# Patient Record
Sex: Female | Born: 1940 | Race: White | Hispanic: No | Smoking: Former smoker
Health system: Southern US, Community
[De-identification: ages and names within clinical notes are randomized; demographics above are authoritative.]

## PROBLEM LIST (undated history)

## (undated) DIAGNOSIS — M1990 Unspecified osteoarthritis, unspecified site: Secondary | ICD-10-CM

## (undated) DIAGNOSIS — F329 Major depressive disorder, single episode, unspecified: Secondary | ICD-10-CM

## (undated) DIAGNOSIS — C801 Malignant (primary) neoplasm, unspecified: Secondary | ICD-10-CM

## (undated) DIAGNOSIS — Z9289 Personal history of other medical treatment: Secondary | ICD-10-CM

## (undated) DIAGNOSIS — I10 Essential (primary) hypertension: Secondary | ICD-10-CM

## (undated) DIAGNOSIS — Z87442 Personal history of urinary calculi: Secondary | ICD-10-CM

## (undated) DIAGNOSIS — D869 Sarcoidosis, unspecified: Secondary | ICD-10-CM

## (undated) DIAGNOSIS — C4491 Basal cell carcinoma of skin, unspecified: Secondary | ICD-10-CM

## (undated) DIAGNOSIS — E669 Obesity, unspecified: Secondary | ICD-10-CM

## (undated) DIAGNOSIS — J189 Pneumonia, unspecified organism: Secondary | ICD-10-CM

## (undated) DIAGNOSIS — G629 Polyneuropathy, unspecified: Secondary | ICD-10-CM

## (undated) DIAGNOSIS — C20 Malignant neoplasm of rectum: Secondary | ICD-10-CM

## (undated) DIAGNOSIS — J45909 Unspecified asthma, uncomplicated: Secondary | ICD-10-CM

## (undated) DIAGNOSIS — F419 Anxiety disorder, unspecified: Secondary | ICD-10-CM

## (undated) DIAGNOSIS — N289 Disorder of kidney and ureter, unspecified: Secondary | ICD-10-CM

## (undated) DIAGNOSIS — F32A Depression, unspecified: Secondary | ICD-10-CM

## (undated) DIAGNOSIS — I447 Left bundle-branch block, unspecified: Secondary | ICD-10-CM

## (undated) DIAGNOSIS — F341 Dysthymic disorder: Secondary | ICD-10-CM

## (undated) DIAGNOSIS — E785 Hyperlipidemia, unspecified: Secondary | ICD-10-CM

## (undated) DIAGNOSIS — Z8249 Family history of ischemic heart disease and other diseases of the circulatory system: Secondary | ICD-10-CM

## (undated) DIAGNOSIS — H269 Unspecified cataract: Secondary | ICD-10-CM

## (undated) DIAGNOSIS — N189 Chronic kidney disease, unspecified: Secondary | ICD-10-CM

## (undated) HISTORY — DX: Unspecified cataract: H26.9

## (undated) HISTORY — PX: EYE SURGERY: SHX253

## (undated) HISTORY — DX: Disorder of kidney and ureter, unspecified: N28.9

## (undated) HISTORY — PX: COLONOSCOPY: SHX174

## (undated) HISTORY — DX: Essential (primary) hypertension: I10

## (undated) HISTORY — DX: Basal cell carcinoma of skin, unspecified: C44.91

## (undated) HISTORY — DX: Family history of ischemic heart disease and other diseases of the circulatory system: Z82.49

## (undated) HISTORY — DX: Dysthymic disorder: F34.1

## (undated) HISTORY — DX: Sarcoidosis, unspecified: D86.9

## (undated) HISTORY — DX: Chronic kidney disease, unspecified: N18.9

## (undated) HISTORY — DX: Hyperlipidemia, unspecified: E78.5

## (undated) HISTORY — DX: Obesity, unspecified: E66.9

## (undated) HISTORY — PX: SIGMOIDOSCOPY: SUR1295

## (undated) HISTORY — DX: Major depressive disorder, single episode, unspecified: F32.9

## (undated) HISTORY — DX: Left bundle-branch block, unspecified: I44.7

## (undated) HISTORY — DX: Depression, unspecified: F32.A

---
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
## 1977-11-18 HISTORY — PX: TUBAL LIGATION: SHX77

## 1984-11-18 HISTORY — PX: APPENDECTOMY: SHX54

## 1984-11-18 HISTORY — PX: ABDOMINAL HYSTERECTOMY: SHX81

## 1993-11-18 HISTORY — PX: KNEE ARTHROSCOPY: SUR90

## 2007-05-27 ENCOUNTER — Ambulatory Visit: Payer: Self-pay | Admitting: Family Medicine

## 2007-05-29 ENCOUNTER — Encounter: Admission: RE | Admit: 2007-05-29 | Discharge: 2007-05-29 | Payer: Self-pay | Admitting: Family Medicine

## 2007-06-01 ENCOUNTER — Ambulatory Visit: Payer: Self-pay | Admitting: Family Medicine

## 2007-06-02 LAB — HM MAMMOGRAPHY: HM Mammogram: NEGATIVE

## 2007-08-17 ENCOUNTER — Ambulatory Visit: Payer: Self-pay | Admitting: Family Medicine

## 2007-09-21 ENCOUNTER — Ambulatory Visit: Payer: Self-pay | Admitting: Family Medicine

## 2007-10-21 ENCOUNTER — Ambulatory Visit: Payer: Self-pay | Admitting: Family Medicine

## 2007-10-30 ENCOUNTER — Ambulatory Visit: Payer: Self-pay | Admitting: Family Medicine

## 2007-11-20 ENCOUNTER — Ambulatory Visit: Payer: Self-pay | Admitting: Family Medicine

## 2007-11-20 ENCOUNTER — Encounter: Admission: RE | Admit: 2007-11-20 | Discharge: 2007-11-20 | Payer: Self-pay | Admitting: Family Medicine

## 2008-02-03 ENCOUNTER — Ambulatory Visit: Payer: Self-pay | Admitting: Family Medicine

## 2008-08-18 ENCOUNTER — Ambulatory Visit: Payer: Self-pay | Admitting: Family Medicine

## 2008-10-20 ENCOUNTER — Ambulatory Visit: Payer: Self-pay | Admitting: Family Medicine

## 2008-12-21 ENCOUNTER — Ambulatory Visit: Payer: Self-pay | Admitting: Family Medicine

## 2010-05-16 ENCOUNTER — Ambulatory Visit: Payer: Self-pay | Admitting: Family Medicine

## 2010-06-21 ENCOUNTER — Ambulatory Visit: Payer: Self-pay | Admitting: Family Medicine

## 2010-06-22 ENCOUNTER — Encounter: Admission: RE | Admit: 2010-06-22 | Discharge: 2010-06-22 | Payer: Self-pay | Admitting: Family Medicine

## 2010-08-21 ENCOUNTER — Ambulatory Visit: Payer: Self-pay | Admitting: Family Medicine

## 2011-05-13 ENCOUNTER — Telehealth: Payer: Self-pay | Admitting: Family Medicine

## 2011-05-13 DIAGNOSIS — I10 Essential (primary) hypertension: Secondary | ICD-10-CM

## 2011-05-13 MED ORDER — BISOPROLOL-HYDROCHLOROTHIAZIDE 10-6.25 MG PO TABS: 1.0000 | ORAL_TABLET | Freq: Every day | ORAL | Status: DC

## 2011-05-14 NOTE — Telephone Encounter (Signed)
RX CALLED IN BY [facility]

## 2011-05-16 ENCOUNTER — Other Ambulatory Visit: Payer: Self-pay | Admitting: *Deleted

## 2011-05-16 DIAGNOSIS — I10 Essential (primary) hypertension: Secondary | ICD-10-CM

## 2011-05-16 MED ORDER — BISOPROLOL-HYDROCHLOROTHIAZIDE 10-6.25 MG PO TABS: 1.0000 | ORAL_TABLET | Freq: Every day | ORAL | Status: DC

## 2011-06-26 ENCOUNTER — Telehealth: Payer: Self-pay | Admitting: Family Medicine

## 2011-06-26 DIAGNOSIS — I10 Essential (primary) hypertension: Secondary | ICD-10-CM

## 2011-06-26 MED ORDER — BISOPROLOL-HYDROCHLOROTHIAZIDE 10-6.25 MG PO TABS: 1.0000 | ORAL_TABLET | Freq: Every day | ORAL | Status: DC

## 2011-06-26 NOTE — Telephone Encounter (Signed)
The med called in

## 2011-07-01 ENCOUNTER — Encounter: Payer: Self-pay | Admitting: Family Medicine

## 2011-07-02 ENCOUNTER — Ambulatory Visit (INDEPENDENT_AMBULATORY_CARE_PROVIDER_SITE_OTHER): Payer: Medicare Other | Admitting: Family Medicine

## 2011-07-02 ENCOUNTER — Encounter: Payer: Self-pay | Admitting: Family Medicine

## 2011-07-02 ENCOUNTER — Encounter: Payer: Self-pay | Admitting: Internal Medicine

## 2011-07-02 VITALS — BP 126/80 | HR 67 | Wt 219.0 lb

## 2011-07-02 DIAGNOSIS — Z8249 Family history of ischemic heart disease and other diseases of the circulatory system: Secondary | ICD-10-CM | POA: Insufficient documentation

## 2011-07-02 DIAGNOSIS — E785 Hyperlipidemia, unspecified: Secondary | ICD-10-CM | POA: Insufficient documentation

## 2011-07-02 DIAGNOSIS — Z79899 Other long term (current) drug therapy: Secondary | ICD-10-CM

## 2011-07-02 DIAGNOSIS — Z1211 Encounter for screening for malignant neoplasm of colon: Secondary | ICD-10-CM

## 2011-07-02 DIAGNOSIS — I10 Essential (primary) hypertension: Secondary | ICD-10-CM

## 2011-07-02 DIAGNOSIS — E669 Obesity, unspecified: Secondary | ICD-10-CM

## 2011-07-02 DIAGNOSIS — F341 Dysthymic disorder: Secondary | ICD-10-CM

## 2011-07-02 NOTE — Progress Notes (Signed)
  Subjective:    Patient ID: Samantha Jenkins, female    DOB: July 06, 1941, 70 y.o.   MRN: [id number]  HPI She is here for a medication recheck. She has had difficulty in the past with statins causing myalgias. She has not tried Crestor. Continues on medications listed in the chart. She has not had a recent mammogram or colonoscopy in the last 10 years. She continues on Zoloft and is not interested in stopping. She feels very comfortable on this medication. She states it helps keep her stable. She has no particular concerns or complaints.   Review of Systems Negative except as above    Objective:   Physical Exam alert and in no distress. Tympanic membranes and canals are normal. Throat is clear. Tonsils are normal. Neck is supple without adenopathy or thyromegaly. Cardiac exam shows a regular sinus rhythm without murmurs or gallops. Lungs are clear to auscultation.        Assessment & Plan:  Hypertension. Hyperlipidemia. Family history of heart disease. Obesity. Dysthymia. Routine blood screening, mammogram and colonoscopy.

## 2011-07-03 ENCOUNTER — Telehealth: Payer: Self-pay

## 2011-07-03 LAB — COMPREHENSIVE METABOLIC PANEL
ALT: 16 U/L (ref 0–35)
AST: 23 U/L (ref 0–37)
Albumin: 4.6 g/dL (ref 3.5–5.2)
Alkaline Phosphatase: 95 U/L (ref 39–117)
BUN: 19 mg/dL (ref 6–23)
CO2: 26 mEq/L (ref 19–32)
Calcium: 10.4 mg/dL (ref 8.4–10.5)
Chloride: 107 mEq/L (ref 96–112)
Creat: 0.78 mg/dL (ref 0.50–1.10)
Glucose, Bld: 104 mg/dL — ABNORMAL HIGH (ref 70–99)
Potassium: 4.1 mEq/L (ref 3.5–5.3)
Sodium: 144 mEq/L (ref 135–145)
Total Bilirubin: 0.8 mg/dL (ref 0.3–1.2)
Total Protein: 7.1 g/dL (ref 6.0–8.3)

## 2011-07-03 LAB — CBC WITH DIFFERENTIAL/PLATELET
Basophils Absolute: 0 10*3/uL (ref 0.0–0.1)
Basophils Relative: 0 % (ref 0–1)
Eosinophils Absolute: 0.1 10*3/uL (ref 0.0–0.7)
Eosinophils Relative: 1 % (ref 0–5)
HCT: 45 % (ref 36.0–46.0)
Hemoglobin: 15 g/dL (ref 12.0–15.0)
Lymphocytes Relative: 29 % (ref 12–46)
Lymphs Abs: 2.1 10*3/uL (ref 0.7–4.0)
MCH: 31.8 pg (ref 26.0–34.0)
MCHC: 33.3 g/dL (ref 30.0–36.0)
MCV: 95.3 fL (ref 78.0–100.0)
Monocytes Absolute: 0.5 10*3/uL (ref 0.1–1.0)
Monocytes Relative: 7 % (ref 3–12)
Neutro Abs: 4.5 10*3/uL (ref 1.7–7.7)
Neutrophils Relative %: 63 % (ref 43–77)
Platelets: 305 10*3/uL (ref 150–400)
RBC: 4.72 MIL/uL (ref 3.87–5.11)
RDW: 14.1 % (ref 11.5–15.5)
WBC: 7.2 10*3/uL (ref 4.0–10.5)

## 2011-07-03 LAB — LIPID PANEL
Cholesterol: 246 mg/dL — ABNORMAL HIGH (ref 0–200)
HDL: 54 mg/dL (ref >39)
LDL Cholesterol: 162 mg/dL — ABNORMAL HIGH (ref 0–99)
Total CHOL/HDL Ratio: 4.6 Ratio
Triglycerides: 151 mg/dL — ABNORMAL HIGH (ref <150)
VLDL: 30 mg/dL (ref 0–40)

## 2011-07-03 NOTE — Telephone Encounter (Signed)
Called pt and left message labs look good and mailed her a copy

## 2011-07-04 ENCOUNTER — Telehealth: Payer: Self-pay

## 2011-07-04 NOTE — Telephone Encounter (Signed)
Let pt know paper ready to pick up

## 2011-07-18 ENCOUNTER — Encounter: Payer: Self-pay | Admitting: Internal Medicine

## 2011-07-18 ENCOUNTER — Ambulatory Visit (AMBULATORY_SURGERY_CENTER): Payer: Medicare Other | Admitting: *Deleted

## 2011-07-18 VITALS — Ht 65.0 in | Wt 220.6 lb

## 2011-07-18 DIAGNOSIS — Z1211 Encounter for screening for malignant neoplasm of colon: Secondary | ICD-10-CM

## 2011-07-18 MED ORDER — SUPREP BOWEL PREP KIT 17.5-3.13-1.6 GM/177ML PO SOLN: 1.0000 | Freq: Once | ORAL | Status: DC

## 2011-07-27 ENCOUNTER — Other Ambulatory Visit: Payer: Self-pay | Admitting: Family Medicine

## 2011-07-31 ENCOUNTER — Other Ambulatory Visit: Payer: Medicare Other | Admitting: Internal Medicine

## 2011-09-04 ENCOUNTER — Other Ambulatory Visit: Payer: Self-pay | Admitting: Family Medicine

## 2011-10-23 ENCOUNTER — Other Ambulatory Visit: Payer: Self-pay | Admitting: Family Medicine

## 2011-10-23 NOTE — Telephone Encounter (Signed)
Is this ok?

## 2011-11-11 ENCOUNTER — Encounter: Payer: Self-pay | Admitting: Family Medicine

## 2011-11-11 ENCOUNTER — Ambulatory Visit (INDEPENDENT_AMBULATORY_CARE_PROVIDER_SITE_OTHER): Payer: Medicare Other | Admitting: Family Medicine

## 2011-11-11 VITALS — BP 140/80 | HR 85 | Temp 98.3°F | Resp 14 | Wt 216.0 lb

## 2011-11-11 DIAGNOSIS — J45909 Unspecified asthma, uncomplicated: Secondary | ICD-10-CM

## 2011-11-11 MED ORDER — AZITHROMYCIN 250 MG PO TABS: ORAL_TABLET | ORAL | Status: AC

## 2011-11-11 MED ORDER — ALBUTEROL SULFATE HFA 108 (90 BASE) MCG/ACT IN AERS: 2.0000 | INHALATION_SPRAY | Freq: Four times a day (QID) | RESPIRATORY_TRACT | Status: DC | PRN

## 2011-11-11 NOTE — Patient Instructions (Signed)
Drink plenty of fluids.  Continue Mucinex to loosen phlegm and advil for fever control.  You may use a cough suppressant such as Delsym (as long as your Mucinex is the plain kind, and not DM) Use the inhaler if needed for wheezing.  Follow up here if increasing shortness of breath, ongoing fevers, or other concerns

## 2011-11-11 NOTE — Progress Notes (Signed)
Patient complains of cough x 2-3 days.  Cough was dry at first, but now is rattling, and having burning in her upper chest when she coughs.  Unable to expectorate phlegm, but feels like she needs to, sounds wet.  Has been using Mucinex (which makes her thirsty, so having increased urinary frequency due to increased fluid intake).  Last night had coughing spells where she felt like she couldn't catch her breath.  Had fever of 102 yesterday.  Denies runny nose, sinus pressure, ear pain.  Slight sore throat.  Denies sick contacts. She used to get pneumonia frequently as a child, and as an adult gets bronchitis frequently.  Didn't get a flu shot yet this year.  Past Medical History  Diagnosis Date  . Hypertension   . Vitamin D deficiency   . Hyperlipidemia   . Sarcoid   . Dysthymia   . Obesity   . LBBB (left bundle branch block)   . FHx: cardiovascular disease   . BCE (basal cell epithelioma)     Past Surgical History  Procedure Date  . Abdominal hysterectomy 1986  . Knee arthroscopy 1995    left  . Tubal ligation 1979  . Tonsillectomy 1945    History   Social History  . Marital Status: Divorced    Spouse Name: N/A    Number of Children: N/A  . Years of Education: N/A   Occupational History  . Not on file.   Social History Main Topics  . Smoking status: Former Smoker    Quit date: 07/18/1967  . Smokeless tobacco: Never Used  . Alcohol Use: 1.8 oz/week    3 Glasses of wine per week  . Drug Use: No  . Sexually Active: Not on file   Other Topics Concern  . Not on file   Social History Narrative  . No narrative on file    Family History  Problem Relation Age of Onset  . Colon cancer Neg Hx   . Stomach cancer Neg Hx   . Rectal cancer Neg Hx     Current outpatient prescriptions:aspirin 81 MG tablet, Take 81 mg by mouth daily.  , Disp: , Rfl: ;  bisoprolol-hydrochlorothiazide (ZIAC) 5-6.25 MG per tablet, Take 1 tablet by mouth daily.  , Disp: , Rfl: ;  Cholecalciferol  (VITAMIN D) 2000 UNITS CAPS, Take by mouth.  , Disp: , Rfl: ;  ibuprofen (ADVIL,MOTRIN) 200 MG tablet, Take 200 mg by mouth every 6 (six) hours as needed.  , Disp: , Rfl:  Ibuprofen-Diphenhydramine Cit (ADVIL PM PO), Take by mouth. As needed for sleep , Disp: , Rfl: ;  pravastatin (PRAVACHOL) 20 MG tablet, Take 20 mg by mouth daily.  , Disp: , Rfl: ;  sertraline (ZOLOFT) 100 MG tablet, TAKE 1 TABLET BY MOUTH EVERY DAY, Disp: 30 tablet, Rfl: 5;  calcium-vitamin D (OSCAL WITH D) 250-125 MG-UNIT per tablet, Take 1 tablet by mouth daily.  , Disp: , Rfl:   Allergies  Allergen Reactions  . Statins     Muscle aches  . Sulfa Antibiotics Other (See Comments)    Blood in urine   ROS: Denies nausea, vomiting, diarrhea, skin rashes, joint pains or other problems.  Denies myalgias.   BP 140/80  Pulse 85  Temp(Src) 98.3 F (36.8 C) (Oral)  Resp 14  Wt 216 lb (97.977 kg)  SpO2 95%  HEENT:  PERRL, EOMI, conjunctiva clear.  TM's and EAC's normal.  OP clear.  Nasal mucosa mildly edematous with clear mucus.  Sinuses nontender Neck: no lymphadenopathy or mass Heart: regular rate and rhythm without murmur Lungs: coarse breath sounds, more on L than R.  Good air movement.  Some wheezing on L noted Skin: no rash Psych: normal mood, affect, hygiene and grooming  ASSESSMENT/PLAN: 1. Asthmatic bronchitis  azithromycin (ZITHROMAX) 250 MG tablet, albuterol (PROVENTIL HFA;VENTOLIN HFA) 108 (90 BASE) MCG/ACT inhaler   zpak Albuterol MDI prn  F/u prn

## 2012-07-29 ENCOUNTER — Other Ambulatory Visit: Payer: Self-pay | Admitting: Family Medicine

## 2012-07-29 NOTE — Telephone Encounter (Signed)
Patient needs refill sertraline 100mg   [facility]

## 2012-07-30 ENCOUNTER — Other Ambulatory Visit: Payer: Self-pay

## 2012-07-30 MED ORDER — SERTRALINE HCL 100 MG PO TABS: 100.0000 mg | ORAL_TABLET | Freq: Every day | ORAL | Status: DC

## 2012-07-30 NOTE — Telephone Encounter (Signed)
Have her set up a followup appointment 

## 2012-07-30 NOTE — Telephone Encounter (Signed)
Sent med in 

## 2012-09-11 ENCOUNTER — Telehealth: Payer: Self-pay | Admitting: Internal Medicine

## 2012-09-11 ENCOUNTER — Other Ambulatory Visit: Payer: Self-pay | Admitting: Medical

## 2012-09-11 MED ORDER — SERTRALINE HCL 100 MG PO TABS: 100.0000 mg | ORAL_TABLET | Freq: Every day | ORAL | Status: DC

## 2012-09-11 NOTE — Telephone Encounter (Signed)
Pt is coming in for a med check October 29 but has ran out of zoloft 100mg  and needs a refill to get her by til then. [facility] [address]

## 2012-09-14 NOTE — Telephone Encounter (Signed)
done

## 2012-09-15 ENCOUNTER — Ambulatory Visit (INDEPENDENT_AMBULATORY_CARE_PROVIDER_SITE_OTHER): Payer: Medicare Other | Admitting: Family Medicine

## 2012-09-15 ENCOUNTER — Encounter: Payer: Self-pay | Admitting: Family Medicine

## 2012-09-15 VITALS — BP 130/82 | HR 60 | Ht 65.0 in | Wt 216.0 lb

## 2012-09-15 DIAGNOSIS — Z79899 Other long term (current) drug therapy: Secondary | ICD-10-CM

## 2012-09-15 DIAGNOSIS — E785 Hyperlipidemia, unspecified: Secondary | ICD-10-CM

## 2012-09-15 DIAGNOSIS — E559 Vitamin D deficiency, unspecified: Secondary | ICD-10-CM

## 2012-09-15 DIAGNOSIS — F341 Dysthymic disorder: Secondary | ICD-10-CM

## 2012-09-15 DIAGNOSIS — E669 Obesity, unspecified: Secondary | ICD-10-CM

## 2012-09-15 DIAGNOSIS — Z23 Encounter for immunization: Secondary | ICD-10-CM

## 2012-09-15 DIAGNOSIS — I10 Essential (primary) hypertension: Secondary | ICD-10-CM

## 2012-09-15 DIAGNOSIS — Z8249 Family history of ischemic heart disease and other diseases of the circulatory system: Secondary | ICD-10-CM

## 2012-09-15 LAB — COMPREHENSIVE METABOLIC PANEL
ALT: 16 U/L (ref 0–35)
AST: 19 U/L (ref 0–37)
Albumin: 4.3 g/dL (ref 3.5–5.2)
Alkaline Phosphatase: 109 U/L (ref 39–117)
BUN: 15 mg/dL (ref 6–23)
CO2: 26 mEq/L (ref 19–32)
Calcium: 10.2 mg/dL (ref 8.4–10.5)
Chloride: 105 mEq/L (ref 96–112)
Creat: 0.66 mg/dL (ref 0.50–1.10)
Glucose, Bld: 89 mg/dL (ref 70–99)
Potassium: 4.1 mEq/L (ref 3.5–5.3)
Sodium: 140 mEq/L (ref 135–145)
Total Bilirubin: 0.7 mg/dL (ref 0.3–1.2)
Total Protein: 7 g/dL (ref 6.0–8.3)

## 2012-09-15 LAB — LIPID PANEL
Cholesterol: 211 mg/dL — ABNORMAL HIGH (ref 0–200)
HDL: 54 mg/dL (ref >39)
LDL Cholesterol: 121 mg/dL — ABNORMAL HIGH (ref 0–99)
Total CHOL/HDL Ratio: 3.9 Ratio
Triglycerides: 179 mg/dL — ABNORMAL HIGH (ref <150)
VLDL: 36 mg/dL (ref 0–40)

## 2012-09-15 LAB — CBC WITH DIFFERENTIAL/PLATELET
Basophils Absolute: 0 10*3/uL (ref 0.0–0.1)
Basophils Relative: 0 % (ref 0–1)
Eosinophils Absolute: 0.1 10*3/uL (ref 0.0–0.7)
Eosinophils Relative: 2 % (ref 0–5)
HCT: 44 % (ref 36.0–46.0)
Hemoglobin: 14.7 g/dL (ref 12.0–15.0)
Lymphocytes Relative: 28 % (ref 12–46)
Lymphs Abs: 1.8 10*3/uL (ref 0.7–4.0)
MCH: 31.6 pg (ref 26.0–34.0)
MCHC: 33.4 g/dL (ref 30.0–36.0)
MCV: 94.6 fL (ref 78.0–100.0)
Monocytes Absolute: 0.6 10*3/uL (ref 0.1–1.0)
Monocytes Relative: 9 % (ref 3–12)
Neutro Abs: 3.9 10*3/uL (ref 1.7–7.7)
Neutrophils Relative %: 61 % (ref 43–77)
Platelets: 262 10*3/uL (ref 150–400)
RBC: 4.65 MIL/uL (ref 3.87–5.11)
RDW: 13.5 % (ref 11.5–15.5)
WBC: 6.4 10*3/uL (ref 4.0–10.5)

## 2012-09-15 MED ORDER — INFLUENZA VIRUS VACC SPLIT PF IM SUSP: 0.5000 mL | Freq: Once | INTRAMUSCULAR | Status: DC

## 2012-09-15 NOTE — Progress Notes (Signed)
  Subjective:    Patient ID: Samantha Jenkins, female    DOB: 25-Sep-1941, 71 y.o.   MRN: [id number]  HPI She is here for medication check. She continues on medications listed in the chart for her blood pressure as well as lipids and dysthymia. She is having no difficulty with these medications and would like to stay on them. She states that she has lost a slight amount of weight and plans to continue to do this. He also has a history of vitamin D deficiency and is on vitamin D supplementation. She uses the Advil PM very sparingly. She has seen her cardiologist within the last year.   Review of Systems     Objective:   Physical Exam alert and in no distress. Tympanic membranes and canals are normal. Throat is clear. Tonsils are normal. Neck is supple without adenopathy or thyromegaly. Cardiac exam shows a regular sinus rhythm without murmurs or gallops. Lungs are clear to auscultation.        Assessment & Plan:   1. Need for prophylactic vaccination and inoculation against influenza  influenza  inactive virus vaccine (FLUZONE/FLUARIX) injection 0.5 mL  2. Obesity (BMI 30-39.9)  Lipid panel, CBC with Differential, Comprehensive metabolic panel  3. Hypertension  CBC with Differential, Comprehensive metabolic panel  4. Hyperlipidemia LDL goal <100  Lipid panel  5. Dysthymia    6. Family history of heart disease in female family member before age 23  Lipid panel, CBC with Differential, Comprehensive metabolic panel  7. Vitamin D deficiency  Vitamin D 25 hydroxy  8. Encounter for long-term (current) use of other medications  Lipid panel, CBC with Differential, Comprehensive metabolic panel   flu shot given with risks and benefits discussed I encouraged her to continue with her weight loss program. She is not interested in having another colonoscopy.

## 2012-09-16 LAB — VITAMIN D 25 HYDROXY (VIT D DEFICIENCY, FRACTURES): Vit D, 25-Hydroxy: 29 ng/mL — ABNORMAL LOW (ref 30–89)

## 2012-10-09 ENCOUNTER — Telehealth: Payer: Self-pay | Admitting: Internal Medicine

## 2012-10-09 MED ORDER — SERTRALINE HCL 100 MG PO TABS: 100.0000 mg | ORAL_TABLET | Freq: Every day | ORAL | Status: DC

## 2012-10-09 NOTE — Telephone Encounter (Signed)
Pt needed a refill on zoloft sent to [facility]

## 2012-11-18 DIAGNOSIS — I447 Left bundle-branch block, unspecified: Secondary | ICD-10-CM

## 2012-11-18 HISTORY — DX: Left bundle-branch block, unspecified: I44.7

## 2013-03-09 ENCOUNTER — Telehealth: Payer: Self-pay | Admitting: Family Medicine

## 2013-03-09 MED ORDER — SERTRALINE HCL 100 MG PO TABS: 100.0000 mg | ORAL_TABLET | Freq: Every day | ORAL | Status: DC

## 2013-03-09 NOTE — Telephone Encounter (Signed)
Pt called and stated she was completely out of zoloft she would like a 30 day supply called into a local pharmacy [facility] [address], and a 90 day supply sent into her mail order [facility].

## 2013-03-09 NOTE — Telephone Encounter (Signed)
Zoloft renewed.

## 2013-03-09 NOTE — Telephone Encounter (Signed)
Is this ok?

## 2013-03-22 ENCOUNTER — Other Ambulatory Visit: Payer: Self-pay | Admitting: *Deleted

## 2013-03-22 DIAGNOSIS — F32A Depression, unspecified: Secondary | ICD-10-CM

## 2013-03-22 DIAGNOSIS — F329 Major depressive disorder, single episode, unspecified: Secondary | ICD-10-CM

## 2013-03-22 MED ORDER — SERTRALINE HCL 100 MG PO TABS: 100.0000 mg | ORAL_TABLET | Freq: Every day | ORAL | Status: DC

## 2013-04-06 ENCOUNTER — Encounter: Payer: Self-pay | Admitting: Emergency Medicine

## 2013-04-08 ENCOUNTER — Ambulatory Visit (INDEPENDENT_AMBULATORY_CARE_PROVIDER_SITE_OTHER): Payer: Medicare Other | Admitting: Internal Medicine

## 2013-04-08 ENCOUNTER — Other Ambulatory Visit: Payer: Self-pay | Admitting: *Deleted

## 2013-04-08 ENCOUNTER — Encounter: Payer: Self-pay | Admitting: Internal Medicine

## 2013-04-08 VITALS — BP 158/88 | HR 54 | Ht 66.0 in | Wt 220.0 lb

## 2013-04-08 DIAGNOSIS — E8881 Metabolic syndrome: Secondary | ICD-10-CM

## 2013-04-08 DIAGNOSIS — E782 Mixed hyperlipidemia: Secondary | ICD-10-CM

## 2013-04-08 DIAGNOSIS — I447 Left bundle-branch block, unspecified: Secondary | ICD-10-CM | POA: Insufficient documentation

## 2013-04-08 DIAGNOSIS — Z79899 Other long term (current) drug therapy: Secondary | ICD-10-CM

## 2013-04-08 DIAGNOSIS — Z8249 Family history of ischemic heart disease and other diseases of the circulatory system: Secondary | ICD-10-CM

## 2013-04-08 DIAGNOSIS — E785 Hyperlipidemia, unspecified: Secondary | ICD-10-CM

## 2013-04-08 DIAGNOSIS — E669 Obesity, unspecified: Secondary | ICD-10-CM

## 2013-04-08 DIAGNOSIS — I10 Essential (primary) hypertension: Secondary | ICD-10-CM

## 2013-04-08 DIAGNOSIS — F341 Dysthymic disorder: Secondary | ICD-10-CM

## 2013-04-08 MED ORDER — BISOPROLOL-HYDROCHLOROTHIAZIDE 5-6.25 MG PO TABS: 1.0000 | ORAL_TABLET | Freq: Every day | ORAL | Status: DC

## 2013-04-08 MED ORDER — PRAVASTATIN SODIUM 40 MG PO TABS: 40.0000 mg | ORAL_TABLET | Freq: Every day | ORAL | Status: DC

## 2013-04-08 NOTE — Patient Instructions (Addendum)
Your physician recommends that you continue on your current medications as directed. Please refer to the Current Medication list given to you today.   Your physician wants you to follow-up in: 12 months. You will receive a reminder letter in the mail two months in advance. If you don't receive a letter, please call our office to schedule the follow-up appointment.  Your physician recommends that you go for lab work  -CMP,NMR with lipid  Cardiac Diet A cardiac diet can help stop heart disease or a stroke from happening. It involves eating less unhealthy fats and eating more healthy fats.  FOODS TO AVOID OR LIMIT  Limit saturated fats. This type of fat is found in oils and dairy products, such as:  Coconut oil.  Palm oil.  Cocoa butter.  Butter.  Avoid trans-fat or hydrogenated oils. These are found in fried or pre-made baked goods, such as:  Margarine.  Pre-made cookies, cakes, and crackers.  Limit processed meats (hot dogs, deli meats, sausage) to 3 ounces a week.  Limit high-fat meats (marbled meats, fried chicken, or chicken with skin) to 3 ounces a week.  Limit salt (sodium) to 1500 milligrams a day.   Limit sweets and drinks with added sugar to no more than 5 servings a week. One serving is:  1 tablespoon of sugar.  1 tablespoon of jelly or jam.   cup sorbet.  1 cup lemonade.   cup regular soda. EAT MORE OF THE FOLLOWING FOODS Fruit  Eat 4to 5 servings a day. One serving of fruit is:  1 medium whole fruit.   cup dried fruit.   cup of fresh, frozen, or canned fruit.   cup 100% fruit juice. Vegetables  Eat 4 to 5 servings a day. One serving is:  1 cup raw leafy vegetables.   cup raw or cooked, cut-up vegetables.   cup vegetable juice. Whole Grains  Eat 3 servings a day (1 ounce equals 1 serving). Legumes (such as beans, peas, and lentils)   Eat at least 4 servings a week ( cup equals 1 serving). Nuts and Seeds   Eat at least 4  servings a week ( cup equals 1 serving). Dietary Fiber  Eat 20 to 30 grams a day. Some foods high in dietary fiber include:  Dried beans.  Citrus fruits.  Apples, bananas.  Broccoli, Brussels sprouts, and eggplant.  Oats. Omega-3 Fats  Eat food with omega-3 fats. You can also take a dietary pill (supplement) that has 1 gram of DHA and EPA. Have 3.5 ounces of fatty fish a week, such as:  Salmon.  Mackerel.  [place]acore tuna.  Sardines.  Lake trout.  Herring. PREPARING YOUR FOOD  Broil, bake, steam, or roast foods. Do not fry food. Do not cook food in butter (fat).  Use non-stick cooking sprays.  Remove skin from poultry, such as chicken and [place].  Remove fat from meat.  Take the fat off the top of stews, soups, and gravy.  Use lemon or herbs to flavor food instead of using butter or margarine.  Use nonfat yogurt, salsa, or low-fat dressings for salads. Document Released: 05/05/2012 Document Reviewed: 05/05/2012 [facility] Patient Information 2014 [place], [place].

## 2013-04-08 NOTE — Progress Notes (Signed)
OFFICE NOTE  Chief Complaint:  Routine followup visit  Primary Care Physician: Carollee Herter, MD  HPI:  Samantha Jenkins is a 72 year old female with a history of osteoarthritis, hypertension, and mild depression; however, this has been fairly stable. She does have a history of a left bundle-branch block which is chronic and a prior nuclear stress test which was negative for ischemia, and she has no associated symptoms such as chest pain, shortness of breath, presyncope, syncope, palpitations, or dizziness. She also has dyslipidemia and has been intolerant to a number of statin medications before including Mevacor, Zocor, Welchol, Lipitor. In addition, she has hypertension which has been fairly well controlled. At her last office visit, we started her on pravastatin 40 mg daily, and she reports being able to tolerate that fairly well. I noted that you discovered she was severely low in vitamin D, which seems to been repleted. Overall she's been feeling well, except for the other day she tripped and fell at a gas station. She did strike her head and subsequently developed ecchymosis around the left orbit. She additionally complains of left-sided chest wall pain without bruising, and is concerned that she may have fractured a rib.  Nevertheless, she did not present for evaluation by a physician and is certainly outside of the window where serious complications from head injury would have been present.  PMHx:  Past Medical History  Diagnosis Date  . Hypertension   . Vitamin D deficiency   . Hyperlipidemia   . Sarcoid   . Dysthymia   . Obesity   . LBBB (left bundle branch block)   . FHx: cardiovascular disease   . BCE (basal cell epithelioma)   . Depression     Past Surgical History  Procedure Laterality Date  . Abdominal hysterectomy  1986  . Knee arthroscopy  1995    left  . Tubal ligation  1979  . Tonsillectomy  1945    FAMHx:  Family History  Problem Relation Age of Onset    . Colon cancer Neg Hx   . Stomach cancer Neg Hx   . Rectal cancer Neg Hx     SOCHx:   reports that she quit smoking about 45 years ago. She has never used smokeless tobacco. She reports that she drinks about 1.8 ounces of alcohol per week. She reports that she does not use illicit drugs.  ALLERGIES:  Allergies  Allergen Reactions  . Statins     Muscle aches  . Sulfa Antibiotics Other (See Comments)    Blood in urine    ROS: A comprehensive review of systems was negative except for: Constitutional: positive for weight gain Eyes: positive for Left periorbital ecchymosis Cardiovascular: positive for Chest wall pain Hematologic/lymphatic: positive for easy bruising Musculoskeletal: positive for Rib pain  HOME MEDS: Current Outpatient Prescriptions  Medication Sig Dispense Refill  . aspirin 81 MG tablet Take 81 mg by mouth daily.        . bisoprolol-hydrochlorothiazide (ZIAC) 5-6.25 MG per tablet Take 1 tablet by mouth daily.        . calcium-vitamin D (OSCAL WITH D) 250-125 MG-UNIT per tablet Take 1 tablet by mouth daily.        . Cholecalciferol (VITAMIN D) 2000 UNITS CAPS Take by mouth.        Marland Kitchen ibuprofen (ADVIL,MOTRIN) 200 MG tablet Take 200 mg by mouth every 6 (six) hours as needed.        . Ibuprofen-Diphenhydramine Cit (ADVIL PM PO) Take by mouth.  As needed for sleep       . pravastatin (PRAVACHOL) 40 MG tablet Take 40 mg by mouth daily.      . sertraline (ZOLOFT) 100 MG tablet Take 1 tablet (100 mg total) by mouth daily.  30 tablet  0   No current facility-administered medications for this visit.    LABS/IMAGING: No results found for this or any previous visit (from the past 48 hour(s)). No results found.  VITALS: BP 158/88  Pulse 54  Ht 5\' 6"  (1.676 m)  Wt 220 lb (99.791 kg)  BMI 35.53 kg/m2  EXAM: General appearance: alert and no distress Neck: no adenopathy, no carotid bruit, no JVD, supple, symmetrical, trachea midline and thyroid not enlarged, symmetric,  no tenderness/mass/nodules Lungs: clear to auscultation bilaterally Heart: regular rate and rhythm, S1, S2 normal, no murmur, click, rub or gallop Abdomen: soft, non-tender; bowel sounds normal; no masses,  no organomegaly and Obese Extremities: extremities normal, atraumatic, no cyanosis or edema and Left orbital ecchymosis, tenderness to palpation over the left anterior axillary line, no crepitus with inspiration or expiration Pulses: 2+ and symmetric Skin: Skin color, texture, turgor normal. No rashes or lesions Neurologic: Grossly normal, cranial nerves II through XII intact, strength 5/5 bilaterally upper and lower extremity, normal extraocular eye movements  EKG: Sinus bradycardia with left bundle branch block, QRS duration 156 ms, heart rate 54  ASSESSMENT: 1. Chronic left bundle branch block with a wide QRS 2. Left perioral ecchymosis secondary to head injury 3. Left anterior axillary line chest wall tenderness 4. Metabolic syndrome 5. Hyperlipidemia 6. Hypertension  PLAN: 1.   Overall Ms. Carver is doing well except for recent fall with some head trauma. She denies any loss of consciousness and has no focal neurologic deficits. Cranial nerves are intact. She does have some left anterior basilar wall tenderness, which may indicate a deep bruise or perhaps a rib fracture. I do not see any advantage of chest x-ray at this time. If her chest pain does not improve within one month then she should consider chest x-ray to look for incomplete healing. We'll go ahead and obtain laboratory work as well as a recheck of her cholesterol profile today. She's inquired about Mediterranean diet and we have pointed or in the direction of where she can get more information about that diet. Plan to see her back in the office annually.  Chrystie Nose, MD, [facility] Attending Cardiologist [facility]  Ambert Virrueta C 04/08/2013, 11:14 AM

## 2013-04-09 NOTE — Telephone Encounter (Signed)
PATIENT REQUEST REFILL. E-SCRBED MEDICATION

## 2013-08-19 ENCOUNTER — Ambulatory Visit (INDEPENDENT_AMBULATORY_CARE_PROVIDER_SITE_OTHER): Payer: Medicare Other | Admitting: Family Medicine

## 2013-08-19 ENCOUNTER — Encounter: Payer: Self-pay | Admitting: Family Medicine

## 2013-08-19 VITALS — BP 124/80 | HR 60 | Wt 218.0 lb

## 2013-08-19 DIAGNOSIS — I10 Essential (primary) hypertension: Secondary | ICD-10-CM

## 2013-08-19 DIAGNOSIS — E785 Hyperlipidemia, unspecified: Secondary | ICD-10-CM

## 2013-08-19 DIAGNOSIS — F341 Dysthymic disorder: Secondary | ICD-10-CM

## 2013-08-19 DIAGNOSIS — F32A Depression, unspecified: Secondary | ICD-10-CM

## 2013-08-19 DIAGNOSIS — Z8249 Family history of ischemic heart disease and other diseases of the circulatory system: Secondary | ICD-10-CM

## 2013-08-19 DIAGNOSIS — E8881 Metabolic syndrome: Secondary | ICD-10-CM

## 2013-08-19 DIAGNOSIS — F329 Major depressive disorder, single episode, unspecified: Secondary | ICD-10-CM

## 2013-08-19 DIAGNOSIS — Z23 Encounter for immunization: Secondary | ICD-10-CM

## 2013-08-19 DIAGNOSIS — E669 Obesity, unspecified: Secondary | ICD-10-CM

## 2013-08-19 LAB — CBC WITH DIFFERENTIAL/PLATELET
Basophils Absolute: 0 10*3/uL (ref 0.0–0.1)
Basophils Relative: 0 % (ref 0–1)
Eosinophils Absolute: 0.1 10*3/uL (ref 0.0–0.7)
Eosinophils Relative: 2 % (ref 0–5)
HCT: 42 % (ref 36.0–46.0)
Hemoglobin: 14.8 g/dL (ref 12.0–15.0)
Lymphocytes Relative: 26 % (ref 12–46)
Lymphs Abs: 1.5 10*3/uL (ref 0.7–4.0)
MCH: 32.7 pg (ref 26.0–34.0)
MCHC: 35.2 g/dL (ref 30.0–36.0)
MCV: 92.9 fL (ref 78.0–100.0)
Monocytes Absolute: 0.4 10*3/uL (ref 0.1–1.0)
Monocytes Relative: 7 % (ref 3–12)
Neutro Abs: 3.8 10*3/uL (ref 1.7–7.7)
Neutrophils Relative %: 65 % (ref 43–77)
Platelets: 280 10*3/uL (ref 150–400)
RBC: 4.52 MIL/uL (ref 3.87–5.11)
RDW: 14.3 % (ref 11.5–15.5)
WBC: 5.9 10*3/uL (ref 4.0–10.5)

## 2013-08-19 LAB — COMPREHENSIVE METABOLIC PANEL
ALT: 14 U/L (ref 0–35)
AST: 19 U/L (ref 0–37)
Albumin: 3.8 g/dL (ref 3.5–5.2)
Alkaline Phosphatase: 96 U/L (ref 39–117)
BUN: 15 mg/dL (ref 6–23)
CO2: 26 mEq/L (ref 19–32)
Calcium: 9.5 mg/dL (ref 8.4–10.5)
Chloride: 107 mEq/L (ref 96–112)
Creat: 0.64 mg/dL (ref 0.50–1.10)
Glucose, Bld: 101 mg/dL — ABNORMAL HIGH (ref 70–99)
Potassium: 3.8 mEq/L (ref 3.5–5.3)
Sodium: 141 mEq/L (ref 135–145)
Total Bilirubin: 0.8 mg/dL (ref 0.3–1.2)
Total Protein: 6.5 g/dL (ref 6.0–8.3)

## 2013-08-19 LAB — LIPID PANEL
Cholesterol: 174 mg/dL (ref 0–200)
HDL: 50 mg/dL (ref >39)
LDL Cholesterol: 100 mg/dL — ABNORMAL HIGH (ref 0–99)
Total CHOL/HDL Ratio: 3.5 Ratio
Triglycerides: 119 mg/dL (ref <150)
VLDL: 24 mg/dL (ref 0–40)

## 2013-08-19 MED ORDER — SERTRALINE HCL 100 MG PO TABS: 100.0000 mg | ORAL_TABLET | Freq: Every day | ORAL | Status: DC

## 2013-08-19 MED ORDER — PRAVASTATIN SODIUM 40 MG PO TABS: 40.0000 mg | ORAL_TABLET | Freq: Every day | ORAL | Status: DC

## 2013-08-19 MED ORDER — BISOPROLOL-HYDROCHLOROTHIAZIDE 5-6.25 MG PO TABS: 1.0000 | ORAL_TABLET | Freq: Every day | ORAL | Status: DC

## 2013-08-19 NOTE — Progress Notes (Signed)
  Subjective:    Patient ID: Samantha Jenkins, female    DOB: 08-18-1941, 72 y.o.   MRN: [id number]  HPI She is here for a general medication check. She continues on her blood pressure medication and is having no difficulty with this. Review his record indicates elevated blood sugar. She continues on Pravachol and is having no difficulty with this. She also takes Ziac for her blood pressure. She does get followup with cardiology yearly. There is a family history of heart disease. She did smoke over 45 years ago. She continues to do well on her Zoloft for treatment of an underlying dysthymia. She states that it helps keep her calm. She states that she does not handle stress well. She does not exercise regularly. Social and family history were reviewed and are unchanged.  Review of Systems Negative except as above    Objective:   Physical Exam alert and in no distress. Tympanic membranes and canals are normal. Throat is clear. Tonsils are normal. Neck is supple without adenopathy or thyromegaly. Cardiac exam shows a regular sinus rhythm without murmurs or gallops. Lungs are clear to auscultation.        Assessment & Plan:  Immunization due - Plan: Pneumococcal polysaccharide vaccine 23-valent greater than or equal to 2yo subcutaneous/IM  Need for prophylactic vaccination and inoculation against influenza - Plan: Flu Vaccine QUAD 36+ mos IM  Obesity (BMI 30-39.9) - Plan: CBC with Differential, Comprehensive metabolic panel, Lipid panel  Metabolic syndrome - Plan: CBC with Differential, Comprehensive metabolic panel, Lipid panel  Hypertension - Plan: CBC with Differential, Comprehensive metabolic panel, bisoprolol-hydrochlorothiazide (ZIAC) 5-6.25 MG per tablet  Hyperlipidemia LDL goal <100 - Plan: Lipid panel, pravastatin (PRAVACHOL) 40 MG tablet  Family history of heart disease in female family member before age 58  Dysthymia - Plan: sertraline (ZOLOFT) 100 MG tablet  Depression - Plan:  sertraline (ZOLOFT) 100 MG tablet  pneumococcal vaccine as well as flu shot given with risks and benefits discussed. Routine blood screening. Renew Zoloft and Pravachol as well as a Ziac. Discussed colonoscopy with her and at this point she is not interested. She indeed did get a shingles vaccine several years ago. 40 minutes spent discussing all these issues with her

## 2013-09-29 ENCOUNTER — Encounter: Payer: Self-pay | Admitting: *Deleted

## 2013-11-06 ENCOUNTER — Telehealth: Payer: Self-pay | Admitting: Family Medicine

## 2013-11-06 NOTE — Telephone Encounter (Signed)
lm

## 2014-02-02 ENCOUNTER — Ambulatory Visit (INDEPENDENT_AMBULATORY_CARE_PROVIDER_SITE_OTHER): Payer: Commercial Managed Care - HMO | Admitting: Family Medicine

## 2014-02-02 ENCOUNTER — Encounter: Payer: Self-pay | Admitting: Family Medicine

## 2014-02-02 VITALS — BP 130/80 | HR 64 | Wt 217.0 lb

## 2014-02-02 DIAGNOSIS — M79642 Pain in left hand: Secondary | ICD-10-CM

## 2014-02-02 DIAGNOSIS — M79609 Pain in unspecified limb: Secondary | ICD-10-CM

## 2014-02-02 NOTE — Progress Notes (Signed)
   Subjective:    Patient ID: Samantha Jenkins, female    DOB: April 24, 1941, 73 y.o.   MRN: [id number]  HPI She is here for evaluation of a one-month history of bilateral hand numbness. She does state that it occurs more on the left than on the right. She states it is all fingers. She cannot relate this to wrist or elbow position. She's had no neck pain. She does note that when she reads in bed and gets these symptoms and then shakes her hands, symptoms go a fairly quickly.   Review of Systems     Objective:   Physical Exam Exam of both hands shows normal strength and sensation. Tinel and Phalen's test negative.       Assessment & Plan:  Hand pain, left  I explained that most likely her symptoms are related to carpal tunnel but also discussed ulnar nerve issues as well as neck trouble. Recommend she keep better track of these symptoms to see which if anything ears are involved and wrist as well as elbow position. Discussed keeping her wrist in the neutral position.

## 2014-03-04 ENCOUNTER — Encounter: Payer: Self-pay | Admitting: Family Medicine

## 2014-03-04 ENCOUNTER — Ambulatory Visit (INDEPENDENT_AMBULATORY_CARE_PROVIDER_SITE_OTHER): Payer: Commercial Managed Care - HMO | Admitting: Family Medicine

## 2014-03-04 VITALS — BP 138/74 | HR 60 | Temp 98.0°F | Wt 215.0 lb

## 2014-03-04 DIAGNOSIS — J069 Acute upper respiratory infection, unspecified: Secondary | ICD-10-CM

## 2014-03-04 DIAGNOSIS — J309 Allergic rhinitis, unspecified: Secondary | ICD-10-CM

## 2014-03-04 DIAGNOSIS — J302 Other seasonal allergic rhinitis: Secondary | ICD-10-CM

## 2014-03-04 MED ORDER — ALBUTEROL SULFATE HFA 108 (90 BASE) MCG/ACT IN AERS: 2.0000 | INHALATION_SPRAY | Freq: Four times a day (QID) | RESPIRATORY_TRACT | Status: AC | PRN

## 2014-03-04 NOTE — Progress Notes (Deleted)
   Subjective:    Patient ID: Samantha Jenkins, female    DOB: [birth year], 73 y.o.   MRN: [id number]  HPI  The patient was in her usual state of health until "all of a sudden" on Monday the patient began to loose her voice, have a non-productive cough, sinus congestion, wheezing with deep breaths, runny nose, sensation of ear fullness, sore throat and itchy watery eyes. The patient also reports lack of sleep in the last few days due to persistent cough and one instance of night sweats. The patient's nasal and eye discharge are clear and watery. The patient denies any fever, nausea, vomiting, dizziness, sinus pain, headache, toothache and muscles aches. The patient has tried taking advil which did not improve her symptoms. The patient has a history of late onset asthma, but her albuterol inhaler is out of date, and she hasn't used it.   The patient also continues to have residual left thumb numbness. The numbness began two months ago at the tip and progressed downwards starting several months ago. The sensation feels like pins and needles without pain. The sensation in her thumb has also changed a little and now feels strange comparative to the other thumb. Overall the patient has been getting better, but it isn't yet fully resolved.   Review of Systems is negative except per HPI.    Objective:   Physical Exam  Constitutional: Patient is oriented to person, place, and time and well-developed, well-nourished, and in no distress. HENT: Head: Normocephalic and atraumatic. Mouth/Throat: Oropharynx is erythematous. Nasal mucosa is boggy and pink.  Eyes: Conjunctivae non-injected and EOM are normal. Pupils are equal, round, and reactive to light.  Ears: Right TM intact and normal in appearance, Left TM more erythematous, non-bulging. Cardiovascular: Normal rate, regular rhythm.  Pulmonary/Chest: Effort normal and breath sounds normal. No respiratory distress. No wheezes or ronchi.    Assessment & Plan:    Acute URI  Allergic rhinitis, seasonal - Plan: albuterol (PROVENTIL HFA;VENTOLIN HFA) 108 (90 BASE) MCG/ACT inhaler  This most likely represents a URI compounded with allergic rhinitis. Recommend rest, fluids and albuterol inhaler for treatment of cough.

## 2014-03-04 NOTE — Progress Notes (Signed)
   Subjective:    Patient ID: Samantha Jenkins, female    DOB: 30-Sep-1941, 73 y.o.   MRN: [id number]  Cough    The patient was in her usual state of health until "all of a sudden" on Monday the patient began to loose her voice, have a non-productive cough, sinus congestion, wheezing with deep breaths, runny nose, sensation of ear fullness, sore throat and itchy watery eyes. The patient also reports lack of sleep in the last few days due to persistent cough and one instance of night sweats. The patient's nasal and eye discharge are clear and watery. The patient denies any fever, nausea, vomiting, dizziness, sinus pain, headache, toothache and muscles aches. The patient has tried taking advil which did not improve her symptoms. The patient has a history of late onset asthma, but her albuterol inhaler is out of date, and she hasn't used it. She relates that she gets allergy symptoms every spring but rarely uses any medications for it.  The patient also continues to have residual left thumb numbness. The numbness began two months ago at the tip and progressed downwards starting several months ago. The sensation feels like pins and needles without pain. The sensation in her thumb has also changed a little and now feels strange comparative to the other thumb. Overall the patient has been getting better, but it isn't yet fully resolved.   Review of Systems  Respiratory: Positive for cough.    is negative except per HPI.    Objective:   Physical Exam  Constitutional: Patient is oriented to person, place, and time and well-developed, well-nourished, and in no distress. HENT: Head: Normocephalic and atraumatic. Mouth/Throat: Oropharynx is erythematous. Nasal mucosa is boggy and pink.  Eyes: Conjunctivae non-injected and EOM are normal. Pupils are equal, round, and reactive to light.  Ears: Right TM intact and normal in appearance, Left TM more erythematous, non-bulging. Cardiovascular: Normal rate, regular  rhythm.  Pulmonary/Chest: Effort normal and breath sounds normal. No respiratory distress. No wheezes or ronchi.    Assessment & Plan:  Acute URI  Allergic rhinitis, seasonal - Plan: albuterol (PROVENTIL HFA;VENTOLIN HFA) 108 (90 BASE) MCG/ACT inhaler  This most likely represents a URI compounded with allergic rhinitis. Recommend rest, fluids and albuterol inhaler for treatment of cough. Also recommended she try either Allegra or Claritin to help with her allergy symptoms.

## 2014-03-08 ENCOUNTER — Ambulatory Visit (INDEPENDENT_AMBULATORY_CARE_PROVIDER_SITE_OTHER): Payer: Commercial Managed Care - HMO | Admitting: Family Medicine

## 2014-03-08 ENCOUNTER — Encounter: Payer: Self-pay | Admitting: Family Medicine

## 2014-03-08 VITALS — BP 130/90 | HR 80

## 2014-03-08 DIAGNOSIS — H109 Unspecified conjunctivitis: Secondary | ICD-10-CM

## 2014-03-08 DIAGNOSIS — J209 Acute bronchitis, unspecified: Secondary | ICD-10-CM

## 2014-03-08 DIAGNOSIS — H6693 Otitis media, unspecified, bilateral: Secondary | ICD-10-CM

## 2014-03-08 DIAGNOSIS — H6690 Otitis media, unspecified, unspecified ear: Secondary | ICD-10-CM

## 2014-03-08 MED ORDER — AMOXICILLIN 875 MG PO TABS: 875.0000 mg | ORAL_TABLET | Freq: Two times a day (BID) | ORAL | Status: DC

## 2014-03-08 NOTE — Patient Instructions (Signed)
Take all the medicine and if not totally back to normal when you finish give me a call and I will call in more

## 2014-03-08 NOTE — Progress Notes (Signed)
   Subjective:    Patient ID: Samantha Jenkins, female    DOB: [birth year], 73 y.o.   MRN: [id number]  HPI She is here for recheck. Her throat is causing her a lot of discomfort as well as ear pain she is also having some drainage from left eye. She also complains of nasal congestion, PND, chest a dry cough.   Review of Systems     Objective:   Physical Exam alert and in no distress. Drainage noted in the left eye. Tympanic membranes were slightly pink, canals are normal. Throat is clear. Tonsils are normal. Neck is supple without adenopathy or thyromegaly. Cardiac exam shows a regular sinus rhythm without murmurs or gallops. Lungs show coarse breath sounds with coughing        Assessment & Plan:  BOM (bilateral otitis media) - Plan: amoxicillin (AMOXIL) 875 MG tablet  Conjunctivitis - Plan: amoxicillin (AMOXIL) 875 MG tablet  Acute bronchitis - Plan: amoxicillin (AMOXIL) 875 MG tablet  He is to call if not entirely better when she finishes the course of the antibiotic.

## 2014-03-24 ENCOUNTER — Telehealth: Payer: Self-pay | Admitting: Internal Medicine

## 2014-03-24 DIAGNOSIS — E785 Hyperlipidemia, unspecified: Secondary | ICD-10-CM

## 2014-03-24 DIAGNOSIS — F341 Dysthymic disorder: Secondary | ICD-10-CM

## 2014-03-24 DIAGNOSIS — I10 Essential (primary) hypertension: Secondary | ICD-10-CM

## 2014-03-24 DIAGNOSIS — F329 Major depressive disorder, single episode, unspecified: Secondary | ICD-10-CM

## 2014-03-24 DIAGNOSIS — F32A Depression, unspecified: Secondary | ICD-10-CM

## 2014-03-24 MED ORDER — SERTRALINE HCL 100 MG PO TABS: 100.0000 mg | ORAL_TABLET | Freq: Every day | ORAL | Status: DC

## 2014-03-24 MED ORDER — BISOPROLOL-HYDROCHLOROTHIAZIDE 5-6.25 MG PO TABS: 1.0000 | ORAL_TABLET | Freq: Every day | ORAL | Status: DC

## 2014-03-24 MED ORDER — PRAVASTATIN SODIUM 40 MG PO TABS: 40.0000 mg | ORAL_TABLET | Freq: Every day | ORAL | Status: DC

## 2014-03-24 NOTE — Telephone Encounter (Signed)
Pt needs a 90 day rx on bisoprolol-hctz, pravastatin, sertraline to [facility]    PT HAS A NEW PHARMACY--[facility]

## 2014-07-12 ENCOUNTER — Ambulatory Visit (INDEPENDENT_AMBULATORY_CARE_PROVIDER_SITE_OTHER): Payer: Commercial Managed Care - HMO | Admitting: Family Medicine

## 2014-07-12 ENCOUNTER — Encounter: Payer: Self-pay | Admitting: Family Medicine

## 2014-07-12 VITALS — BP 140/90 | HR 60 | Wt 218.0 lb

## 2014-07-12 DIAGNOSIS — H60399 Other infective otitis externa, unspecified ear: Secondary | ICD-10-CM

## 2014-07-12 DIAGNOSIS — H60392 Other infective otitis externa, left ear: Secondary | ICD-10-CM

## 2014-07-12 MED ORDER — NEOMYCIN-POLYMYXIN-HC 3.5-10000-1 OT SUSP: 2.0000 [drp] | Freq: Three times a day (TID) | OTIC | Status: DC

## 2014-07-12 NOTE — Progress Notes (Signed)
   Subjective:    Patient ID: Samantha Jenkins, female    DOB: September 22, 1941, 73 y.o.   MRN: [id number]  HPI She complains of a several day history of left ear pain but no drainage, sore throat, fever or chills. She does have chronic congestion from underlying allergies.   Review of Systems     Objective:   Physical Exam Alert and in no distress. TM and canal are normal. Left canal was swollen and the TM could not be seen. Neck is supple without adenopathy. Throat is clear.      Assessment & Plan:  Otitis, externa, infective, left - Plan: neomycin-polymyxin-hydrocortisone (CORTISPORIN) 3.5-10000-1 otic suspension  Tylenol for pain relief. Call if further trouble.

## 2014-11-15 ENCOUNTER — Telehealth: Payer: Self-pay | Admitting: Family Medicine

## 2014-11-15 DIAGNOSIS — F329 Major depressive disorder, single episode, unspecified: Secondary | ICD-10-CM

## 2014-11-15 DIAGNOSIS — I10 Essential (primary) hypertension: Secondary | ICD-10-CM

## 2014-11-15 DIAGNOSIS — F341 Dysthymic disorder: Secondary | ICD-10-CM

## 2014-11-15 DIAGNOSIS — E785 Hyperlipidemia, unspecified: Secondary | ICD-10-CM

## 2014-11-15 DIAGNOSIS — F32A Depression, unspecified: Secondary | ICD-10-CM

## 2014-11-15 MED ORDER — PRAVASTATIN SODIUM 40 MG PO TABS: 40.0000 mg | ORAL_TABLET | Freq: Every day | ORAL | Status: DC

## 2014-11-15 MED ORDER — SERTRALINE HCL 100 MG PO TABS: 100.0000 mg | ORAL_TABLET | Freq: Every day | ORAL | Status: DC

## 2014-11-15 MED ORDER — BISOPROLOL-HYDROCHLOROTHIAZIDE 5-6.25 MG PO TABS: 1.0000 | ORAL_TABLET | Freq: Every day | ORAL | Status: DC

## 2014-11-15 NOTE — Telephone Encounter (Signed)
Pt's meds were to go to [facility] mail order pharmacy and went to local pharmacy. Please cancel meds at local pharmacy and sent to [facility] mail order pharmacy.

## 2014-11-15 NOTE — Telephone Encounter (Signed)
Pt needs RF on all 3 of her medicines,Sertraline, Ziac, Pravastatin  to mail order 90 days each

## 2014-11-15 NOTE — Telephone Encounter (Signed)
Looks like pt hasnt been since in a while for a med check so i am filling her med only for a 90 day supply

## 2014-11-18 HISTORY — PX: CATARACT EXTRACTION: SUR2

## 2015-03-07 ENCOUNTER — Telehealth: Payer: Self-pay | Admitting: Family Medicine

## 2015-03-07 ENCOUNTER — Encounter: Payer: Self-pay | Admitting: Medical

## 2015-03-07 ENCOUNTER — Ambulatory Visit (INDEPENDENT_AMBULATORY_CARE_PROVIDER_SITE_OTHER): Payer: Commercial Managed Care - HMO | Admitting: Medical

## 2015-03-07 ENCOUNTER — Other Ambulatory Visit: Payer: Self-pay

## 2015-03-07 VITALS — BP 126/82 | HR 58 | Temp 97.5°F | Wt 219.0 lb

## 2015-03-07 DIAGNOSIS — K047 Periapical abscess without sinus: Secondary | ICD-10-CM | POA: Diagnosis not present

## 2015-03-07 DIAGNOSIS — E785 Hyperlipidemia, unspecified: Secondary | ICD-10-CM

## 2015-03-07 DIAGNOSIS — F329 Major depressive disorder, single episode, unspecified: Secondary | ICD-10-CM

## 2015-03-07 DIAGNOSIS — I10 Essential (primary) hypertension: Secondary | ICD-10-CM

## 2015-03-07 DIAGNOSIS — F341 Dysthymic disorder: Secondary | ICD-10-CM

## 2015-03-07 DIAGNOSIS — F32A Depression, unspecified: Secondary | ICD-10-CM

## 2015-03-07 MED ORDER — SERTRALINE HCL 100 MG PO TABS: 100.0000 mg | ORAL_TABLET | Freq: Every day | ORAL | Status: DC

## 2015-03-07 MED ORDER — AMOXICILLIN 500 MG PO CAPS: 500.0000 mg | ORAL_CAPSULE | Freq: Three times a day (TID) | ORAL | Status: DC

## 2015-03-07 MED ORDER — PRAVASTATIN SODIUM 40 MG PO TABS: 40.0000 mg | ORAL_TABLET | Freq: Every day | ORAL | Status: DC

## 2015-03-07 MED ORDER — BISOPROLOL-HYDROCHLOROTHIAZIDE 5-6.25 MG PO TABS: 1.0000 | ORAL_TABLET | Freq: Every day | ORAL | Status: DC

## 2015-03-07 NOTE — Telephone Encounter (Signed)
Done

## 2015-03-07 NOTE — Telephone Encounter (Signed)
Pt came in to see shane for an acute visit. While at check in she made a medcheck appt for may. She needs medications filled. They need to be for 90 days because she gets them for free at [facility]. Please send for 90 days pravastatin 40 mg, sertraline 100mg  and bisoprolol/hctz 5/6.25 to [facility] order pharm.

## 2015-03-07 NOTE — Progress Notes (Signed)
Subjective Here for infected tooth.   Has appt to see dentist within a week but can't get in sooner and she assumed they would want the infection improving by then anyhow.   She notes few days ago starting to have pain in left upper molar, red raised gum.  No drainage, no fever, no NVD, no sinus pain.  No other aggravating or relieving factors. No other complaint.   Objective Gen: wdwd, nad Oral: left upper gum above molar with raised area if tendnerss, erythema and entry hole suggesting abscess.  No current drainage.  Otherwise teeth in good repair HENT otherwise negative/normal   Assessment Encounter Diagnosis  Name Primary?  . Dental abscess Yes   Plan: Begin Amoxicillin, use salt water gargles, Listerine rinses, c/t good hygiene, and f/u with dentist. She is currently doing fine with OTC analgesia

## 2015-03-30 ENCOUNTER — Ambulatory Visit (INDEPENDENT_AMBULATORY_CARE_PROVIDER_SITE_OTHER): Payer: Commercial Managed Care - HMO | Admitting: Family Medicine

## 2015-03-30 ENCOUNTER — Encounter: Payer: Self-pay | Admitting: Family Medicine

## 2015-03-30 VITALS — BP 120/80 | HR 50 | Wt 216.2 lb

## 2015-03-30 DIAGNOSIS — Z1239 Encounter for other screening for malignant neoplasm of breast: Secondary | ICD-10-CM

## 2015-03-30 DIAGNOSIS — E785 Hyperlipidemia, unspecified: Secondary | ICD-10-CM | POA: Diagnosis not present

## 2015-03-30 DIAGNOSIS — E669 Obesity, unspecified: Secondary | ICD-10-CM | POA: Diagnosis not present

## 2015-03-30 DIAGNOSIS — E8881 Metabolic syndrome: Secondary | ICD-10-CM

## 2015-03-30 DIAGNOSIS — F329 Major depressive disorder, single episode, unspecified: Secondary | ICD-10-CM | POA: Diagnosis not present

## 2015-03-30 DIAGNOSIS — I10 Essential (primary) hypertension: Secondary | ICD-10-CM | POA: Diagnosis not present

## 2015-03-30 DIAGNOSIS — I447 Left bundle-branch block, unspecified: Secondary | ICD-10-CM | POA: Diagnosis not present

## 2015-03-30 DIAGNOSIS — F341 Dysthymic disorder: Secondary | ICD-10-CM | POA: Diagnosis not present

## 2015-03-30 DIAGNOSIS — H269 Unspecified cataract: Secondary | ICD-10-CM | POA: Diagnosis not present

## 2015-03-30 DIAGNOSIS — Z8249 Family history of ischemic heart disease and other diseases of the circulatory system: Secondary | ICD-10-CM | POA: Diagnosis not present

## 2015-03-30 DIAGNOSIS — F32A Depression, unspecified: Secondary | ICD-10-CM

## 2015-03-30 LAB — CBC WITH DIFFERENTIAL/PLATELET
Basophils Absolute: 0 10*3/uL (ref 0.0–0.1)
Basophils Relative: 0 % (ref 0–1)
Eosinophils Absolute: 0.2 10*3/uL (ref 0.0–0.7)
Eosinophils Relative: 3 % (ref 0–5)
HCT: 42.5 % (ref 36.0–46.0)
Hemoglobin: 14.5 g/dL (ref 12.0–15.0)
Lymphocytes Relative: 28 % (ref 12–46)
Lymphs Abs: 1.6 10*3/uL (ref 0.7–4.0)
MCH: 31.7 pg (ref 26.0–34.0)
MCHC: 34.1 g/dL (ref 30.0–36.0)
MCV: 93 fL (ref 78.0–100.0)
MPV: 10.6 fL (ref 8.6–12.4)
Monocytes Absolute: 0.5 10*3/uL (ref 0.1–1.0)
Monocytes Relative: 9 % (ref 3–12)
Neutro Abs: 3.4 10*3/uL (ref 1.7–7.7)
Neutrophils Relative %: 60 % (ref 43–77)
Platelets: 273 10*3/uL (ref 150–400)
RBC: 4.57 MIL/uL (ref 3.87–5.11)
RDW: 14.4 % (ref 11.5–15.5)
WBC: 5.6 10*3/uL (ref 4.0–10.5)

## 2015-03-30 LAB — COMPREHENSIVE METABOLIC PANEL
ALT: 18 U/L (ref 0–35)
AST: 21 U/L (ref 0–37)
Albumin: 4 g/dL (ref 3.5–5.2)
Alkaline Phosphatase: 90 U/L (ref 39–117)
BUN: 15 mg/dL (ref 6–23)
CO2: 24 mEq/L (ref 19–32)
Calcium: 9.4 mg/dL (ref 8.4–10.5)
Chloride: 107 mEq/L (ref 96–112)
Creat: 0.71 mg/dL (ref 0.50–1.10)
Glucose, Bld: 106 mg/dL — ABNORMAL HIGH (ref 70–99)
Potassium: 4 mEq/L (ref 3.5–5.3)
Sodium: 143 mEq/L (ref 135–145)
Total Bilirubin: 0.7 mg/dL (ref 0.2–1.2)
Total Protein: 6.7 g/dL (ref 6.0–8.3)

## 2015-03-30 LAB — LIPID PANEL
Cholesterol: 156 mg/dL (ref 0–200)
HDL: 54 mg/dL (ref >=46)
LDL Cholesterol: 84 mg/dL (ref 0–99)
Total CHOL/HDL Ratio: 2.9 Ratio
Triglycerides: 92 mg/dL (ref <150)
VLDL: 18 mg/dL (ref 0–40)

## 2015-03-30 MED ORDER — BISOPROLOL-HYDROCHLOROTHIAZIDE 5-6.25 MG PO TABS: 1.0000 | ORAL_TABLET | Freq: Every day | ORAL | Status: DC

## 2015-03-30 MED ORDER — SERTRALINE HCL 100 MG PO TABS: 100.0000 mg | ORAL_TABLET | Freq: Every day | ORAL | Status: DC

## 2015-03-30 MED ORDER — PRAVASTATIN SODIUM 40 MG PO TABS: 40.0000 mg | ORAL_TABLET | Freq: Every day | ORAL | Status: DC

## 2015-03-30 NOTE — Progress Notes (Signed)
   Subjective:    Patient ID: Samantha Jenkins, female    DOB: 03/23/1941, 74 y.o.   MRN: [id number]  HPI She is here for medication check. Her weight is essentially unchanged. She continues on her blood pressure medication as well as Pravachol and is having no difficulty with them. She has had no chest pain, shortness breath, PND or pulmonary issues. She uses her albuterol inhaler maybe twice per year. She does have cataracts and is planning on having surgery. She continues to do quite nicely on her Zoloft and has no interest in stopping this. There is a family history of heart disease. Her immunizations and health maintenance was reviewed. She is not interested in having a colonoscopy. She will need to be set up for mammogram. She does not have a living will.She does have a previous history of metabolic syndrome.  Review of Systems     Objective:   Physical Exam Alert and in no distress. Tympanic membranes and canals are normal. Pharyngeal area is normal. Neck is supple without adenopathy or thyromegaly. Cardiac exam shows a regular sinus rhythm without murmurs or gallops. Lungs are clear to auscultation. Abdominal exam shows no masses or tenderness with normal bowel sounds.       Assessment & Plan:  Obesity (BMI 30-39.9) - Plan: CBC with Differential/Platelet, Comprehensive metabolic panel, Lipid panel  Essential hypertension - Plan: CBC with Differential/Platelet, Comprehensive metabolic panel, bisoprolol-hydrochlorothiazide (ZIAC) 5-6.25 MG per tablet  Hyperlipidemia LDL goal <100 - Plan: Lipid panel, pravastatin (PRAVACHOL) 40 MG tablet  Family history of heart disease in female family member before age 13 - Plan: CBC with Differential/Platelet, Comprehensive metabolic panel, Lipid panel  Dysthymia - Plan: sertraline (ZOLOFT) 100 MG tablet  Metabolic syndrome  LBBB (left bundle branch block)  Cataract  Depression - Plan: sertraline (ZOLOFT) 100 MG tablet  Screening for breast  cancer - Plan: MM DIGITAL SCREENING BILATERAL Living will was discussed with the patient. Discussed that this is a way to let other people know what her care preferences are. We discussed completing the forms. The form was given. Discussed the fact this will allow her to have short-term as well as long-term options.Also gave her information concerning use of MOST and the fact that she needs to bring this back for signature. Encouraged her to continue on all her present medications. Routine blood screening was ordered.

## 2015-04-19 ENCOUNTER — Ambulatory Visit
Admission: RE | Admit: 2015-04-19 | Discharge: 2015-04-19 | Disposition: A | Discharge status: Home / Self Care | Payer: Commercial Managed Care - HMO | Source: Ambulatory Visit | Attending: Family Medicine | Admitting: Family Medicine

## 2015-05-12 ENCOUNTER — Encounter: Payer: Self-pay | Admitting: Family Medicine

## 2015-06-29 ENCOUNTER — Ambulatory Visit (INDEPENDENT_AMBULATORY_CARE_PROVIDER_SITE_OTHER): Payer: Commercial Managed Care - HMO | Admitting: Family Medicine

## 2015-06-29 ENCOUNTER — Encounter: Payer: Self-pay | Admitting: Family Medicine

## 2015-06-29 VITALS — BP 122/72 | HR 68 | Ht 65.5 in | Wt 221.0 lb

## 2015-06-29 DIAGNOSIS — M25561 Pain in right knee: Secondary | ICD-10-CM

## 2015-06-29 MED ORDER — MELOXICAM 15 MG PO TABS: 7.5000 mg | ORAL_TABLET | Freq: Every day | ORAL | Status: DC

## 2015-06-29 NOTE — Progress Notes (Signed)
Chief Complaint  Patient presents with  . Knee Pain    right knee pain x a week, extreme pain x 4-5 days.    She has had some intermittent twinges of pain in her right knee starting a couple of weeks ago, thought maybe she twisted it a bit.  In the last 4-5 days her pain has been more severe.  She has been "living on Aleve" and using a knee compression sleeve.  Last dose of Aleve was yesterday morning.  Aleve seems to take the edge off, taking 2 pills twice daily.  Denies any GI effects.  She denies any change in activity, squatting, injury, change in shoes or other reason for onset of pain.  She noticed just a little swelling at first, which went away after the low-light therapy.  6 days ago her knee felt like it was "dissolving" like it might give way, but it didn't.  Denies any locking of the knee.  She had 2 sessions of low light therapy for her knee pain, which has helped.  PMH, PSH, SH reviewed  Outpatient Encounter Prescriptions as of 06/29/2015  Medication Sig Note  . albuterol (PROVENTIL HFA;VENTOLIN HFA) 108 (90 BASE) MCG/ACT inhaler Inhale 2 puffs into the lungs every 6 (six) hours as needed for wheezing or shortness of breath.   . bisoprolol-hydrochlorothiazide (ZIAC) 5-6.25 MG per tablet Take 1 tablet by mouth daily.   . Cholecalciferol (VITAMIN D) 2000 UNITS CAPS Take by mouth.     . pravastatin (PRAVACHOL) 40 MG tablet Take 1 tablet (40 mg total) by mouth daily.   . sertraline (ZOLOFT) 100 MG tablet Take 1 tablet (100 mg total) by mouth daily.   Marland Kitchen aspirin 81 MG tablet Take 81 mg by mouth daily.     Marland Kitchen ibuprofen (ADVIL,MOTRIN) 200 MG tablet Take 200 mg by mouth every 6 (six) hours as needed.     . Ibuprofen-Diphenhydramine Cit (ADVIL PM PO) Take by mouth. As needed for sleep    . [DISCONTINUED] DUREZOL 0.05 % EMUL INT 1 GTT IN OS BID. START AFTER SURGERY 06/29/2015: Received from: External Pharmacy  . [DISCONTINUED] PROLENSA 0.07 % SOLN INT 1 GTT IN OS D. START 1 DAY PRIOR TO  SURGERY AND CONTINUE UNTIL GONE 06/29/2015: Received from: External Pharmacy   No facility-administered encounter medications on file as of 06/29/2015.   Hasn't been taking ibuprofen  Allergies  Allergen Reactions  . Statins     Muscle aches  . Sulfa Antibiotics Other (See Comments)    Blood in urine   ROS:  No fever, chills, URI symptoms, chest pain, shortness of breath, nausea, vomiting, diarrhea, bleeding, bruising, rash.  No lower extremity edema (knee swelling resolved). No numbness, tingling, weakness.  See HPI  PHYSICAL EXAM: BP 122/72 mmHg  Pulse 68  Ht 5' 5.5" (1.664 m)  Wt 221 lb (100.245 kg)  BMI 36.20 kg/m2  Well developed, pleasant female in no distress.   Right knee: Mild warmth to palpation over right knee.  Minimal soft tissue swelling noted laterally Slight stiffness with ROM.  No crepitus. Very slight laxity with drawer test. Negative McMurray; no pain with flick test, or valgus, varus stressors. 2+ pulses  ASSESSMENT/PLAN:  Right knee pain - Plan: meloxicam (MOBIC) 15 MG tablet  Right knee strain Rest, ice, NSAID. F/u with Dr. Redmond School next week if not improving.  NSAID precautions were reviewed with the patient.  Patient should take medication with food, discontinue if develops GI side effects, not to take  other OTC NSAIDs at the same time, and not to use longer than recommended.

## 2015-06-29 NOTE — Patient Instructions (Signed)
Take your prescribed anti-inflammatory medication with food; discontinue or cut back the dose if you develop stomach pain/discomfort/side effects.  Do not take other over-the-counter pain medications such as ibuprofen, advil, motrin, aleve, naproxen at the same time.  Do not use longer than recommended.  It is okay to use acetaminophen (tylenol) along with this medication.  Please try and work on losing weight.  Continue knee brace, icing (vs try alternating with heat, continue whichever works better).  Rest, and leg elevation.  Supportive shoes, as discussed.  Return next week to see Dr. Redmond School if not improving. You may need x-rays or a cortisone shot.

## 2015-07-06 ENCOUNTER — Encounter: Payer: Self-pay | Admitting: Family Medicine

## 2015-07-06 ENCOUNTER — Ambulatory Visit (INDEPENDENT_AMBULATORY_CARE_PROVIDER_SITE_OTHER): Payer: Medicare HMO | Admitting: Family Medicine

## 2015-07-06 VITALS — BP 128/72 | HR 55 | Wt 217.0 lb

## 2015-07-06 DIAGNOSIS — M25461 Effusion, right knee: Secondary | ICD-10-CM

## 2015-07-06 DIAGNOSIS — M25561 Pain in right knee: Secondary | ICD-10-CM

## 2015-07-06 NOTE — Progress Notes (Signed)
   Subjective:    Patient ID: Samantha Jenkins, female    DOB: 1940/11/23, 74 y.o.   MRN: [id number]  HPI She is here for recheck. She was seen recently. She has a three-week history of difficulty with her right knee. Since last being seen she now notes increased swelling as well as a clicking sensation in the knee and feeling weakness. No locking popping or grinding.   Review of Systems     Objective:   Physical Exam Right knee exam does show an effusion. No tenderness on palpation of the patella or patellar tendon. No joint line tenderness. McMurray's testing negative. Anterior drawer negative.       Assessment & Plan:  Right knee pain - Plan: DG Knee Complete 4 Views Right  Knee effusion, right - Plan: DG Knee Complete 4 Views Right The x-rays are negative, I will consider giving her a steroid injection to help with the clicking and effusion. This could possibly be a meniscal issue.

## 2015-07-07 ENCOUNTER — Ambulatory Visit
Admission: RE | Admit: 2015-07-07 | Discharge: 2015-07-07 | Disposition: A | Discharge status: Home / Self Care | Payer: Commercial Managed Care - HMO | Source: Ambulatory Visit | Attending: Family Medicine | Admitting: Family Medicine

## 2015-07-07 DIAGNOSIS — M25461 Effusion, right knee: Secondary | ICD-10-CM

## 2015-07-07 DIAGNOSIS — M25561 Pain in right knee: Secondary | ICD-10-CM

## 2015-07-10 NOTE — Progress Notes (Signed)
Pt called, I advised of results.  She will come in Friday for injection.

## 2015-07-11 ENCOUNTER — Ambulatory Visit (INDEPENDENT_AMBULATORY_CARE_PROVIDER_SITE_OTHER): Payer: Medicare HMO | Admitting: Family Medicine

## 2015-07-11 DIAGNOSIS — M25561 Pain in right knee: Secondary | ICD-10-CM | POA: Diagnosis not present

## 2015-07-11 DIAGNOSIS — M1990 Unspecified osteoarthritis, unspecified site: Secondary | ICD-10-CM

## 2015-07-11 MED ORDER — MELOXICAM 15 MG PO TABS: 7.5000 mg | ORAL_TABLET | Freq: Every day | ORAL | Status: DC

## 2015-07-11 NOTE — Progress Notes (Signed)
   Subjective:    Patient ID: Samantha Jenkins, female    DOB: Jan 27, 1941, 74 y.o.   MRN: [id number]  HPI She is here for an injection for her right knee. X-rays did show some degenerative changes. She has not responded well to Mobic  Review of Systems     Objective:   Physical Exam Alert and in no distress. Good motion of the knee with pain. No effusion noted.       Assessment & Plan:  Right knee pain - Plan: meloxicam (MOBIC) 15 MG tablet  Arthritis The right lateral joint line was identified. The area was prepped with Betadine. 40 mg of Kenalog and 3 mL of Xylocaine was injected into the joint with good results. She did have resolution of her pain. Discussed follow-up. Explained that it would depend upon how long  She remains asymptomatic. She voiced understanding.

## 2015-07-14 ENCOUNTER — Ambulatory Visit: Payer: Medicare HMO | Admitting: Family Medicine

## 2015-07-27 ENCOUNTER — Telehealth: Payer: Self-pay | Admitting: Family Medicine

## 2015-07-27 ENCOUNTER — Other Ambulatory Visit: Payer: Self-pay

## 2015-07-27 DIAGNOSIS — M25561 Pain in right knee: Secondary | ICD-10-CM

## 2015-07-27 MED ORDER — MELOXICAM 15 MG PO TABS: 7.5000 mg | ORAL_TABLET | Freq: Every day | ORAL | Status: DC

## 2015-07-27 NOTE — Telephone Encounter (Signed)
Pt called and stated that her knee is stilling bothering her. She states that injection didn't work. She states that she only has one Mobic pill left. She will need refills of that. Pt uses [facility] at [facility] [address] [facility] and can be reached on her CELL Phone today. [phone].

## 2015-07-27 NOTE — Telephone Encounter (Signed)
Call in the Mobic and set her up to see an orthopedic surgeon. Find out who she wants to see

## 2015-07-27 NOTE — Telephone Encounter (Signed)
Patient informed of med sent in and also she prefers Dr.Olin so I will fax all info over to him

## 2015-08-11 ENCOUNTER — Telehealth: Payer: Self-pay | Admitting: Family Medicine

## 2015-08-11 NOTE — Telephone Encounter (Signed)
Melissa spoke to patient about this

## 2015-08-11 NOTE — Telephone Encounter (Signed)
Swelling could be from Mobic/NSAIDs or from being on feet long periods.   If its one sided, if there is significant calve pain or swelling, or shortness of breath, then may need to come in.  If feeling fine other than a little swelling, then I recommend leg elevation and walking for exercise in general.

## 2015-08-11 NOTE — Telephone Encounter (Signed)
Pt says the cortisone shot that Dr Redmond School gave her has started to work now she thinks or the Meloxicam is helping however she says that her feet has started swelling 4 days to a weeks ago and she wants to know if this is normal. Is it a side effect of the shot or Meloxicam? Sometimes the swelling is so bad that it is difficult to get shoes on.

## 2015-08-25 ENCOUNTER — Telehealth: Payer: Self-pay | Admitting: Medical

## 2015-08-25 ENCOUNTER — Other Ambulatory Visit: Payer: Self-pay | Admitting: Medical

## 2015-08-25 DIAGNOSIS — M25561 Pain in right knee: Secondary | ICD-10-CM

## 2015-08-25 MED ORDER — MELOXICAM 15 MG PO TABS: 7.5000 mg | ORAL_TABLET | Freq: Every day | ORAL | Status: DC

## 2015-08-25 NOTE — Telephone Encounter (Signed)
Pt states ran out of Meloxicam this morning and doesn't have appt until 10/19 with Ortho Paralee Cancel And would like a refill but only wants #15 to [facility] incase the Ortho changes her to something else.

## 2015-09-08 ENCOUNTER — Telehealth: Payer: Self-pay | Admitting: Family Medicine

## 2015-09-08 ENCOUNTER — Other Ambulatory Visit: Payer: Self-pay

## 2015-09-08 DIAGNOSIS — M25561 Pain in right knee: Secondary | ICD-10-CM

## 2015-09-08 MED ORDER — MELOXICAM 15 MG PO TABS: 7.5000 mg | ORAL_TABLET | Freq: Every day | ORAL | Status: DC

## 2015-09-08 NOTE — Telephone Encounter (Signed)
Pt informed and verbalized understanding

## 2015-09-08 NOTE — Telephone Encounter (Signed)
Let her know that the meloxicam sometimes can cause some edema. Have her stay as physically active as possible and I will renew the meloxicam

## 2015-09-08 NOTE — Telephone Encounter (Signed)
Pt wants to know if she should continue taking Meloxicam 15mg  and if so she needs refills. She says Dr Alvan Dame told her to check to see if Dr Redmond School wants that continued. Also pt says she noticed that her feet and ankles have been swollen for about 2 weeks. Does she need med for this or what does she need to do?

## 2015-09-13 ENCOUNTER — Other Ambulatory Visit (INDEPENDENT_AMBULATORY_CARE_PROVIDER_SITE_OTHER): Payer: Commercial Managed Care - HMO

## 2015-09-13 DIAGNOSIS — Z23 Encounter for immunization: Secondary | ICD-10-CM | POA: Diagnosis not present

## 2015-09-27 ENCOUNTER — Telehealth: Payer: Self-pay | Admitting: Family Medicine

## 2015-09-27 DIAGNOSIS — M25561 Pain in right knee: Secondary | ICD-10-CM

## 2015-09-27 MED ORDER — MELOXICAM 15 MG PO TABS: 7.5000 mg | ORAL_TABLET | Freq: Every day | ORAL | Status: DC

## 2015-09-27 NOTE — Telephone Encounter (Signed)
Pt called for refills of mobic. She would like 30 not 15. [facility] uses [facility] at [facility] and can be reached at [phone].

## 2015-09-28 ENCOUNTER — Telehealth: Payer: Self-pay | Admitting: Internal Medicine

## 2015-09-28 NOTE — Telephone Encounter (Signed)
Spoke with patient and she states that she does not want a colonoscopy done at all as she has heard bad things about it and will not get one

## 2015-09-28 NOTE — Telephone Encounter (Signed)
Left message for pt to find out if she has had a colonoscopy recently or when the last time she had one done.

## 2015-11-30 ENCOUNTER — Other Ambulatory Visit: Payer: Self-pay | Admitting: Family Medicine

## 2015-11-30 NOTE — Telephone Encounter (Signed)
Is this okay to refill? 

## 2016-02-29 ENCOUNTER — Other Ambulatory Visit: Payer: Self-pay | Admitting: Family Medicine

## 2016-02-29 NOTE — Telephone Encounter (Signed)
Is this okay to refill? 

## 2016-04-04 ENCOUNTER — Other Ambulatory Visit: Payer: Self-pay | Admitting: Family Medicine

## 2016-04-04 NOTE — Telephone Encounter (Signed)
It's time for medication check visit for her. Give her enough to cover until the appointment

## 2016-04-04 NOTE — Telephone Encounter (Signed)
Dr.lalonde are these okay to refill

## 2016-04-19 ENCOUNTER — Telehealth: Payer: Self-pay

## 2016-04-19 NOTE — Telephone Encounter (Signed)
Records faxed to [facility] at [facility] per pt signed request. Samantha Jenkins

## 2016-06-03 ENCOUNTER — Encounter: Payer: Self-pay | Admitting: Podiatry

## 2016-06-03 ENCOUNTER — Ambulatory Visit (INDEPENDENT_AMBULATORY_CARE_PROVIDER_SITE_OTHER): Payer: Commercial Managed Care - HMO | Admitting: Podiatry

## 2016-06-03 VITALS — BP 158/79 | HR 53 | Resp 16 | Ht 65.0 in | Wt 210.0 lb

## 2016-06-03 DIAGNOSIS — M79604 Pain in right leg: Secondary | ICD-10-CM

## 2016-06-03 DIAGNOSIS — M79605 Pain in left leg: Secondary | ICD-10-CM

## 2016-06-03 DIAGNOSIS — L600 Ingrowing nail: Secondary | ICD-10-CM | POA: Diagnosis not present

## 2016-06-03 DIAGNOSIS — M79675 Pain in left toe(s): Secondary | ICD-10-CM | POA: Diagnosis not present

## 2016-06-03 DIAGNOSIS — B351 Tinea unguium: Secondary | ICD-10-CM | POA: Diagnosis not present

## 2016-06-03 DIAGNOSIS — M79674 Pain in right toe(s): Secondary | ICD-10-CM

## 2016-06-03 NOTE — Progress Notes (Signed)
   Subjective:    Patient ID: Samantha Jenkins, female    DOB: 08/12/1941, 75 y.o.   MRN: [id number]  HPI Chief Complaint  Patient presents with  . Debridement    Bilateral nail trim  . Nail Problem    Bilateral; nail discoloration & thickened nails; pt needs nails checked for nail fungus      Review of Systems  Cardiovascular: Positive for leg swelling.  Hematological: Bruises/bleeds easily.  All other systems reviewed and are negative.      Objective:   Physical Exam        Assessment & Plan:

## 2016-06-04 NOTE — Progress Notes (Signed)
Subjective:     Patient ID: Samantha Jenkins, female   DOB: 02-10-1941, 75 y.o.   MRN: [id number]  HPI patient presents with nail disease 1-5 of both feet that get thick and also history of ingrown toenails. Patient states that they can bother her when she tries to be active and she cannot cut them herself   Review of Systems  All other systems reviewed and are negative.      Objective:   Physical Exam  Constitutional: She is oriented to person, place, and time.  Cardiovascular: Intact distal pulses.   Musculoskeletal: Normal range of motion.  Neurological: She is oriented to person, place, and time.  Skin: Skin is warm and dry.  Nursing note and vitals reviewed.  neurovascular status intact muscle strength adequate range of motion was found to be diminished in the subtalar midtarsal joint with no diminishment in the forefoot. Patient's found to have thick yellow brittle nailbeds 1-5 both feet that are painful when pressed and incurvated in the corners and is noted to have moderate irritation of the right hallux nail with patient found to have good digital perfusion and well oriented 3     Assessment:     Mycotic nail infection 1-5 both feet with thickness and moderate ingrown toenail deformity    Plan:     H&P condition reviewed and today I debrided nailbeds 1-5 both feet with no iatrogenic bleeding and reappoint to recheck

## 2016-09-05 ENCOUNTER — Ambulatory Visit (INDEPENDENT_AMBULATORY_CARE_PROVIDER_SITE_OTHER): Payer: Commercial Managed Care - HMO | Admitting: Podiatry

## 2016-09-05 DIAGNOSIS — B351 Tinea unguium: Secondary | ICD-10-CM | POA: Diagnosis not present

## 2016-09-05 DIAGNOSIS — M79674 Pain in right toe(s): Secondary | ICD-10-CM | POA: Diagnosis not present

## 2016-09-05 DIAGNOSIS — M79604 Pain in right leg: Secondary | ICD-10-CM

## 2016-09-05 DIAGNOSIS — M79675 Pain in left toe(s): Secondary | ICD-10-CM

## 2016-09-05 DIAGNOSIS — M79605 Pain in left leg: Secondary | ICD-10-CM

## 2016-09-05 NOTE — Progress Notes (Signed)
Subjective:     Patient ID: Samantha Jenkins, female   DOB: [birth year], 75 y.o.   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

## 2016-11-20 DIAGNOSIS — E7800 Pure hypercholesterolemia, unspecified: Secondary | ICD-10-CM | POA: Diagnosis not present

## 2016-11-20 DIAGNOSIS — M1990 Unspecified osteoarthritis, unspecified site: Secondary | ICD-10-CM | POA: Diagnosis not present

## 2016-11-20 DIAGNOSIS — Z0000 Encounter for general adult medical examination without abnormal findings: Secondary | ICD-10-CM | POA: Diagnosis not present

## 2016-11-20 DIAGNOSIS — F324 Major depressive disorder, single episode, in partial remission: Secondary | ICD-10-CM | POA: Diagnosis not present

## 2016-11-20 DIAGNOSIS — I10 Essential (primary) hypertension: Secondary | ICD-10-CM | POA: Diagnosis not present

## 2016-11-20 DIAGNOSIS — L03019 Cellulitis of unspecified finger: Secondary | ICD-10-CM | POA: Diagnosis not present

## 2016-12-05 ENCOUNTER — Ambulatory Visit: Payer: Commercial Managed Care - HMO

## 2017-02-06 DIAGNOSIS — M8588 Other specified disorders of bone density and structure, other site: Secondary | ICD-10-CM | POA: Diagnosis not present

## 2017-02-06 DIAGNOSIS — Z13820 Encounter for screening for osteoporosis: Secondary | ICD-10-CM | POA: Diagnosis not present

## 2017-04-24 DIAGNOSIS — M25561 Pain in right knee: Secondary | ICD-10-CM | POA: Diagnosis not present

## 2017-04-24 DIAGNOSIS — M1711 Unilateral primary osteoarthritis, right knee: Secondary | ICD-10-CM | POA: Diagnosis not present

## 2017-04-24 DIAGNOSIS — M238X1 Other internal derangements of right knee: Secondary | ICD-10-CM | POA: Diagnosis not present

## 2017-06-05 DIAGNOSIS — E7800 Pure hypercholesterolemia, unspecified: Secondary | ICD-10-CM | POA: Diagnosis not present

## 2017-06-05 DIAGNOSIS — N393 Stress incontinence (female) (male): Secondary | ICD-10-CM | POA: Diagnosis not present

## 2017-06-05 DIAGNOSIS — K120 Recurrent oral aphthae: Secondary | ICD-10-CM | POA: Diagnosis not present

## 2017-06-05 DIAGNOSIS — I10 Essential (primary) hypertension: Secondary | ICD-10-CM | POA: Diagnosis not present

## 2017-06-05 DIAGNOSIS — F324 Major depressive disorder, single episode, in partial remission: Secondary | ICD-10-CM | POA: Diagnosis not present

## 2017-06-11 DIAGNOSIS — H353131 Nonexudative age-related macular degeneration, bilateral, early dry stage: Secondary | ICD-10-CM | POA: Diagnosis not present

## 2017-06-11 DIAGNOSIS — H524 Presbyopia: Secondary | ICD-10-CM | POA: Diagnosis not present

## 2017-06-11 DIAGNOSIS — H43813 Vitreous degeneration, bilateral: Secondary | ICD-10-CM | POA: Diagnosis not present

## 2017-06-11 DIAGNOSIS — H26493 Other secondary cataract, bilateral: Secondary | ICD-10-CM | POA: Diagnosis not present

## 2017-06-11 DIAGNOSIS — Z961 Presence of intraocular lens: Secondary | ICD-10-CM | POA: Diagnosis not present

## 2017-09-25 DIAGNOSIS — Z23 Encounter for immunization: Secondary | ICD-10-CM | POA: Diagnosis not present

## 2017-09-25 DIAGNOSIS — H6090 Unspecified otitis externa, unspecified ear: Secondary | ICD-10-CM | POA: Diagnosis not present

## 2017-11-27 DIAGNOSIS — E7800 Pure hypercholesterolemia, unspecified: Secondary | ICD-10-CM | POA: Diagnosis not present

## 2017-11-27 DIAGNOSIS — M25511 Pain in right shoulder: Secondary | ICD-10-CM | POA: Diagnosis not present

## 2017-11-27 DIAGNOSIS — I10 Essential (primary) hypertension: Secondary | ICD-10-CM | POA: Diagnosis not present

## 2017-11-27 DIAGNOSIS — M1990 Unspecified osteoarthritis, unspecified site: Secondary | ICD-10-CM | POA: Diagnosis not present

## 2017-11-27 DIAGNOSIS — F324 Major depressive disorder, single episode, in partial remission: Secondary | ICD-10-CM | POA: Diagnosis not present

## 2017-11-27 DIAGNOSIS — Z0000 Encounter for general adult medical examination without abnormal findings: Secondary | ICD-10-CM | POA: Diagnosis not present

## 2017-12-03 DIAGNOSIS — M179 Osteoarthritis of knee, unspecified: Secondary | ICD-10-CM | POA: Insufficient documentation

## 2017-12-03 DIAGNOSIS — M25561 Pain in right knee: Secondary | ICD-10-CM | POA: Insufficient documentation

## 2017-12-03 DIAGNOSIS — M1710 Unilateral primary osteoarthritis, unspecified knee: Secondary | ICD-10-CM | POA: Insufficient documentation

## 2017-12-04 DIAGNOSIS — M170 Bilateral primary osteoarthritis of knee: Secondary | ICD-10-CM | POA: Diagnosis not present

## 2017-12-04 DIAGNOSIS — M1712 Unilateral primary osteoarthritis, left knee: Secondary | ICD-10-CM | POA: Insufficient documentation

## 2017-12-11 DIAGNOSIS — Z803 Family history of malignant neoplasm of breast: Secondary | ICD-10-CM | POA: Diagnosis not present

## 2017-12-11 DIAGNOSIS — Z1231 Encounter for screening mammogram for malignant neoplasm of breast: Secondary | ICD-10-CM | POA: Diagnosis not present

## 2017-12-17 DIAGNOSIS — Z803 Family history of malignant neoplasm of breast: Secondary | ICD-10-CM | POA: Diagnosis not present

## 2017-12-17 DIAGNOSIS — N6012 Diffuse cystic mastopathy of left breast: Secondary | ICD-10-CM | POA: Diagnosis not present

## 2017-12-17 DIAGNOSIS — R928 Other abnormal and inconclusive findings on diagnostic imaging of breast: Secondary | ICD-10-CM | POA: Diagnosis not present

## 2017-12-17 DIAGNOSIS — Z1231 Encounter for screening mammogram for malignant neoplasm of breast: Secondary | ICD-10-CM | POA: Diagnosis not present

## 2017-12-24 DIAGNOSIS — M13811 Other specified arthritis, right shoulder: Secondary | ICD-10-CM | POA: Diagnosis not present

## 2017-12-24 DIAGNOSIS — M25511 Pain in right shoulder: Secondary | ICD-10-CM | POA: Diagnosis not present

## 2017-12-31 DIAGNOSIS — L821 Other seborrheic keratosis: Secondary | ICD-10-CM | POA: Diagnosis not present

## 2017-12-31 DIAGNOSIS — L218 Other seborrheic dermatitis: Secondary | ICD-10-CM | POA: Diagnosis not present

## 2017-12-31 DIAGNOSIS — D225 Melanocytic nevi of trunk: Secondary | ICD-10-CM | POA: Diagnosis not present

## 2017-12-31 DIAGNOSIS — D1801 Hemangioma of skin and subcutaneous tissue: Secondary | ICD-10-CM | POA: Diagnosis not present

## 2018-02-26 DIAGNOSIS — L729 Follicular cyst of the skin and subcutaneous tissue, unspecified: Secondary | ICD-10-CM | POA: Diagnosis not present

## 2018-03-18 DIAGNOSIS — Z0000 Encounter for general adult medical examination without abnormal findings: Secondary | ICD-10-CM | POA: Diagnosis not present

## 2018-03-18 DIAGNOSIS — Z1211 Encounter for screening for malignant neoplasm of colon: Secondary | ICD-10-CM | POA: Diagnosis not present

## 2018-05-13 DIAGNOSIS — N3000 Acute cystitis without hematuria: Secondary | ICD-10-CM | POA: Diagnosis not present

## 2018-05-13 DIAGNOSIS — R319 Hematuria, unspecified: Secondary | ICD-10-CM | POA: Diagnosis not present

## 2018-05-27 DIAGNOSIS — I10 Essential (primary) hypertension: Secondary | ICD-10-CM | POA: Diagnosis not present

## 2018-05-27 DIAGNOSIS — E7800 Pure hypercholesterolemia, unspecified: Secondary | ICD-10-CM | POA: Diagnosis not present

## 2018-05-27 DIAGNOSIS — J45909 Unspecified asthma, uncomplicated: Secondary | ICD-10-CM | POA: Diagnosis not present

## 2018-06-08 DIAGNOSIS — K529 Noninfective gastroenteritis and colitis, unspecified: Secondary | ICD-10-CM | POA: Diagnosis not present

## 2018-06-17 DIAGNOSIS — H26493 Other secondary cataract, bilateral: Secondary | ICD-10-CM | POA: Diagnosis not present

## 2018-06-17 DIAGNOSIS — Z961 Presence of intraocular lens: Secondary | ICD-10-CM | POA: Diagnosis not present

## 2018-06-17 DIAGNOSIS — H524 Presbyopia: Secondary | ICD-10-CM | POA: Diagnosis not present

## 2018-06-17 DIAGNOSIS — H353131 Nonexudative age-related macular degeneration, bilateral, early dry stage: Secondary | ICD-10-CM | POA: Diagnosis not present

## 2018-06-17 DIAGNOSIS — H52223 Regular astigmatism, bilateral: Secondary | ICD-10-CM | POA: Diagnosis not present

## 2018-06-17 DIAGNOSIS — H43813 Vitreous degeneration, bilateral: Secondary | ICD-10-CM | POA: Diagnosis not present

## 2018-06-24 DIAGNOSIS — R921 Mammographic calcification found on diagnostic imaging of breast: Secondary | ICD-10-CM | POA: Diagnosis not present

## 2018-07-01 ENCOUNTER — Other Ambulatory Visit: Payer: Self-pay | Admitting: Family Medicine

## 2018-07-01 ENCOUNTER — Ambulatory Visit
Admission: RE | Admit: 2018-07-01 | Discharge: 2018-07-01 | Disposition: A | Discharge status: Home / Self Care | Payer: Commercial Managed Care - HMO | Source: Ambulatory Visit | Attending: Family Medicine | Admitting: Family Medicine

## 2018-07-01 DIAGNOSIS — R509 Fever, unspecified: Principal | ICD-10-CM

## 2018-07-01 DIAGNOSIS — Z87891 Personal history of nicotine dependence: Secondary | ICD-10-CM

## 2018-07-01 DIAGNOSIS — R059 Cough, unspecified: Secondary | ICD-10-CM

## 2018-07-01 DIAGNOSIS — R8290 Unspecified abnormal findings in urine: Secondary | ICD-10-CM | POA: Diagnosis not present

## 2018-07-01 DIAGNOSIS — R05 Cough: Secondary | ICD-10-CM

## 2018-07-01 DIAGNOSIS — K529 Noninfective gastroenteritis and colitis, unspecified: Secondary | ICD-10-CM | POA: Diagnosis not present

## 2018-07-09 DIAGNOSIS — N12 Tubulo-interstitial nephritis, not specified as acute or chronic: Secondary | ICD-10-CM | POA: Diagnosis not present

## 2018-07-15 ENCOUNTER — Other Ambulatory Visit: Payer: Self-pay | Admitting: Radiology

## 2018-07-15 DIAGNOSIS — D242 Benign neoplasm of left breast: Secondary | ICD-10-CM | POA: Diagnosis not present

## 2018-07-15 DIAGNOSIS — Z87440 Personal history of urinary (tract) infections: Secondary | ICD-10-CM | POA: Diagnosis not present

## 2018-07-15 DIAGNOSIS — N6012 Diffuse cystic mastopathy of left breast: Secondary | ICD-10-CM | POA: Diagnosis not present

## 2018-07-15 DIAGNOSIS — Z01818 Encounter for other preprocedural examination: Secondary | ICD-10-CM | POA: Diagnosis not present

## 2018-08-06 DIAGNOSIS — M25561 Pain in right knee: Secondary | ICD-10-CM | POA: Diagnosis not present

## 2018-08-06 DIAGNOSIS — M1711 Unilateral primary osteoarthritis, right knee: Secondary | ICD-10-CM | POA: Diagnosis not present

## 2018-08-21 DIAGNOSIS — R229 Localized swelling, mass and lump, unspecified: Secondary | ICD-10-CM | POA: Diagnosis not present

## 2018-08-26 DIAGNOSIS — L0293 Carbuncle, unspecified: Secondary | ICD-10-CM | POA: Diagnosis not present

## 2018-09-09 ENCOUNTER — Encounter: Payer: Self-pay | Admitting: Podiatry

## 2018-09-09 ENCOUNTER — Ambulatory Visit: Payer: PPO | Admitting: Podiatry

## 2018-09-09 DIAGNOSIS — L600 Ingrowing nail: Secondary | ICD-10-CM | POA: Diagnosis not present

## 2018-09-09 MED ORDER — NEOMYCIN-POLYMYXIN-HC 3.5-10000-1 OT SOLN: OTIC | 0 refills | Status: DC

## 2018-09-09 NOTE — Patient Instructions (Signed)

## 2018-09-09 NOTE — Progress Notes (Signed)
Subjective:   Patient ID: Samantha Jenkins, female   DOB: 77 y.o.   MRN: [id number]   HPI Patient presents stating she is had trouble with her left big toenail for a number of years and is gradually become more painful with palpation she had a removed once but it grew back very thickened and painful.  Patient does not smoke likes to be active   Review of Systems  All other systems reviewed and are negative.       Objective:  Physical Exam  Constitutional: She appears well-developed and well-nourished.  Cardiovascular: Intact distal pulses.  Pulmonary/Chest: Effort normal.  Musculoskeletal: Normal range of motion.  Neurological: She is alert.  Skin: Skin is warm.  Nursing note and vitals reviewed.   Neurovascular status intact muscle strength is adequate range of motion within normal limits with patient found to have a severely thickened dystrophic big nail left that is painful when pressed and make shoe gear difficult.  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  I reviewed the pros and cons and patient has decided on permanent removal and was given consent form which she read and signed understanding risk.  Today I infiltrated the left hallux 60 mg like Marcaine mixture with sterile instrumentation remove the hallux nail exposed matrix and applied phenol 5 applications 30 seconds followed by alcohol lavage and sterile dressing.  Instructed on leaving dressing on 24 hours but if she should start to develop discomfort I want her to take it off earlier and I encouraged her to call with any questions concerns she may have.

## 2018-09-23 ENCOUNTER — Ambulatory Visit (INDEPENDENT_AMBULATORY_CARE_PROVIDER_SITE_OTHER): Payer: Self-pay

## 2018-09-23 DIAGNOSIS — L600 Ingrowing nail: Secondary | ICD-10-CM

## 2018-09-23 NOTE — Patient Instructions (Signed)

## 2018-09-29 NOTE — Progress Notes (Signed)
Patient is here today for follow-up appointment, recent procedure performed 09/09/2018, removal of left hallux nail.  She states she is not having any pain at this time, and continues to soak.  No redness, no erythema, no swelling, no drainage, no other signs and symptoms of infection.  The area is scabbing over and appears to be healing well.  Discussed signs and symptoms of infection.  Verbal and written instructions were given to the patient.  She is to follow-up with any acute symptom changes.

## 2018-09-30 DIAGNOSIS — L720 Epidermal cyst: Secondary | ICD-10-CM | POA: Diagnosis not present

## 2018-12-09 DIAGNOSIS — M8580 Other specified disorders of bone density and structure, unspecified site: Secondary | ICD-10-CM | POA: Diagnosis not present

## 2018-12-09 DIAGNOSIS — F324 Major depressive disorder, single episode, in partial remission: Secondary | ICD-10-CM | POA: Diagnosis not present

## 2018-12-09 DIAGNOSIS — E7800 Pure hypercholesterolemia, unspecified: Secondary | ICD-10-CM | POA: Diagnosis not present

## 2018-12-09 DIAGNOSIS — Z0000 Encounter for general adult medical examination without abnormal findings: Secondary | ICD-10-CM | POA: Diagnosis not present

## 2018-12-09 DIAGNOSIS — Z23 Encounter for immunization: Secondary | ICD-10-CM | POA: Diagnosis not present

## 2018-12-09 DIAGNOSIS — I10 Essential (primary) hypertension: Secondary | ICD-10-CM | POA: Diagnosis not present

## 2018-12-09 DIAGNOSIS — M1990 Unspecified osteoarthritis, unspecified site: Secondary | ICD-10-CM | POA: Diagnosis not present

## 2018-12-09 DIAGNOSIS — Z1389 Encounter for screening for other disorder: Secondary | ICD-10-CM | POA: Diagnosis not present

## 2018-12-10 DIAGNOSIS — M1711 Unilateral primary osteoarthritis, right knee: Secondary | ICD-10-CM | POA: Diagnosis not present

## 2018-12-10 DIAGNOSIS — M25561 Pain in right knee: Secondary | ICD-10-CM | POA: Diagnosis not present

## 2019-01-14 DIAGNOSIS — R921 Mammographic calcification found on diagnostic imaging of breast: Secondary | ICD-10-CM | POA: Diagnosis not present

## 2019-01-14 DIAGNOSIS — Z803 Family history of malignant neoplasm of breast: Secondary | ICD-10-CM | POA: Diagnosis not present

## 2019-06-21 DIAGNOSIS — G4700 Insomnia, unspecified: Secondary | ICD-10-CM | POA: Diagnosis not present

## 2019-06-21 DIAGNOSIS — E7800 Pure hypercholesterolemia, unspecified: Secondary | ICD-10-CM | POA: Diagnosis not present

## 2019-06-21 DIAGNOSIS — F324 Major depressive disorder, single episode, in partial remission: Secondary | ICD-10-CM | POA: Diagnosis not present

## 2019-06-21 DIAGNOSIS — I10 Essential (primary) hypertension: Secondary | ICD-10-CM | POA: Diagnosis not present

## 2019-07-31 DIAGNOSIS — Z23 Encounter for immunization: Secondary | ICD-10-CM | POA: Diagnosis not present

## 2019-08-10 DIAGNOSIS — M25562 Pain in left knee: Secondary | ICD-10-CM | POA: Diagnosis not present

## 2019-08-10 DIAGNOSIS — M1711 Unilateral primary osteoarthritis, right knee: Secondary | ICD-10-CM | POA: Diagnosis not present

## 2019-08-10 DIAGNOSIS — M25561 Pain in right knee: Secondary | ICD-10-CM | POA: Diagnosis not present

## 2019-08-10 DIAGNOSIS — M1712 Unilateral primary osteoarthritis, left knee: Secondary | ICD-10-CM | POA: Diagnosis not present

## 2019-09-01 DIAGNOSIS — D1801 Hemangioma of skin and subcutaneous tissue: Secondary | ICD-10-CM | POA: Diagnosis not present

## 2019-09-01 DIAGNOSIS — D692 Other nonthrombocytopenic purpura: Secondary | ICD-10-CM | POA: Diagnosis not present

## 2019-09-01 DIAGNOSIS — D225 Melanocytic nevi of trunk: Secondary | ICD-10-CM | POA: Diagnosis not present

## 2019-09-01 DIAGNOSIS — D1723 Benign lipomatous neoplasm of skin and subcutaneous tissue of right leg: Secondary | ICD-10-CM | POA: Diagnosis not present

## 2019-09-01 DIAGNOSIS — L821 Other seborrheic keratosis: Secondary | ICD-10-CM | POA: Diagnosis not present

## 2019-09-24 DIAGNOSIS — R319 Hematuria, unspecified: Secondary | ICD-10-CM | POA: Diagnosis not present

## 2019-09-24 DIAGNOSIS — N3090 Cystitis, unspecified without hematuria: Secondary | ICD-10-CM | POA: Diagnosis not present

## 2019-09-24 DIAGNOSIS — F324 Major depressive disorder, single episode, in partial remission: Secondary | ICD-10-CM | POA: Diagnosis not present

## 2019-10-26 DIAGNOSIS — R3121 Asymptomatic microscopic hematuria: Secondary | ICD-10-CM | POA: Diagnosis not present

## 2019-11-03 DIAGNOSIS — N200 Calculus of kidney: Secondary | ICD-10-CM | POA: Diagnosis not present

## 2019-11-03 DIAGNOSIS — R3121 Asymptomatic microscopic hematuria: Secondary | ICD-10-CM | POA: Diagnosis not present

## 2019-11-16 DIAGNOSIS — N200 Calculus of kidney: Secondary | ICD-10-CM | POA: Diagnosis not present

## 2019-11-22 DIAGNOSIS — Z20828 Contact with and (suspected) exposure to other viral communicable diseases: Secondary | ICD-10-CM | POA: Diagnosis not present

## 2019-11-22 DIAGNOSIS — Z20822 Contact with and (suspected) exposure to covid-19: Secondary | ICD-10-CM | POA: Diagnosis not present

## 2019-12-11 ENCOUNTER — Ambulatory Visit: Payer: PPO | Attending: Internal Medicine

## 2019-12-11 DIAGNOSIS — Z23 Encounter for immunization: Secondary | ICD-10-CM

## 2019-12-11 NOTE — Progress Notes (Signed)
   Covid-19 Vaccination Clinic  Name:  Samantha Jenkins    MRN: [id number] DOB: 02/22/1941  12/11/2019  Samantha Jenkins was observed post Covid-19 immunization for 15 minutes without incidence. She was provided with Vaccine Information Sheet and instruction to access the V-Safe system.   Samantha Jenkins was instructed to call 911 with any severe reactions post vaccine: Marland Kitchen Difficulty breathing  . Swelling of your face and throat  . A fast heartbeat  . A bad rash all over your body  . Dizziness and weakness    Immunizations Administered    Name Date Dose VIS Date Route   Pfizer COVID-19 Vaccine 12/11/2019 11:11 AM 0.3 mL 10/29/2019 Intramuscular   Manufacturer: [employer]   Lot: [id number]   [place]: [id number]

## 2019-12-24 DIAGNOSIS — M25561 Pain in right knee: Secondary | ICD-10-CM | POA: Diagnosis not present

## 2019-12-24 DIAGNOSIS — M1711 Unilateral primary osteoarthritis, right knee: Secondary | ICD-10-CM | POA: Diagnosis not present

## 2020-01-01 ENCOUNTER — Ambulatory Visit: Payer: PPO | Attending: Internal Medicine

## 2020-01-01 DIAGNOSIS — Z23 Encounter for immunization: Secondary | ICD-10-CM

## 2020-01-01 NOTE — Progress Notes (Signed)
   Covid-19 Vaccination Clinic  Name:  Samantha Jenkins    MRN: [id number] DOB: 1941/06/28  01/01/2020  Samantha Jenkins was observed post Covid-19 immunization for 15 minutes without incidence. She was provided with Vaccine Information Sheet and instruction to access the V-Safe system.   Samantha Jenkins was instructed to call 911 with any severe reactions post vaccine: Marland Kitchen Difficulty breathing  . Swelling of your face and throat  . A fast heartbeat  . A bad rash all over your body  . Dizziness and weakness    Immunizations Administered    Name Date Dose VIS Date Route   Pfizer COVID-19 Vaccine 01/01/2020 10:04 AM 0.3 mL 10/29/2019 Intramuscular   Manufacturer: [place]   Lot: [id number]   [place]: [id number]

## 2020-01-03 DIAGNOSIS — Z1211 Encounter for screening for malignant neoplasm of colon: Secondary | ICD-10-CM | POA: Diagnosis not present

## 2020-01-03 DIAGNOSIS — I10 Essential (primary) hypertension: Secondary | ICD-10-CM | POA: Diagnosis not present

## 2020-01-03 DIAGNOSIS — M8580 Other specified disorders of bone density and structure, unspecified site: Secondary | ICD-10-CM | POA: Diagnosis not present

## 2020-01-03 DIAGNOSIS — E7800 Pure hypercholesterolemia, unspecified: Secondary | ICD-10-CM | POA: Diagnosis not present

## 2020-01-03 DIAGNOSIS — Z0000 Encounter for general adult medical examination without abnormal findings: Secondary | ICD-10-CM | POA: Diagnosis not present

## 2020-01-03 DIAGNOSIS — J45909 Unspecified asthma, uncomplicated: Secondary | ICD-10-CM | POA: Diagnosis not present

## 2020-01-03 DIAGNOSIS — M1990 Unspecified osteoarthritis, unspecified site: Secondary | ICD-10-CM | POA: Diagnosis not present

## 2020-01-03 DIAGNOSIS — Z1389 Encounter for screening for other disorder: Secondary | ICD-10-CM | POA: Diagnosis not present

## 2020-01-03 DIAGNOSIS — G4700 Insomnia, unspecified: Secondary | ICD-10-CM | POA: Diagnosis not present

## 2020-01-03 DIAGNOSIS — F324 Major depressive disorder, single episode, in partial remission: Secondary | ICD-10-CM | POA: Diagnosis not present

## 2020-01-03 DIAGNOSIS — N200 Calculus of kidney: Secondary | ICD-10-CM | POA: Diagnosis not present

## 2020-01-19 DIAGNOSIS — Z1231 Encounter for screening mammogram for malignant neoplasm of breast: Secondary | ICD-10-CM | POA: Diagnosis not present

## 2020-01-19 DIAGNOSIS — M85852 Other specified disorders of bone density and structure, left thigh: Secondary | ICD-10-CM | POA: Diagnosis not present

## 2020-01-19 DIAGNOSIS — M85851 Other specified disorders of bone density and structure, right thigh: Secondary | ICD-10-CM | POA: Diagnosis not present

## 2020-01-19 DIAGNOSIS — Z780 Asymptomatic menopausal state: Secondary | ICD-10-CM | POA: Diagnosis not present

## 2020-01-25 DIAGNOSIS — Z20822 Contact with and (suspected) exposure to covid-19: Secondary | ICD-10-CM | POA: Diagnosis not present

## 2020-01-26 IMAGING — CR DG CHEST 2V
2 series · 2 of 2 positions shown · non-contrast
Comparison: 06/22/2010

CLINICAL DATA: Productive cough, fever

EXAM:
CHEST - 2 VIEW

[w chest pa]
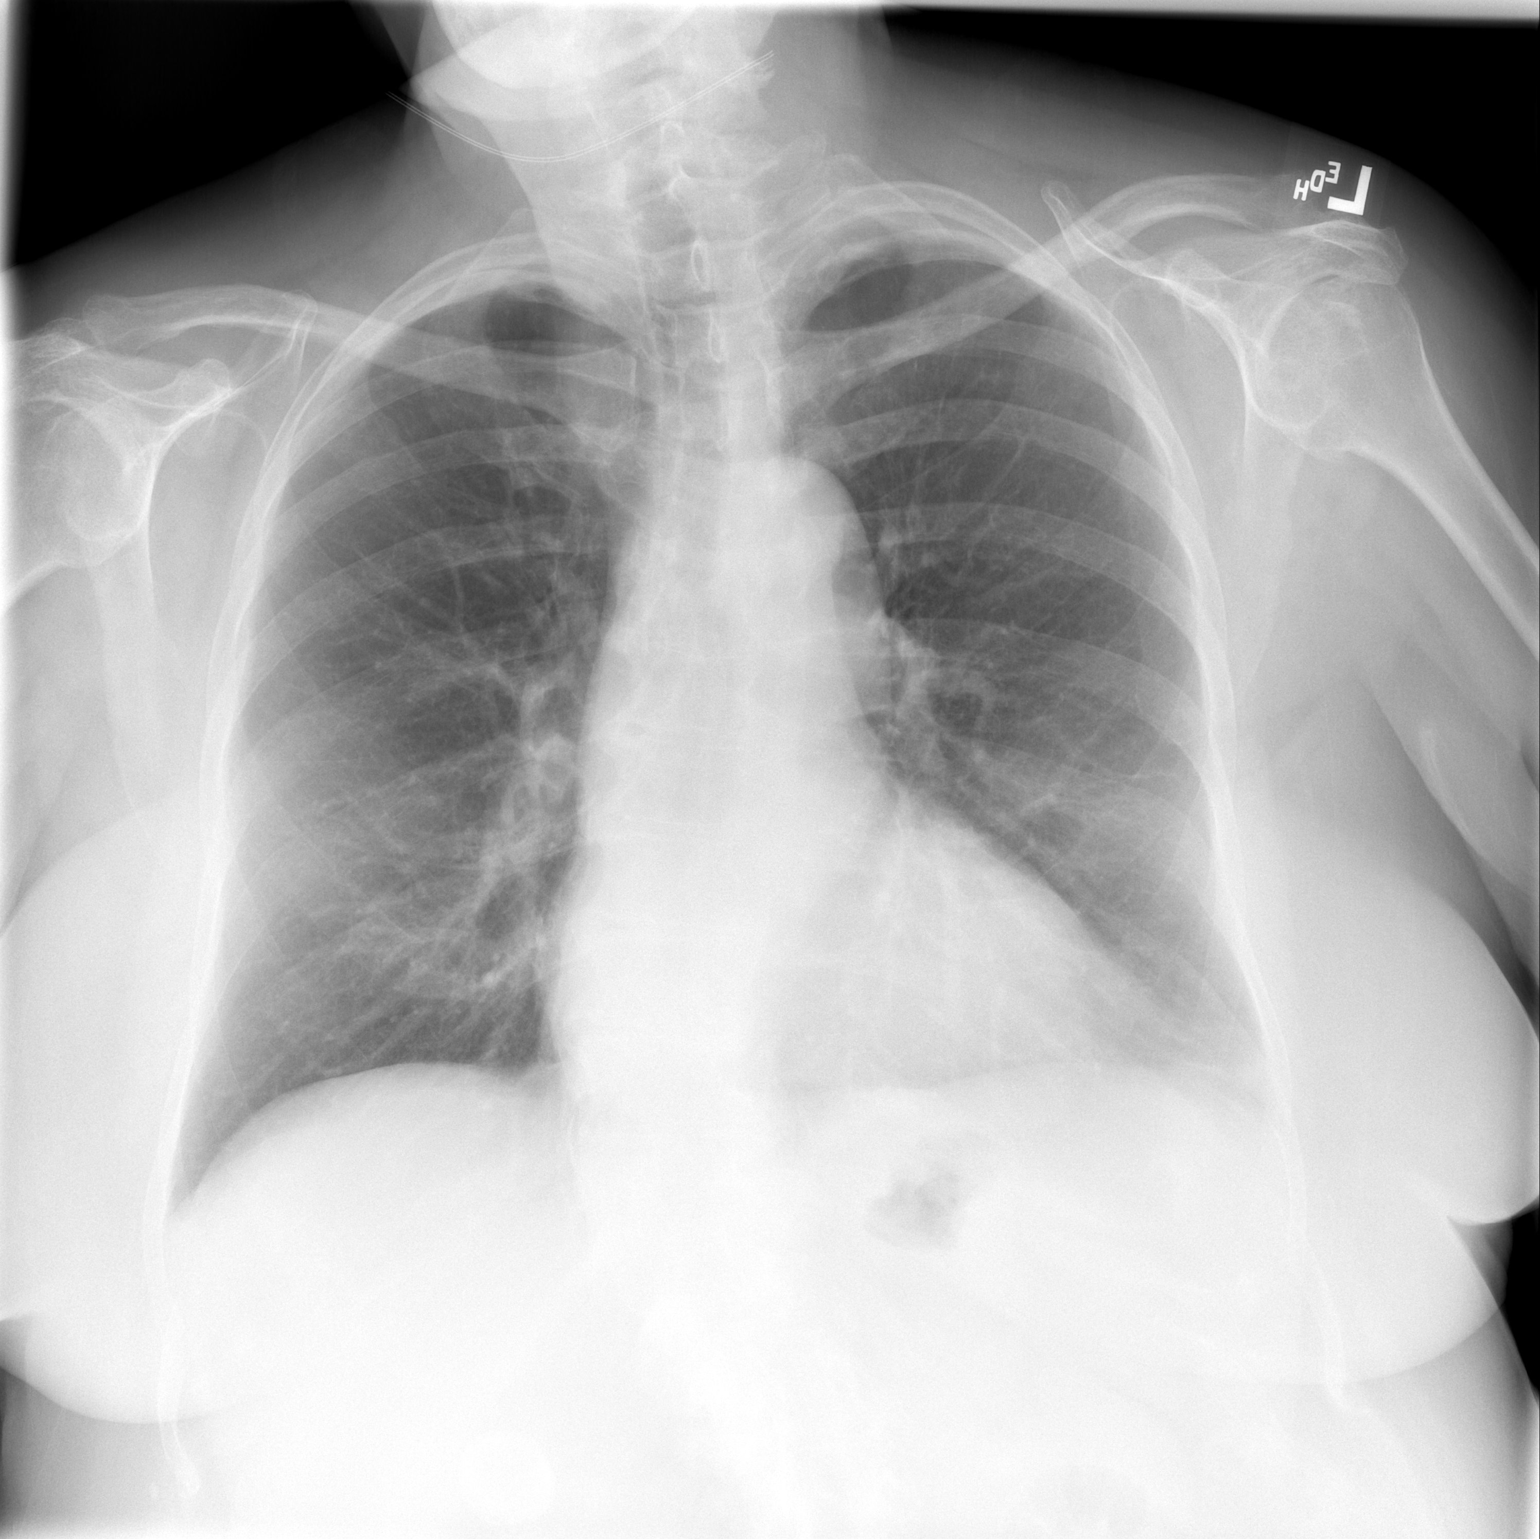

[w chest lat]
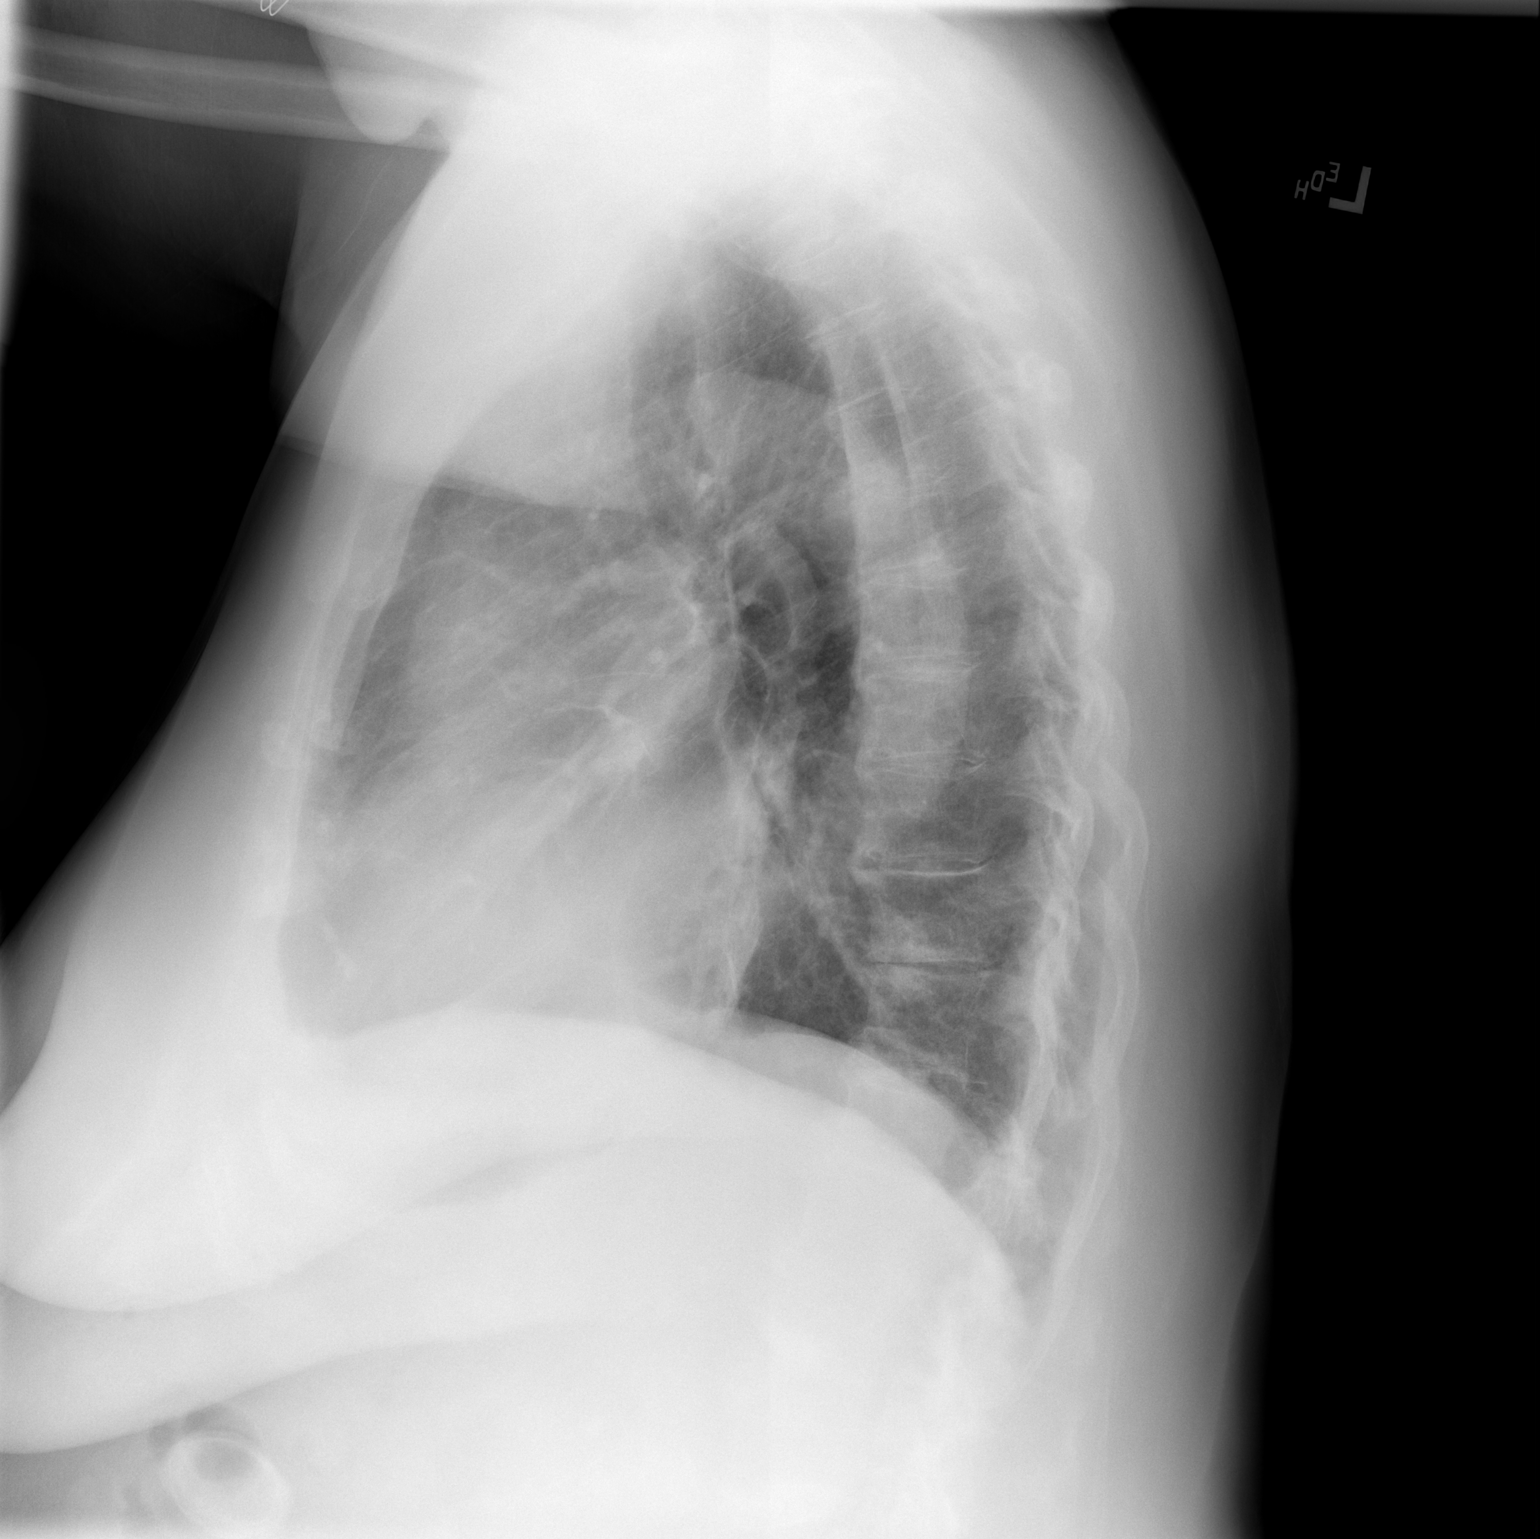

[2 of 2 positions shown; findings below may reference images not displayed]

FINDINGS: The heart size and mediastinal contours are within normal limits.
Both lungs are clear. The visualized skeletal structures are
unremarkable.
IMPRESSION: No active cardiopulmonary disease.

## 2020-01-27 DIAGNOSIS — N6489 Other specified disorders of breast: Secondary | ICD-10-CM | POA: Diagnosis not present

## 2020-01-27 DIAGNOSIS — R928 Other abnormal and inconclusive findings on diagnostic imaging of breast: Secondary | ICD-10-CM | POA: Diagnosis not present

## 2020-02-14 DIAGNOSIS — R3121 Asymptomatic microscopic hematuria: Secondary | ICD-10-CM | POA: Diagnosis not present

## 2020-02-14 DIAGNOSIS — N200 Calculus of kidney: Secondary | ICD-10-CM | POA: Diagnosis not present

## 2020-02-15 ENCOUNTER — Other Ambulatory Visit: Payer: Self-pay | Admitting: Urology

## 2020-03-09 NOTE — Patient Instructions (Addendum)
DUE TO COVID-19 ONLY ONE VISITOR IS ALLOWED TO COME WITH YOU AND STAY IN THE WAITING ROOM ONLY DURING PRE OP AND PROCEDURE DAY OF SURGERY. TWO VISITOR MAY VISIT WITH YOU AFTER SURGERY IN YOUR PRIVATE ROOM DURING VISITING HOURS ONLY!  10 am -8pm  YOU NEED TO HAVE A COVID 19 TEST ON____5-3-21___ @__1100  am_____, THIS TEST MUST BE DONE BEFORE SURGERY, COME  [address], [place] , [address].  ([place]) ONCE YOUR COVID TEST IS COMPLETED, PLEASE BEGIN THE QUARANTINE INSTRUCTIONS AS OUTLINED IN YOUR HANDOUT.                Samantha Jenkins  03/09/2020   Your procedure is scheduled on: 03-23-20   Report to [facility] Main  Entrance   Report to admitting at       [place]  AM     Call this number if you have problems the morning of surgery [phone]    Remember: Do not eat food or drink liquids :After Midnight.   BRUSH YOUR TEETH MORNING OF SURGERY AND RINSE YOUR MOUTH OUT, NO CHEWING GUM CANDY OR MINTS.     Take these medicines the morning of surgery with A SIP OF WATER: inhaler if needed and bring with you                                 You may not have any metal on your body including hair pins and              piercings  Do not wear jewelry, make-up, lotions, powders or perfumes, deodorant             Do not wear nail polish on your fingernails.  Do not shave  48 hours prior to surgery.                Do not bring valuables to the hospital. [place].  Contacts, dentures or bridgework may not be worn into surgery.      Patients discharged the day of surgery will not be allowed to drive home. IF YOU ARE HAVING SURGERY AND GOING HOME THE SAME DAY, YOU MUST HAVE AN ADULT TO DRIVE YOU HOME AND BE WITH YOU FOR 24 HOURS. YOU MAY GO HOME BY TAXI OR UBER OR ORTHERWISE, BUT AN ADULT MUST ACCOMPANY YOU HOME AND STAY WITH YOU FOR 24 HOURS.  Name and phone number of your driver:  Special Instructions: N/A       Please read over the following fact sheets you were given: _____________________________________________________________________             [facility] - Preparing for Surgery Before surgery, you can play an important role.  Because skin is not sterile, your skin needs to be as free of germs as possible.  You can reduce the number of germs on your skin by washing with CHG (chlorahexidine gluconate) soap before surgery.  CHG is an antiseptic cleaner which kills germs and bonds with the skin to continue killing germs even after washing. Please DO NOT use if you have an allergy to CHG or antibacterial soaps.  If your skin becomes reddened/irritated stop using the CHG and inform your nurse when you arrive at Short Stay. Do not shave (including legs and underarms) for at least  48 hours prior to the first CHG shower.  You may shave your face/neck. Please follow these instructions carefully:  1.  Shower with CHG Soap the night before surgery and the  morning of Surgery.  2.  If you choose to wash your hair, wash your hair first as usual with your  normal  shampoo.  3.  After you shampoo, rinse your hair and body thoroughly to remove the  shampoo.                           4.  Use CHG as you would any other liquid soap.  You can apply chg directly  to the skin and wash                       Gently with a scrungie or clean washcloth.  5.  Apply the CHG Soap to your body ONLY FROM THE NECK DOWN.   Do not use on face/ open                           Wound or open sores. Avoid contact with eyes, ears mouth and genitals (private parts).                       Wash face,  Genitals (private parts) with your normal soap.             6.  Wash thoroughly, paying special attention to the area where your surgery  will be performed.  7.  Thoroughly rinse your body with warm water from the neck down.  8.  DO NOT shower/wash with your normal soap after using and rinsing off  the CHG Soap.                9.  Pat  yourself dry with a clean towel.            10.  Wear clean pajamas.            11.  Place clean sheets on your bed the night of your first shower and do not  sleep with pets. Day of Surgery : Do not apply any lotions/deodorants the morning of surgery.  Please wear clean clothes to the hospital/surgery center.  FAILURE TO FOLLOW THESE INSTRUCTIONS MAY RESULT IN THE CANCELLATION OF YOUR SURGERY PATIENT SIGNATURE_________________________________  NURSE SIGNATURE__________________________________  ________________________________________________________________________

## 2020-03-09 NOTE — Progress Notes (Signed)
PCP - Kelton Pillar Cardiologist - saw Dr. Lyman Bishop  lov 04-08-13 epic  Chest x-ray -  EKG - to be done 03-13-20 Stress Test -  ECHO -  Cardiac Cath -   Sleep Study -  CPAP -   Fasting Blood Sugar -  Checks Blood Sugar _____ times a day  Blood Thinner Instructions: Aspirin Instructions: Last Dose:  Anesthesia review: HX of   Chronic LBBB  Patient denies shortness of breath, fever, cough and chest pain at PAT appointment   NONE   Patient verbalized understanding of instructions that were given to them at the PAT appointment. Patient was also instructed that they will need to review over the PAT instructions again at home before surgery.

## 2020-03-10 ENCOUNTER — Encounter (HOSPITAL_COMMUNITY): Payer: Self-pay

## 2020-03-10 ENCOUNTER — Other Ambulatory Visit: Payer: Self-pay

## 2020-03-10 ENCOUNTER — Encounter (HOSPITAL_COMMUNITY)
Admission: RE | Admit: 2020-03-10 | Discharge: 2020-03-10 | Disposition: A | Discharge status: Home / Self Care | Payer: PPO | Source: Ambulatory Visit | Attending: Urology | Admitting: Urology

## 2020-03-10 DIAGNOSIS — R001 Bradycardia, unspecified: Secondary | ICD-10-CM | POA: Diagnosis not present

## 2020-03-10 DIAGNOSIS — I447 Left bundle-branch block, unspecified: Secondary | ICD-10-CM | POA: Insufficient documentation

## 2020-03-10 DIAGNOSIS — Z01818 Encounter for other preprocedural examination: Secondary | ICD-10-CM | POA: Insufficient documentation

## 2020-03-10 HISTORY — DX: Pneumonia, unspecified organism: J18.9

## 2020-03-10 HISTORY — DX: Unspecified osteoarthritis, unspecified site: M19.90

## 2020-03-10 HISTORY — DX: Personal history of urinary calculi: Z87.442

## 2020-03-10 HISTORY — DX: Unspecified asthma, uncomplicated: J45.909

## 2020-03-13 ENCOUNTER — Other Ambulatory Visit: Payer: Self-pay

## 2020-03-13 ENCOUNTER — Encounter (HOSPITAL_COMMUNITY)
Admission: RE | Admit: 2020-03-13 | Discharge: 2020-03-13 | Disposition: A | Discharge status: Home / Self Care | Payer: PPO | Source: Ambulatory Visit | Attending: Urology | Admitting: Urology

## 2020-03-13 DIAGNOSIS — I447 Left bundle-branch block, unspecified: Secondary | ICD-10-CM | POA: Diagnosis not present

## 2020-03-13 DIAGNOSIS — Z01818 Encounter for other preprocedural examination: Secondary | ICD-10-CM | POA: Diagnosis not present

## 2020-03-13 DIAGNOSIS — R001 Bradycardia, unspecified: Secondary | ICD-10-CM | POA: Diagnosis not present

## 2020-03-13 LAB — COMPREHENSIVE METABOLIC PANEL
ALT: 16 U/L (ref 0–44)
AST: 23 U/L (ref 15–41)
Albumin: 4.2 g/dL (ref 3.5–5.0)
Alkaline Phosphatase: 102 U/L (ref 38–126)
Anion gap: 9 (ref 5–15)
BUN: 17 mg/dL (ref 8–23)
CO2: 25 mmol/L (ref 22–32)
Calcium: 9.5 mg/dL (ref 8.9–10.3)
Chloride: 107 mmol/L (ref 98–111)
Creatinine, Ser: 0.72 mg/dL (ref 0.44–1.00)
GFR calc Af Amer: >60 mL/min (ref >60)
GFR calc non Af Amer: >60 mL/min (ref >60)
Glucose, Bld: 103 mg/dL — ABNORMAL HIGH (ref 70–99)
Potassium: 3.8 mmol/L (ref 3.5–5.1)
Sodium: 141 mmol/L (ref 135–145)
Total Bilirubin: 0.8 mg/dL (ref 0.3–1.2)
Total Protein: 7 g/dL (ref 6.5–8.1)

## 2020-03-13 LAB — CBC
HCT: 43 % (ref 36.0–46.0)
Hemoglobin: 14.4 g/dL (ref 12.0–15.0)
MCH: 32.9 pg (ref 26.0–34.0)
MCHC: 33.5 g/dL (ref 30.0–36.0)
MCV: 98.2 fL (ref 80.0–100.0)
Platelets: 220 10*3/uL (ref 150–400)
RBC: 4.38 MIL/uL (ref 3.87–5.11)
RDW: 13.7 % (ref 11.5–15.5)
WBC: 7.5 10*3/uL (ref 4.0–10.5)
nRBC: 0 % (ref 0.0–0.2)

## 2020-03-13 LAB — ABO/RH: ABO/RH(D): O POS

## 2020-03-20 ENCOUNTER — Other Ambulatory Visit (HOSPITAL_COMMUNITY)
Admission: RE | Admit: 2020-03-20 | Discharge: 2020-03-20 | Disposition: A | Discharge status: Home / Self Care | Payer: PPO | Source: Ambulatory Visit | Attending: Urology | Admitting: Urology

## 2020-03-20 DIAGNOSIS — Z20822 Contact with and (suspected) exposure to covid-19: Secondary | ICD-10-CM | POA: Diagnosis not present

## 2020-03-20 DIAGNOSIS — Z01812 Encounter for preprocedural laboratory examination: Secondary | ICD-10-CM | POA: Insufficient documentation

## 2020-03-20 LAB — SARS CORONAVIRUS 2 (TAT 6-24 HRS): SARS Coronavirus 2: NEGATIVE

## 2020-03-23 ENCOUNTER — Encounter (HOSPITAL_COMMUNITY)
Admission: RE | Disposition: A | Discharge status: Home / Self Care | Payer: Self-pay | Source: Home / Self Care | Attending: Urology

## 2020-03-23 ENCOUNTER — Ambulatory Visit (HOSPITAL_COMMUNITY): Payer: PPO | Admitting: Physician Assistant

## 2020-03-23 ENCOUNTER — Observation Stay (HOSPITAL_COMMUNITY)
Admission: RE | Admit: 2020-03-23 | Discharge: 2020-03-24 | Disposition: A | Discharge status: Home / Self Care | Payer: PPO | Attending: Urology | Admitting: Urology

## 2020-03-23 ENCOUNTER — Encounter (HOSPITAL_COMMUNITY): Payer: Self-pay | Admitting: Urology

## 2020-03-23 ENCOUNTER — Ambulatory Visit (HOSPITAL_COMMUNITY): Payer: PPO

## 2020-03-23 ENCOUNTER — Other Ambulatory Visit: Payer: Self-pay

## 2020-03-23 ENCOUNTER — Ambulatory Visit (HOSPITAL_COMMUNITY): Payer: PPO | Admitting: Certified Registered Nurse Anesthetist

## 2020-03-23 DIAGNOSIS — I10 Essential (primary) hypertension: Secondary | ICD-10-CM | POA: Insufficient documentation

## 2020-03-23 DIAGNOSIS — E785 Hyperlipidemia, unspecified: Secondary | ICD-10-CM | POA: Diagnosis not present

## 2020-03-23 DIAGNOSIS — E7800 Pure hypercholesterolemia, unspecified: Secondary | ICD-10-CM | POA: Insufficient documentation

## 2020-03-23 DIAGNOSIS — Z87891 Personal history of nicotine dependence: Secondary | ICD-10-CM | POA: Diagnosis not present

## 2020-03-23 DIAGNOSIS — Z6834 Body mass index (BMI) 34.0-34.9, adult: Secondary | ICD-10-CM | POA: Insufficient documentation

## 2020-03-23 DIAGNOSIS — Z85828 Personal history of other malignant neoplasm of skin: Secondary | ICD-10-CM | POA: Insufficient documentation

## 2020-03-23 DIAGNOSIS — Z79899 Other long term (current) drug therapy: Secondary | ICD-10-CM | POA: Insufficient documentation

## 2020-03-23 DIAGNOSIS — Z882 Allergy status to sulfonamides status: Secondary | ICD-10-CM | POA: Insufficient documentation

## 2020-03-23 DIAGNOSIS — E669 Obesity, unspecified: Secondary | ICD-10-CM | POA: Diagnosis not present

## 2020-03-23 DIAGNOSIS — N200 Calculus of kidney: Secondary | ICD-10-CM | POA: Diagnosis not present

## 2020-03-23 DIAGNOSIS — I447 Left bundle-branch block, unspecified: Secondary | ICD-10-CM | POA: Diagnosis not present

## 2020-03-23 DIAGNOSIS — F329 Major depressive disorder, single episode, unspecified: Secondary | ICD-10-CM | POA: Diagnosis not present

## 2020-03-23 DIAGNOSIS — Z888 Allergy status to other drugs, medicaments and biological substances status: Secondary | ICD-10-CM | POA: Diagnosis not present

## 2020-03-23 HISTORY — PX: NEPHROLITHOTOMY: SHX5134

## 2020-03-23 LAB — CBC
HCT: 37.5 % (ref 36.0–46.0)
Hemoglobin: 12.7 g/dL (ref 12.0–15.0)
MCH: 33.8 pg (ref 26.0–34.0)
MCHC: 33.9 g/dL (ref 30.0–36.0)
MCV: 99.7 fL (ref 80.0–100.0)
Platelets: 202 10*3/uL (ref 150–400)
RBC: 3.76 MIL/uL — ABNORMAL LOW (ref 3.87–5.11)
RDW: 13.7 % (ref 11.5–15.5)
WBC: 9.8 10*3/uL (ref 4.0–10.5)
nRBC: 0 % (ref 0.0–0.2)

## 2020-03-23 LAB — TYPE AND SCREEN
ABO/RH(D): O POS
Antibody Screen: NEGATIVE

## 2020-03-23 SURGERY — NEPHROLITHOTOMY PERCUTANEOUS
Anesthesia: General | Laterality: Left

## 2020-03-23 MED ORDER — ROCURONIUM BROMIDE 10 MG/ML (PF) SYRINGE
PREFILLED_SYRINGE | INTRAVENOUS | Status: AC
  Filled 2020-03-23: qty 10

## 2020-03-23 MED ORDER — BUPIVACAINE-EPINEPHRINE 0.5% -1:200000 IJ SOLN
INTRAMUSCULAR | Status: AC
  Filled 2020-03-23: qty 1

## 2020-03-23 MED ORDER — ONDANSETRON HCL 4 MG PO TABS: 4.0000 mg | ORAL_TABLET | Freq: Three times a day (TID) | ORAL | Status: DC | PRN

## 2020-03-23 MED ORDER — CHLORHEXIDINE GLUCONATE CLOTH 2 % EX PADS
6.0000 | MEDICATED_PAD | Freq: Every day | CUTANEOUS | Status: DC
  Administered 2020-03-23: 6 via TOPICAL

## 2020-03-23 MED ORDER — SODIUM CHLORIDE 0.45 % IV SOLN: INTRAVENOUS | Status: DC

## 2020-03-23 MED ORDER — DEXAMETHASONE SODIUM PHOSPHATE 10 MG/ML IJ SOLN
INTRAMUSCULAR | Status: AC
  Filled 2020-03-23: qty 1

## 2020-03-23 MED ORDER — ONDANSETRON HCL 4 MG/2ML IJ SOLN: 4.0000 mg | Freq: Four times a day (QID) | INTRAMUSCULAR | Status: DC | PRN

## 2020-03-23 MED ORDER — LIDOCAINE 2% (20 MG/ML) 5 ML SYRINGE
INTRAMUSCULAR | Status: AC
  Filled 2020-03-23: qty 5

## 2020-03-23 MED ORDER — ONDANSETRON HCL 4 MG/2ML IJ SOLN
INTRAMUSCULAR | Status: AC
  Filled 2020-03-23: qty 2

## 2020-03-23 MED ORDER — FENTANYL CITRATE (PF) 250 MCG/5ML IJ SOLN
INTRAMUSCULAR | Status: AC
  Filled 2020-03-23: qty 5

## 2020-03-23 MED ORDER — PHENYLEPHRINE 40 MCG/ML (10ML) SYRINGE FOR IV PUSH (FOR BLOOD PRESSURE SUPPORT)
PREFILLED_SYRINGE | INTRAVENOUS | Status: DC | PRN
  Administered 2020-03-23 (×2): 40 ug via INTRAVENOUS
  Administered 2020-03-23: 120 ug via INTRAVENOUS
  Administered 2020-03-23 (×2): 80 ug via INTRAVENOUS
  Administered 2020-03-23: 40 ug via INTRAVENOUS
  Administered 2020-03-23: 80 ug via INTRAVENOUS
  Administered 2020-03-23 (×2): 40 ug via INTRAVENOUS
  Administered 2020-03-23: 80 ug via INTRAVENOUS
  Administered 2020-03-23: 40 ug via INTRAVENOUS
  Administered 2020-03-23 (×2): 80 ug via INTRAVENOUS

## 2020-03-23 MED ORDER — BUPIVACAINE-EPINEPHRINE 0.5% -1:200000 IJ SOLN
INTRAMUSCULAR | Status: DC | PRN
  Administered 2020-03-23: 10 mL

## 2020-03-23 MED ORDER — LACTATED RINGERS IV SOLN: INTRAVENOUS | Status: DC

## 2020-03-23 MED ORDER — BISOPROLOL-HYDROCHLOROTHIAZIDE 5-6.25 MG PO TABS
1.0000 | ORAL_TABLET | Freq: Every day | ORAL | Status: DC
  Administered 2020-03-23: 1 via ORAL
  Filled 2020-03-23: qty 1

## 2020-03-23 MED ORDER — SERTRALINE HCL 100 MG PO TABS
100.0000 mg | ORAL_TABLET | Freq: Every day | ORAL | Status: DC
  Administered 2020-03-23: 100 mg via ORAL
  Filled 2020-03-23: qty 1

## 2020-03-23 MED ORDER — PROPOFOL 10 MG/ML IV BOLUS
INTRAVENOUS | Status: DC | PRN
  Administered 2020-03-23: 120 mg via INTRAVENOUS

## 2020-03-23 MED ORDER — ALBUTEROL SULFATE HFA 108 (90 BASE) MCG/ACT IN AERS: 2.0000 | INHALATION_SPRAY | Freq: Four times a day (QID) | RESPIRATORY_TRACT | Status: DC | PRN

## 2020-03-23 MED ORDER — TRAMADOL HCL 50 MG PO TABS: 50.0000 mg | ORAL_TABLET | Freq: Four times a day (QID) | ORAL | Status: DC | PRN

## 2020-03-23 MED ORDER — PROPOFOL 10 MG/ML IV BOLUS
INTRAVENOUS | Status: AC
  Filled 2020-03-23: qty 20

## 2020-03-23 MED ORDER — IOHEXOL 300 MG/ML  SOLN
INTRAMUSCULAR | Status: DC | PRN
  Administered 2020-03-23: 50 mL

## 2020-03-23 MED ORDER — 0.9 % SODIUM CHLORIDE (POUR BTL) OPTIME
TOPICAL | Status: DC | PRN
  Administered 2020-03-23: 1000 mL

## 2020-03-23 MED ORDER — ACETAMINOPHEN 500 MG PO TABS
ORAL_TABLET | ORAL | Status: AC
  Administered 2020-03-23: 1000 mg via ORAL
  Filled 2020-03-23: qty 2

## 2020-03-23 MED ORDER — ZOLPIDEM TARTRATE 5 MG PO TABS: 5.0000 mg | ORAL_TABLET | Freq: Every evening | ORAL | Status: DC | PRN

## 2020-03-23 MED ORDER — LABETALOL HCL 5 MG/ML IV SOLN
INTRAVENOUS | Status: AC
  Filled 2020-03-23: qty 4

## 2020-03-23 MED ORDER — PHENYLEPHRINE 40 MCG/ML (10ML) SYRINGE FOR IV PUSH (FOR BLOOD PRESSURE SUPPORT)
PREFILLED_SYRINGE | INTRAVENOUS | Status: AC
  Filled 2020-03-23: qty 20

## 2020-03-23 MED ORDER — HYDROMORPHONE HCL 1 MG/ML IJ SOLN
0.5000 mg | INTRAMUSCULAR | Status: DC | PRN
  Administered 2020-03-23: 0.5 mg via INTRAVENOUS

## 2020-03-23 MED ORDER — SODIUM CHLORIDE 0.9 % IR SOLN
Status: DC | PRN
  Administered 2020-03-23: 24000 mL
  Administered 2020-03-23: 12000 mL

## 2020-03-23 MED ORDER — ACETAMINOPHEN 500 MG PO TABS: 1000.0000 mg | ORAL_TABLET | Freq: Once | ORAL | Status: AC

## 2020-03-23 MED ORDER — HYDROMORPHONE HCL 1 MG/ML IJ SOLN
INTRAMUSCULAR | Status: AC
  Filled 2020-03-23: qty 1

## 2020-03-23 MED ORDER — FENTANYL CITRATE (PF) 100 MCG/2ML IJ SOLN
INTRAMUSCULAR | Status: DC | PRN
  Administered 2020-03-23 (×3): 50 ug via INTRAVENOUS
  Administered 2020-03-23: 100 ug via INTRAVENOUS
  Administered 2020-03-23 (×2): 50 ug via INTRAVENOUS

## 2020-03-23 MED ORDER — PRAVASTATIN SODIUM 40 MG PO TABS
40.0000 mg | ORAL_TABLET | Freq: Every evening | ORAL | Status: DC
  Administered 2020-03-23: 40 mg via ORAL
  Filled 2020-03-23: qty 1

## 2020-03-23 MED ORDER — FENTANYL CITRATE (PF) 100 MCG/2ML IJ SOLN: 25.0000 ug | INTRAMUSCULAR | Status: DC | PRN

## 2020-03-23 MED ORDER — CIPROFLOXACIN IN D5W 400 MG/200ML IV SOLN
400.0000 mg | INTRAVENOUS | Status: AC
  Administered 2020-03-23: 400 mg via INTRAVENOUS
  Filled 2020-03-23: qty 200

## 2020-03-23 MED ORDER — ACETAMINOPHEN 10 MG/ML IV SOLN
1000.0000 mg | Freq: Four times a day (QID) | INTRAVENOUS | Status: DC
  Administered 2020-03-23 – 2020-03-24 (×3): 1000 mg via INTRAVENOUS
  Filled 2020-03-23 (×4): qty 100

## 2020-03-23 MED ORDER — ALBUTEROL SULFATE (2.5 MG/3ML) 0.083% IN NEBU: 2.5000 mg | INHALATION_SOLUTION | Freq: Four times a day (QID) | RESPIRATORY_TRACT | Status: DC | PRN

## 2020-03-23 MED ORDER — LIDOCAINE 2% (20 MG/ML) 5 ML SYRINGE
INTRAMUSCULAR | Status: DC | PRN
  Administered 2020-03-23: 80 mg via INTRAVENOUS

## 2020-03-23 MED ORDER — CEFAZOLIN SODIUM-DEXTROSE 2-4 GM/100ML-% IV SOLN
2.0000 g | INTRAVENOUS | Status: AC
  Administered 2020-03-23: 2 g via INTRAVENOUS
  Filled 2020-03-23: qty 100

## 2020-03-23 MED ORDER — LABETALOL HCL 5 MG/ML IV SOLN
5.0000 mg | Freq: Once | INTRAVENOUS | Status: AC
  Administered 2020-03-23: 5 mg via INTRAVENOUS

## 2020-03-23 MED ORDER — ONDANSETRON HCL 4 MG/2ML IJ SOLN
INTRAMUSCULAR | Status: DC | PRN
  Administered 2020-03-23: 4 mg via INTRAVENOUS

## 2020-03-23 MED ORDER — FENTANYL CITRATE (PF) 100 MCG/2ML IJ SOLN
INTRAMUSCULAR | Status: AC
  Filled 2020-03-23: qty 2

## 2020-03-23 MED ORDER — ROCURONIUM BROMIDE 10 MG/ML (PF) SYRINGE
PREFILLED_SYRINGE | INTRAVENOUS | Status: DC | PRN
  Administered 2020-03-23: 100 mg via INTRAVENOUS
  Administered 2020-03-23: 10 mg via INTRAVENOUS

## 2020-03-23 MED ORDER — DEXAMETHASONE SODIUM PHOSPHATE 10 MG/ML IJ SOLN
INTRAMUSCULAR | Status: DC | PRN
  Administered 2020-03-23: 8 mg via INTRAVENOUS

## 2020-03-23 SURGICAL SUPPLY — 55 items
BAG URINE DRAIN 2000ML AR STRL (UROLOGICAL SUPPLIES) ×2 IMPLANT
BASKET STONE NCOMPASS (UROLOGICAL SUPPLIES) IMPLANT
BASKET ZERO TIP NITINOL 2.4FR (BASKET) ×2 IMPLANT
BENZOIN TINCTURE PRP APPL 2/3 (GAUZE/BANDAGES/DRESSINGS) ×2 IMPLANT
BLADE SURG 15 STRL LF DISP TIS (BLADE) ×1 IMPLANT
BLADE SURG 15 STRL SS (BLADE) ×1
CATH AINSWORTH 30CC 24FR (CATHETERS) IMPLANT
CATH IMAGER II 65CM (CATHETERS) ×2 IMPLANT
CATH URET 5FR 28IN OPEN ENDED (CATHETERS) ×2 IMPLANT
CATH URET DUAL LUMEN 6-10FR 50 (CATHETERS) ×2 IMPLANT
CATH X-FORCE N30 NEPHROSTOMY (TUBING) ×2 IMPLANT
CHLORAPREP W/TINT 26 (MISCELLANEOUS) ×2 IMPLANT
COVER WAND RF STERILE (DRAPES) IMPLANT
DRAPE C-ARM 42X120 X-RAY (DRAPES) ×2 IMPLANT
DRAPE LINGEMAN PERC (DRAPES) ×2 IMPLANT
DRSG PAD ABDOMINAL 8X10 ST (GAUZE/BANDAGES/DRESSINGS) ×4 IMPLANT
DRSG TEGADERM 4X4.75 (GAUZE/BANDAGES/DRESSINGS) ×2 IMPLANT
DRSG TEGADERM 8X12 (GAUZE/BANDAGES/DRESSINGS) ×4 IMPLANT
EXTRACTOR STONE 1.7FRX115CM (UROLOGICAL SUPPLIES) IMPLANT
FIBER LASER FLEXIVA 365 (UROLOGICAL SUPPLIES) IMPLANT
FIBER LASER TRAC TIP (UROLOGICAL SUPPLIES) IMPLANT
GAUZE SPONGE 4X4 12PLY STRL (GAUZE/BANDAGES/DRESSINGS) ×2 IMPLANT
GLOVE BIOGEL M STRL SZ7.5 (GLOVE) ×2 IMPLANT
GOWN STRL REUS W/TWL XL LVL3 (GOWN DISPOSABLE) ×2 IMPLANT
GUIDEWIRE AMPLAZ .035X145 (WIRE) ×4 IMPLANT
GUIDEWIRE ANG ZIPWIRE 038X150 (WIRE) IMPLANT
GUIDEWIRE STR DUAL SENSOR (WIRE) ×4 IMPLANT
HOLDER NEEDLE AMPLATZ W/INSERT (MISCELLANEOUS) IMPLANT
IV SET EXTENSION CATH 6 NF (IV SETS) ×2 IMPLANT
KIT BALLIN UROMAX 15FX10 (LABEL) ×1 IMPLANT
KIT BASIN (CUSTOM PROCEDURE TRAY) ×2 IMPLANT
KIT PROBE 340X3.4XDISP GRN (MISCELLANEOUS) IMPLANT
KIT PROBE TRILOGY 3.4X340 (MISCELLANEOUS)
KIT PROBE TRILOGY 3.9X350 (MISCELLANEOUS) ×2 IMPLANT
KIT TURNOVER KIT A (KITS) IMPLANT
MANIFOLD NEPTUNE II (INSTRUMENTS) ×2 IMPLANT
NEEDLE SPNL 20GX3.5 QUINCKE YW (NEEDLE) IMPLANT
NEEDLE TROCAR 18X15 ECHO (NEEDLE) ×2 IMPLANT
NEEDLE TROCAR 18X20 (NEEDLE) IMPLANT
NS IRRIG 1000ML POUR BTL (IV SOLUTION) ×2 IMPLANT
SET HIGH PRES BAL DIL (LABEL) ×1
SHEATH PEELAWAY SET 9 (SHEATH) IMPLANT
SHEATH URETERAL 12FRX35CM (MISCELLANEOUS) ×2 IMPLANT
SPONGE LAP 4X18 RFD (DISPOSABLE) ×2 IMPLANT
STENT URET 6FRX24 CONTOUR (STENTS) ×2 IMPLANT
SUT ETHILON 3 0 PS 1 (SUTURE) ×2 IMPLANT
SYR 10ML LL (SYRINGE) ×2 IMPLANT
SYR 20ML LL LF (SYRINGE) ×4 IMPLANT
SYR 50ML LL SCALE MARK (SYRINGE) ×2 IMPLANT
TOWEL OR 17X26 10 PK STRL BLUE (TOWEL DISPOSABLE) ×2 IMPLANT
TRAY CYSTO PACK (CUSTOM PROCEDURE TRAY) ×2 IMPLANT
TRAY FOLEY MTR SLVR 16FR STAT (SET/KITS/TRAYS/PACK) ×2 IMPLANT
TUBING CONNECTING 10 (TUBING) ×4 IMPLANT
TUBING STONE CATCHER TRILOGY (MISCELLANEOUS) ×2 IMPLANT
TUBING UROLOGY SET (TUBING) ×2 IMPLANT

## 2020-03-23 NOTE — Anesthesia Postprocedure Evaluation (Signed)
Anesthesia Post Note  Patient: Samantha Jenkins  Procedure(s) Performed: LEFT NEPHROLITHOTOMY PERCUTANEOUS WITH SURGEON ACCESS (Left )     Patient location during evaluation: PACU Anesthesia Type: General Level of consciousness: awake Pain management: pain level controlled Vital Signs Assessment: post-procedure vital signs reviewed and stable Respiratory status: spontaneous breathing Cardiovascular status: stable Postop Assessment: no apparent nausea or vomiting Anesthetic complications: no    Last Vitals:  Vitals:   03/23/20 1500 03/23/20 1520  BP:    Pulse: 74 (!) 36  Resp: 14 14  Temp: 37 C   SpO2: 95% 97%    Last Pain:  Vitals:   03/23/20 1500  TempSrc:   PainSc: 0-No pain   Pain Goal: Patients Stated Pain Goal: 4 (03/23/20 0647)                 [name]

## 2020-03-23 NOTE — Op Note (Signed)
Pre-operative diagnosis: left renal pelvis stones, 2.50cm Post-operative diagnosis: as above   Procedure performed: cystoscopy, left retrograde pyelogram with interpretation, left ureteral dilation, left ureteroscopy, left percutaneous renal access, left nephrolithotomy, left nephrostogram,  Left ureteral stent placememt   Surgeon: Dr. [name]  Assistant: Sharlot Gowda, MD [facility] Resident)  Anesthesia: General  Complications: None  Specimens: The majority of the stones were removed and will be sent to the [facility] for further analysis.  Findings: 1. Stones encountered in the lower pole predominately, there were stones in all calyces 2. Percutaneous access was performed with ureteroscopic guidance. 3. Minimal bleeding at the end, no nephrostomy tube was left.   EBL: Approximately 250 cc  Specimens: stone from collecting system - taken to [facility] lab  Indication: Samantha Jenkins is a 79 y.o. patient with a history of nephrolithiasis with a large stone burden in the left kidney. After reviewing the management options for treatment, he elected to proceed with the above surgical procedure(s). We have discussed the potential benefits and risks of the procedure, side effects of the proposed treatment, the likelihood of the patient achieving the goals of the procedure, and any potential problems that might occur during the procedure or recuperation. Informed consent has been obtained.   Description:  Consent was obtained in the preoperative holding area. The patient was marked appropriately and then taken back to the operating room where he was intubated on the gurney. The patient was flipped prone onto the split leg OR table. Large jelly rolls were placed in the anterior axillary line on both sides allowing the patient's chest and abdomen to fall inbetween. The patient was then prepped and draped in the routine sterile fashion in the left flank and genital  area. A timeout was then held confirming the proper side and procedure as well as antibiotics were administered.  I then used the flexible cystoscope and passed gently into the patient's urethra under visual guidance. Once into the bladder I was able to cannulate the patient's left ureter with the sensor wire and advanced it up into the left renal pelvis.  I then used a dual-lumen catheter advanced it over the wire and into the distal ureter.  I was unable to advance it beyond this area, but did perform a retrograde pyelogram.  This demonstrated normal caliber ureter with no significant abnormalities of the ureter.  There was a filling defect in the lower pole and renal pelvis consistent with the patient's known stones.  At this point I then attempted to use the inner portion of the 12/14 [place] ureteral access sheath and dilate the ureter over the wire, unfortunately I was unable to get the dilator beyond the UVJ.  I then used a 10 cm x 15 French ureteral balloon dilator and dilated the ureter in 2 separate locations in the distal and proximal ureters.  Once the balloon dilator successfully dilated the ureter I was able to advance the ureteral access sheath up into the proximal renal pelvis.  I then advanced the flexible ureteroscope through the access sheath and into the renal pelvis.  We then identified a posterior lower pole calyx as the appropriate access point.  Using the C-arm rotated at 27 and the bulls-eye technique with an 18-gauge coaxial needle the lower posterior lateral calyx was targeted. Then rotating the C-arm AP depth of our needle was noted to be within the calyx and the inner part of the coaxial needle was removed. Urine was noted to  return.  Ultimately, I was able to visualize the needle into the calyx ensuring its appropriate in proper location.  I then pulled the ureteroscope back to the UPJ and switched to the nephroscope.  A 0.038 sensor wire was then passed through the sheath of the  coaxial needle and into the left renal collecting system. The wire was then passed down the ureter and into the ureteral access sheath and was able to get this all the way through the access sheath and through the urethra.  A dual-lumen catheter was then advanced over the wire and a second Super Stiff wire placed into the patient's bladder under fluoroscopic guidance, establishing 2 superstiff wires through the targeted calyx and into the bladder.   The 58 French NephroMax balloon was then passed over one of the Super Stiff wires and the tip guided down into the targeted calyx. The balloon was then inflated to approximately 12 atm, and once there was no waist noted under fluoroscopy the access sheath was advanced over the balloon. The balloon was then removed. The wires were then placed back into the sheaths and snapped to the drape.   Using the rigid nephroscope to explore the targeted calyx and kidney.  The stones were quickly encountered in the renal pelvis.  Then using a flexible cystoscope to navigate the remaining calyces of the kidney multiple smaller stone fragments encountered.  I then used the ureteroscope in a retrograde fashion and identified additional small stones which then were removed with the basket.  Using the 0 tip basket these fragments were grabbed and removed. Contrast was injected through the cystoscope and the calyces systematically inspected under fluoroscopic guidance to ensure that all stone fragments had been removed.   Then using the flexible ureteroscope the ureter was navigated in antegrade fashion. All stone fragments were pushed from the ureter into the bladder. Once the ureter was clear the scope was advanced into the bladder and a 0.038 sensor wire was left in the bladder and the scope backed out over wire. The sensor wire was then backloaded over the rigid nephroscope using the stent pusher and a 24 cm x 6 French double-J ureteral stent was passed antegrade over the sensor  wire down into the bladder under fluoroscopic guidance. Once the stent was in the bladder the wire was gently pulled back and a nice curl noted in the bladder. The wire completely removed from the stent, and nice curl on the proximal end of the stent was noted in the renal pelvis. The sheath was then backed out slowly to ensure that all calyces had been inspected and there was nothing behind the sheath.   A 79F ainsworth tip catheter was then passed over one of the Super Stiff wires through the sheath and into the renal pelvis. The sheath was then backed out of the kidney and cut over the red rubber catheter. A nephrostogram was then performed confirming the position of our nephrostomy tube and reassuring that there were no longer any filling defects from the patient's symptoms. After several minutes of direct pressure and observation was noted that there was no significant bleeding from the nephrostomy tube or around the nephrostomy tube tract. As such, I remove the nephrostomy tube as well as the safety wire. 25 cc of local anesthesia was then injected into the patient's wound, and the wound was closed with 3-0 nylon in 2 vertical mattress sutures. The incision was then padded using a bundle of 4 x 4's and Hypafix tape. Patient  was subsequently rolled over to the supine position and extubated. The patient was returned to the PACU in excellent condition. At the end of the case all lap and needle and sponges were accounted for. There are no perioperative complications.

## 2020-03-23 NOTE — Anesthesia Preprocedure Evaluation (Addendum)
Anesthesia Evaluation  Patient identified by MRN, date of birth, ID band Patient awake    Reviewed: Allergy & Precautions, NPO status , Patient's Chart, lab work & pertinent test results, reviewed documented beta blocker date and time   Airway Mallampati: II  TM Distance: >3 FB Neck ROM: Full    Dental  (+) Chipped, Dental Advisory Given,    Pulmonary asthma , former smoker,    Pulmonary exam normal breath sounds clear to auscultation       Cardiovascular hypertension, Pt. on medications and Pt. on home beta blockers Normal cardiovascular exam Rhythm:Regular Rate:Normal  HLD  EKG: LBBB   Neuro/Psych PSYCHIATRIC DISORDERS Depression negative neurological ROS     GI/Hepatic negative GI ROS, Neg liver ROS,   Endo/Other  negative endocrine ROS  Renal/GU negative Renal ROS  negative genitourinary   Musculoskeletal  (+) Arthritis ,   Abdominal   Peds  Hematology negative hematology ROS (+)   Anesthesia Other Findings   Reproductive/Obstetrics                            Anesthesia Physical Anesthesia Plan  ASA: III  Anesthesia Plan: General   Post-op Pain Management:    Induction: Intravenous  PONV Risk Score and Plan: 3 and Dexamethasone, Ondansetron and Treatment may vary due to age or medical condition  Airway Management Planned: Oral ETT  Additional Equipment:   Intra-op Plan:   Post-operative Plan: Extubation in OR  Informed Consent: I have reviewed the patients History and Physical, chart, labs and discussed the procedure including the risks, benefits and alternatives for the proposed anesthesia with the patient or authorized representative who has indicated his/her understanding and acceptance.     Dental advisory given  Plan Discussed with: CRNA  Anesthesia Plan Comments:         Anesthesia Quick Evaluation

## 2020-03-23 NOTE — Anesthesia Procedure Notes (Signed)
Procedure Name: Intubation Date/Time: 03/23/2020 8:05 AM Performed by: Montel Clock, CRNA Pre-anesthesia Checklist: Patient identified, Emergency Drugs available, Suction available, Patient being monitored and Timeout performed Patient Re-evaluated:Patient Re-evaluated prior to induction Oxygen Delivery Method: Circle system utilized Preoxygenation: Pre-oxygenation with 100% oxygen Induction Type: IV induction Ventilation: Mask ventilation without difficulty Laryngoscope Size: Mac and 3 Grade View: Grade II Tube type: Oral Tube size: 7.0 mm Number of attempts: 1 Airway Equipment and Method: Stylet Placement Confirmation: ETT inserted through vocal cords under direct vision,  positive ETCO2 and breath sounds checked- equal and bilateral Secured at: 21 cm Tube secured with: Tape Dental Injury: Teeth and Oropharynx as per pre-operative assessment

## 2020-03-23 NOTE — H&P (Signed)
The patient presents today for follow-up with microscopic hematuria. At our last office visit we obtain a CT scan. 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 There was no hydronephrosis or additional right-sided obstructing stones. She did have a large gallstone.   Interval: The patient denies any pain. She denies any gross hematuria. She denies any dysuria. She is otherwise doing quite well.   The patient is reasonably healthy with no significant comorbidities beyond obesity and mild hypertension.     ALLERGIES: SULFA - "blood in the urine"    MEDICATIONS: Acetaminophen  Acetaminophen Pm  Bisoprolol-Hydrochlorothiazide 5 mg-6.25 mg tablet  Pravastatin Sodium 40 mg tablet  Sertraline Hcl 100 mg tablet  Tylenol Pm Extra Strength  Vitamin D3     GU PSH: Hysterectomy, 1981 Locm 300-399Mg /Ml Iodine,1Ml - 11/03/2019       PSH Notes: Mohs surgery (back)   NON-GU PSH: Bilateral Tubal Ligation, 1976 Tonsillectomy.., 1945     GU PMH: Microscopic hematuria - 10/26/2019    NON-GU PMH: Asthma Depression Hypercholesterolemia Hypertension Skin Cancer, History    FAMILY HISTORY: 2 sons - Son Heart Attack - Father liver cancer - Mother stroke - Father    Notes: Mother deceased at age 97; liver cancer  Father deceased at age 71; heart attack   SOCIAL HISTORY: Marital Status: Divorced Preferred Language: English; Ethnicity: Not Hispanic Or Latino; Race: White Current Smoking Status: Patient does not smoke anymore. Has not smoked since 10/18/1969. Smoked for 4 years. Smoked 1/2 pack per day.   Tobacco Use Assessment Completed: Used Tobacco in last 30 days? Does drink.  Drinks 1 caffeinated drink per day. Patient's occupation Brewing technologist.    REVIEW OF SYSTEMS:    GU Review Female:   Patient denies frequent urination, hard to postpone urination, burning /pain with urination, get up at night to urinate, leakage of urine, stream starts and stops, trouble  starting your stream, have to strain to urinate, and being pregnant.  Gastrointestinal (Upper):   Patient denies nausea, vomiting, and indigestion/ heartburn.  Gastrointestinal (Lower):   Patient denies diarrhea and constipation.  Constitutional:   Patient denies fever, night sweats, weight loss, and fatigue.  Skin:   Patient denies skin rash/ lesion and itching.  Eyes:   Patient denies blurred vision and double vision.  Ears/ Nose/ Throat:   Patient denies sore throat and sinus problems.  Hematologic/Lymphatic:   Patient denies swollen glands and easy bruising.  Cardiovascular:   Patient denies chest pains and leg swelling.  Respiratory:   Patient denies cough and shortness of breath.  Endocrine:   Patient denies excessive thirst.  Musculoskeletal:   Patient denies back pain and joint pain.  Neurological:   Patient denies headaches and dizziness.  Psychologic:   Patient denies depression and anxiety.   VITAL SIGNS:      11/16/2019 01:56 PM  BP 149/81 mmHg  Pulse 56 /min  Temperature 97.5 F / 36.3 C   PAST DATA REVIEWED:  Source Of History:  Patient  Records Review:   Previous Doctor Records, Previous Patient Records, POC Tool  X-Ray Review: C.T. Abdomen/Pelvis: Reviewed Films. Discussed With Patient.     PROCEDURES: None   ASSESSMENT:      ICD-10 Details  1 GU:   Microscopic hematuria - R31.21   2   Renal calculus - N20.0      PLAN:           Schedule Return Visit/Planned Activity: 3 Months - Office Visit  Document Letter(s):  Created for Patient: Clinical Summary         Notes:   The patient's microscopic hematuria is almost certainly from the stone in her left kidney. The stone encompass is almost the entire lower pole calyx extending out into the renal pelvic region. I recommended treatment for this as this is something that will likely continue to grow and become problematic in various ways moving forward. I recommended PCNL given the stone size. I did  however recommend that we hold off on any treatment at this time given the current Covid environment. Fortunately, the patient is relatively asymptomatic with no pain or serious symptoms related to her stones. Our plan is to have the patient return to see me in approximately 3 months. At that time we can is plan her surgery assuming she has been vaccine aided and the hospitals have more capacity. I did discussed return precautions with the patient.

## 2020-03-23 NOTE — Transfer of Care (Signed)
Immediate Anesthesia Transfer of Care Note  Patient: Samantha Jenkins  Procedure(s) Performed: LEFT NEPHROLITHOTOMY PERCUTANEOUS WITH SURGEON ACCESS (Left )  Patient Location: PACU  Anesthesia Type:General  Level of Consciousness: awake, alert  and oriented  Airway & Oxygen Therapy: Patient Spontanous Breathing and Patient connected to face mask oxygen  Post-op Assessment: Report given to RN and Post -op Vital signs reviewed and stable  Post vital signs: Reviewed and stable  Last Vitals:  Vitals Value Taken Time  BP    Temp    Pulse 78 03/23/20 1157  Resp 9 03/23/20 1157  SpO2 100 % 03/23/20 1157  Vitals shown include unvalidated device data.  Last Pain:  Vitals:   03/23/20 0647  TempSrc:   PainSc: 3       Patients Stated Pain Goal: 4 ([id number])  Complications: No apparent anesthesia complications

## 2020-03-23 NOTE — Interval H&P Note (Signed)
History and Physical Interval Note:  03/23/2020 7:20 AM  Samantha Jenkins  has presented today for surgery, with the diagnosis of LEFT PARTIAL STAGHORN STONE.  The various methods of treatment have been discussed with the patient and family. After consideration of risks, benefits and other options for treatment, the patient has consented to  Procedure(s): LEFT NEPHROLITHOTOMY PERCUTANEOUS WITH SURGEON ACCESS (Left) as a surgical intervention.  The patient's history has been reviewed, patient examined, no change in status, stable for surgery.  I have reviewed the patient's chart and labs.  Questions were answered to the patient's satisfaction.     [name]

## 2020-03-23 NOTE — Discharge Instructions (Signed)
Discharge instructions following PCNL  Call your doctor for: Fevers greater than 100.5 Severe nausea or vomiting Increasing pain not controlled by pain medication Increasing redness or drainage from incisions Decreased urine output or a catheter is no longer draining  The number for questions is [phone].  Activity: Gradually increase activity with short frequent walks, 3-4 times a day.  Avoid strenuous activities, like sports, lawn-mowing, or heavy lifting (more than 10-15 pounds).  Wear loose, comfortable clothing that pull or kink the tube or tubes.  Do not drive while taking pain medication, or until your doctor permitts it.  Bathing and dressing changes: You should not shower for 48 hours after surgery.  Do not soak your back in a bathtub.  Diet: It is extremely important to drink plenty of fluids after surgery, especially water.  You may resume your regular diet, unless otherwise instructed.  Medications: May take Tylenol (acetaminophen) or ibuprofen (Advil, Motrin) as directed over-the-counter. Take any prescriptions as directed.  Follow-up appointments: Follow-up appointment will be scheduled with Dr. Yolande Skoda in 10-14 days for hospital check and stent removal.  

## 2020-03-24 DIAGNOSIS — N200 Calculus of kidney: Secondary | ICD-10-CM | POA: Diagnosis not present

## 2020-03-24 LAB — BASIC METABOLIC PANEL
Anion gap: 7 (ref 5–15)
BUN: 12 mg/dL (ref 8–23)
CO2: 23 mmol/L (ref 22–32)
Calcium: 8.1 mg/dL — ABNORMAL LOW (ref 8.9–10.3)
Chloride: 109 mmol/L (ref 98–111)
Creatinine, Ser: 0.92 mg/dL (ref 0.44–1.00)
GFR calc Af Amer: >60 mL/min (ref >60)
GFR calc non Af Amer: 59 mL/min — ABNORMAL LOW (ref >60)
Glucose, Bld: 128 mg/dL — ABNORMAL HIGH (ref 70–99)
Potassium: 3.9 mmol/L (ref 3.5–5.1)
Sodium: 139 mmol/L (ref 135–145)

## 2020-03-24 LAB — CBC
HCT: 33.8 % — ABNORMAL LOW (ref 36.0–46.0)
Hemoglobin: 11 g/dL — ABNORMAL LOW (ref 12.0–15.0)
MCH: 33.3 pg (ref 26.0–34.0)
MCHC: 32.5 g/dL (ref 30.0–36.0)
MCV: 102.4 fL — ABNORMAL HIGH (ref 80.0–100.0)
Platelets: 191 10*3/uL (ref 150–400)
RBC: 3.3 MIL/uL — ABNORMAL LOW (ref 3.87–5.11)
RDW: 13.8 % (ref 11.5–15.5)
WBC: 11.1 10*3/uL — ABNORMAL HIGH (ref 4.0–10.5)
nRBC: 0 % (ref 0.0–0.2)

## 2020-03-24 MED ORDER — TRAMADOL HCL 50 MG PO TABS: 50.0000 mg | ORAL_TABLET | Freq: Four times a day (QID) | ORAL | 0 refills | Status: DC | PRN

## 2020-03-24 MED ORDER — KETOROLAC TROMETHAMINE 15 MG/ML IJ SOLN
15.0000 mg | Freq: Once | INTRAMUSCULAR | Status: AC
  Administered 2020-03-24: 15 mg via INTRAVENOUS
  Filled 2020-03-24: qty 1

## 2020-03-24 NOTE — Progress Notes (Signed)
Urology Progress Note   1 Day Post-Op from left percutaneous and concurrent retrograde nephrostolithotomy, with placement of ureteral stent  Subjective: PACU hemoglobin 12.7  NAEON.  Does have some mild paresthesias to the left thumb and index finger, these have improved overnight.  Likely related to positioning Pain is well controlled Afebrile and hemodynamically stable No nausea or emesis Excellent urine output since the operating room Hemoglobin 11.0 this morning Creatinine 0.9  She feels great and would like to go home.  Objective: Vital signs in last 24 hours: Temp:  [97.4 F (36.3 C)-99.1 F (37.3 C)] 98.5 F (36.9 C) (05/07 0609) Pulse Rate:  [36-78] 68 (05/07 0609) Resp:  [9-18] 18 (05/06 2209) BP: (109-189)/(47-97) 109/49 (05/07 0609) SpO2:  [90 %-100 %] 92 % (05/07 0609) Weight:  [92.2 kg] 92.2 kg (05/06 0647)  Intake/Output from previous day: 05/06 0701 - 05/07 0700 In: 2400 [I.V.:2100; IV Piggyback:300] Out: 2330 [Urine:2080; Blood:250] Intake/Output this shift: Total I/O In: -  Out: 1655 [Urine:1655]  Physical Exam:  General: Alert and oriented CV: Regular rate Lungs: No increased work of breathing Abdomen:  Soft, appropriately tender.  Left flank incision well dressed with dry bandage and Tegaderm GU: Foley in place draining clear pink urine  Ext: NT, No erythema  Lab Results: Recent Labs    03/23/20 1824 03/24/20 0439  HGB 12.7 11.0*  HCT 37.5 33.8*   Recent Labs    03/24/20 0439  NA 139  K 3.9  CL 109  CO2 23  GLUCOSE 128*  BUN 12  CREATININE 0.92  CALCIUM 8.1*    Studies/Results: DG C-Arm 1-60 Min-No Report  Result Date: 03/23/2020 Fluoroscopy was utilized by the requesting physician.  No radiographic interpretation.    Assessment/Plan:  79 y.o. female s/p left percutaneous nephrostolithotomy, concurrent retrograde access given degree of stone burden and anatomy, left ureteral stent left in place without string.  Overall  doing well post-op.   Continue multimodal analgesia Regular diet Medlock Trial of void today Continue JJ ureteral stent Bowel regimen As needed antiemetic Out of bed, incentive spirometry  Anticipate discharge later today pending clinical progression   LOS: 0 days

## 2020-03-24 NOTE — Discharge Summary (Signed)
Date of admission: 03/23/2020  Date of discharge: 03/24/2020  Admission diagnosis: left nephrolithiasis  Discharge diagnosis: same  Secondary diagnoses:  Patient Active Problem List   Diagnosis Date Noted  . Nephrolithiasis 03/23/2020  . Pain in joint of right shoulder 12/24/2017  . Arthritis of left knee 12/04/2017  . Osteoarthritis of knee 12/03/2017  . Pain in right knee 12/03/2017  . Arthritis 07/11/2015  . LBBB (left bundle branch block) 04/08/2013  . Metabolic syndrome 04/08/2013  . Hypertension 07/02/2011  . Hyperlipidemia LDL goal <100 07/02/2011  . Obesity (BMI 30-39.9) 07/02/2011  . Family history of heart disease in female family member before age 55 07/02/2011  . Dysthymia 07/02/2011    Procedures performed: Procedure(s): LEFT NEPHROLITHOTOMY PERCUTANEOUS WITH SURGEON ACCESS  History and Physical: For full details, please see admission history and physical. Briefly, Samantha Jenkins is a 79 y.o. year old patient with a large left kidney stone.   Hospital Course: Patient tolerated the procedure well.  She was then transferred to the floor after an uneventful PACU stay.  Her hospital course was uncomplicated.  On POD#1 she had met discharge criteria: was eating a regular diet, was up and ambulating independently,  pain was well controlled, was voiding without a catheter, and was ready to for discharge.   Laboratory values:  Recent Labs    03/23/20 1824 03/24/20 0439  WBC 9.8 11.1*  HGB 12.7 11.0*  HCT 37.5 33.8*   Recent Labs    03/24/20 0439  NA 139  K 3.9  CL 109  CO2 23  GLUCOSE 128*  BUN 12  CREATININE 0.92  CALCIUM 8.1*   No results for input(s): LABPT, INR in the last 72 hours. No results for input(s): LABURIN in the last 72 hours. Results for orders placed or performed during the hospital encounter of 03/20/20  SARS CORONAVIRUS 2 (TAT 6-24 HRS) Nasopharyngeal Nasopharyngeal Swab     Status: None   Collection Time: 03/20/20 11:07 AM   Specimen:  Nasopharyngeal Swab  Result Value Ref Range Status   SARS Coronavirus 2 NEGATIVE NEGATIVE Final    Comment: (NOTE) SARS-CoV-2 target nucleic acids are NOT DETECTED. The SARS-CoV-2 RNA is generally detectable in upper and lower respiratory specimens during the acute phase of infection. Negative results do not preclude SARS-CoV-2 infection, do not rule out co-infections with other pathogens, and should not be used as the sole basis for treatment or other patient management decisions. Negative results must be combined with clinical observations, patient history, and epidemiological information. The expected result is Negative. Fact Sheet for Patients: https://www.fda.gov/media/138098/download Fact Sheet for Healthcare Providers: https://www.fda.gov/media/138095/download This test is not yet approved or cleared by the [place] FDA and  has been authorized for detection and/or diagnosis of SARS-CoV-2 by FDA under an Emergency Use Authorization (EUA). This EUA will remain  in effect (meaning this test can be used) for the duration of the COVID-19 declaration under Section 56 4(b)(1) of the Act, 21 U.S.C. section 360bbb-3(b)(1), unless the authorization is terminated or revoked sooner. Performed at [facility], [address] [place], [place] [address]     Disposition: Home  Discharge instruction: The patient was instructed to be ambulatory but told to refrain from heavy lifting, strenuous activity, or driving.   Discharge medications:  Allergies as of 03/24/2020      Reactions   Statins    Muscle aches   Sulfa Antibiotics Other (See Comments)   Blood in urine      Medication   List    TAKE these medications   acetaminophen 650 MG CR tablet Commonly known as: TYLENOL Take 650 mg by mouth every 8 (eight) hours as needed for pain.   albuterol 108 (90 Base) MCG/ACT inhaler Commonly known as: VENTOLIN HFA Inhale 2 puffs into the lungs every 6 (six) hours as needed for  wheezing or shortness of breath.   bisoprolol-hydrochlorothiazide 5-6.25 MG tablet Commonly known as: ZIAC TAKE 1 TABLET BY MOUTH DAILY. What changed: when to take this   diphenhydramine-acetaminophen 25-500 MG Tabs tablet Commonly known as: TYLENOL PM Take 1-2 tablets by mouth at bedtime as needed.   meloxicam 15 MG tablet Commonly known as: MOBIC TAKE 1/2 TO 1 TABLET(7.5 TO 15 MG) BY MOUTH DAILY What changed: See the new instructions.   neomycin-polymyxin-hydrocortisone OTIC solution Commonly known as: CORTISPORIN Apply 1-2 drops to toe after soaking twice a day   pravastatin 40 MG tablet Commonly known as: PRAVACHOL TAKE 1 TABLET EVERY DAY What changed: when to take this   sertraline 100 MG tablet Commonly known as: ZOLOFT TAKE 1 TABLET EVERY DAY What changed: when to take this   traMADol 50 MG tablet Commonly known as: ULTRAM Take 1-2 tablets (50-100 mg total) by mouth every 6 (six) hours as needed for moderate pain.   Vitamin D 50 MCG (2000 UT) Caps Take 2,000 Units by mouth daily.       Followup:  Follow-up Information    Kamorie Aldous W, MD On 04/06/2020.   Specialty: Urology Why: 2pm Contact information: [address] [place] [address] [phone]           

## 2020-03-24 NOTE — Plan of Care (Signed)
Pt did not require any PRN pain meds throughout the night. Her Q 6 1000 mg IV Tylenol was sufficient.

## 2020-03-24 NOTE — Op Note (Deleted)
Date of admission: 03/23/2020  Date of discharge: 03/24/2020  Admission diagnosis: left nephrolithiasis  Discharge diagnosis: same  Secondary diagnoses:  Patient Active Problem List   Diagnosis Date Noted  . Nephrolithiasis 03/23/2020  . Pain in joint of right shoulder 12/24/2017  . Arthritis of left knee 12/04/2017  . Osteoarthritis of knee 12/03/2017  . Pain in right knee 12/03/2017  . Arthritis 07/11/2015  . LBBB (left bundle branch block) 04/08/2013  . Metabolic syndrome 75/64/3329  . Hypertension 07/02/2011  . Hyperlipidemia LDL goal <100 07/02/2011  . Obesity (BMI 30-39.9) 07/02/2011  . Family history of heart disease in female family member before age 52 07/02/2011  . Dysthymia 07/02/2011    Procedures performed: Procedure(s): LEFT NEPHROLITHOTOMY PERCUTANEOUS WITH SURGEON ACCESS  History and Physical: For full details, please see admission history and physical. Briefly, Samantha Jenkins is a 79 y.o. year old patient with a large left kidney stone.   Hospital Course: Patient tolerated the procedure well.  She was then transferred to the floor after an uneventful PACU stay.  Her hospital course was uncomplicated.  On POD#1 she had met discharge criteria: was eating a regular diet, was up and ambulating independently,  pain was well controlled, was voiding without a catheter, and was ready to for discharge.   Laboratory values:  Recent Labs    03/23/20 1824 03/24/20 0439  WBC 9.8 11.1*  HGB 12.7 11.0*  HCT 37.5 33.8*   Recent Labs    03/24/20 0439  NA 139  K 3.9  CL 109  CO2 23  GLUCOSE 128*  BUN 12  CREATININE 0.92  CALCIUM 8.1*   No results for input(s): LABPT, INR in the last 72 hours. No results for input(s): LABURIN in the last 72 hours. Results for orders placed or performed during the hospital encounter of 03/20/20  SARS CORONAVIRUS 2 (TAT 6-24 HRS) Nasopharyngeal Nasopharyngeal Swab     Status: None   Collection Time: 03/20/20 11:07 AM   Specimen:  Nasopharyngeal Swab  Result Value Ref Range Status   SARS Coronavirus 2 NEGATIVE NEGATIVE Final    Comment: (NOTE) SARS-CoV-2 target nucleic acids are NOT DETECTED. The SARS-CoV-2 RNA is generally detectable in upper and lower respiratory specimens during the acute phase of infection. Negative results do not preclude SARS-CoV-2 infection, do not rule out co-infections with other pathogens, and should not be used as the sole basis for treatment or other patient management decisions. Negative results must be combined with clinical observations, patient history, and epidemiological information. The expected result is Negative. Fact Sheet for Patients: SugarRoll.be Fact Sheet for Healthcare Providers: https://www.woods-mathews.com/ This test is not yet approved or cleared by the [place] FDA and  has been authorized for detection and/or diagnosis of SARS-CoV-2 by FDA under an Emergency Use Authorization (EUA). This EUA will remain  in effect (meaning this test can be used) for the duration of the COVID-19 declaration under Section 56 4(b)(1) of the Act, 21 U.S.C. section 360bbb-3(b)(1), unless the authorization is terminated or revoked sooner. Performed at [facility], [place] [address] [place], [place] [address]     Disposition: Home  Discharge instruction: The patient was instructed to be ambulatory but told to refrain from heavy lifting, strenuous activity, or driving.   Discharge medications:  Allergies as of 03/24/2020      Reactions   Statins    Muscle aches   Sulfa Antibiotics Other (See Comments)   Blood in urine      Medication  List    TAKE these medications   acetaminophen 650 MG CR tablet Commonly known as: TYLENOL Take 650 mg by mouth every 8 (eight) hours as needed for pain.   albuterol 108 (90 Base) MCG/ACT inhaler Commonly known as: VENTOLIN HFA Inhale 2 puffs into the lungs every 6 (six) hours as needed for  wheezing or shortness of breath.   bisoprolol-hydrochlorothiazide 5-6.25 MG tablet Commonly known as: ZIAC TAKE 1 TABLET BY MOUTH DAILY. What changed: when to take this   diphenhydramine-acetaminophen 25-500 MG Tabs tablet Commonly known as: TYLENOL PM Take 1-2 tablets by mouth at bedtime as needed.   meloxicam 15 MG tablet Commonly known as: MOBIC TAKE 1/2 TO 1 TABLET(7.5 TO 15 MG) BY MOUTH DAILY What changed: See the new instructions.   neomycin-polymyxin-hydrocortisone OTIC solution Commonly known as: CORTISPORIN Apply 1-2 drops to toe after soaking twice a day   pravastatin 40 MG tablet Commonly known as: PRAVACHOL TAKE 1 TABLET EVERY DAY What changed: when to take this   sertraline 100 MG tablet Commonly known as: ZOLOFT TAKE 1 TABLET EVERY DAY What changed: when to take this   traMADol 50 MG tablet Commonly known as: ULTRAM Take 1-2 tablets (50-100 mg total) by mouth every 6 (six) hours as needed for moderate pain.   Vitamin D 50 MCG (2000 UT) Caps Take 2,000 Units by mouth daily.       Followup:  Follow-up Information    [name], MD On 04/06/2020.   Specialty: Urology Why: [place] Ion information: [place] [address] [phone]

## 2020-03-24 NOTE — Progress Notes (Signed)
Patient discharged home.  IV removed - WNL.  Reviewed AVS and medications, when to call MD.  Encouraged to drink plenty of fluids and walk several times through out the day to prevent dehydration and blood clots.  Patient verbalizes understanding, aware of follow up appointment.  No questions at this time, assisted off unit in NAD via WC with NT

## 2020-03-27 DIAGNOSIS — N200 Calculus of kidney: Secondary | ICD-10-CM | POA: Diagnosis not present

## 2020-04-06 DIAGNOSIS — N200 Calculus of kidney: Secondary | ICD-10-CM | POA: Diagnosis not present

## 2020-05-23 DIAGNOSIS — N200 Calculus of kidney: Secondary | ICD-10-CM | POA: Diagnosis not present

## 2020-05-26 NOTE — H&P (Signed)
TOTAL KNEE ADMISSION H&P  Patient is being admitted for right total knee arthroplasty.  Subjective:  Chief Complaint:   Right knee primary OA / pain  HPI: Samantha Jenkins, 79 y.o. female, has a history of pain and functional disability in the right knee due to arthritis and has failed non-surgical conservative treatments for greater than 12 weeks to includeNSAID's and/or analgesics, corticosteriod injections and activity modification.  Onset of symptoms was gradual, starting 3 years ago with gradually worsening course since that time. The patient noted no past surgery on the right knee(s).  Patient currently rates pain in the right knee(s) at 8 out of 10 with activity. Patient has worsening of pain with activity and weight bearing, pain that interferes with activities of daily living, pain with passive range of motion, crepitus and joint swelling.  Patient has evidence of periarticular osteophytes and joint space narrowing by imaging studies. There is no active infection.  Risks, benefits and expectations were discussed with the patient.  Risks including but not limited to the risk of anesthesia, blood clots, nerve damage, blood vessel damage, failure of the prosthesis, infection and up to and including death.  Patient understand the risks, benefits and expectations and wishes to proceed with surgery.   PCP: Kelton Pillar, MD  D/C Plans:       Home   Post-op Meds:       No Rx given  Tranexamic Acid:      To be given - IV   Decadron:      Is to be given  FYI:      ASA  Norco  DME:    Rx sent for - RW & 3-n-1  PT:   OPPT    [facility]    Patient Active Problem List   Diagnosis Date Noted  . Nephrolithiasis 03/23/2020  . Pain in joint of right shoulder 12/24/2017  . Arthritis of left knee 12/04/2017  . Osteoarthritis of knee 12/03/2017  . Pain in right knee 12/03/2017  . Arthritis 07/11/2015  . LBBB (left bundle branch block) 04/08/2013  . Metabolic syndrome  10/62/6948  . Hypertension 07/02/2011  . Hyperlipidemia LDL goal <100 07/02/2011  . Obesity (BMI 30-39.9) 07/02/2011  . Family history of heart disease in female family member before age 78 07/02/2011  . Dysthymia 07/02/2011   Past Medical History:  Diagnosis Date  . Arthritis    knee  . Asthma   . BCE (basal cell epithelioma)   . Cataract   . Depression   . Dysthymia   . FHx: cardiovascular disease   . History of kidney stones   . Hyperlipidemia   . Hypertension   . LBBB (left bundle branch block)   . Obesity   . Pneumonia   . Sarcoid    pt. denies   . Vitamin D deficiency     Past Surgical History:  Procedure Laterality Date  . ABDOMINAL HYSTERECTOMY  1986  . CATARACT EXTRACTION Bilateral 2016  . EYE SURGERY     bil cataracts  . KNEE ARTHROSCOPY  1995   left  . NEPHROLITHOTOMY Left 03/23/2020   Procedure: LEFT NEPHROLITHOTOMY PERCUTANEOUS WITH SURGEON ACCESS;  Surgeon: [name], MD;  Location: [facility];  Service: Urology;  Laterality: Left;  . TONSILLECTOMY  1945  . TUBAL LIGATION  1979    No current facility-administered medications for this encounter.   Current Outpatient Medications  Medication Sig Dispense Refill Last Dose  . acetaminophen (TYLENOL) 650  MG CR tablet Take 650 mg by mouth every 8 (eight) hours as needed for pain.     Marland Kitchen albuterol (PROVENTIL HFA;VENTOLIN HFA) 108 (90 BASE) MCG/ACT inhaler Inhale 2 puffs into the lungs every 6 (six) hours as needed for wheezing or shortness of breath. 1 Inhaler 0   . bismuth subsalicylate (PEPTO BISMOL) 262 MG chewable tablet Chew 262 mg by mouth daily as needed for indigestion.     . bisoprolol-hydrochlorothiazide (ZIAC) 5-6.25 MG tablet TAKE 1 TABLET BY MOUTH DAILY. (Patient taking differently: Take 1 tablet by mouth at bedtime. ) 90 tablet 0   . Cholecalciferol (VITAMIN D) 2000 UNITS CAPS Take 2,000 Units by mouth daily.      . diphenhydramine-acetaminophen (TYLENOL PM) 25-500 MG TABS tablet Take 1 tablet by  mouth at bedtime as needed (sleep).      . meloxicam (MOBIC) 15 MG tablet TAKE 1/2 TO 1 TABLET(7.5 TO 15 MG) BY MOUTH DAILY (Patient taking differently: Take 15 mg by mouth daily. ) 90 tablet 1   . neomycin-bacitracin-polymyxin (NEOSPORIN) ointment Apply 1 application topically as needed for wound care.     Vladimir Faster Glycol-Propyl Glycol (SYSTANE OP) Place 1 drop into both eyes daily as needed (irritation).     . pravastatin (PRAVACHOL) 40 MG tablet TAKE 1 TABLET EVERY DAY (Patient taking differently: Take 40 mg by mouth every evening. ) 90 tablet 0   . sertraline (ZOLOFT) 100 MG tablet TAKE 1 TABLET EVERY DAY (Patient taking differently: Take 150 mg by mouth at bedtime. ) 90 tablet 0   . traMADol (ULTRAM) 50 MG tablet Take 1-2 tablets (50-100 mg total) by mouth every 6 (six) hours as needed for moderate pain. (Patient not taking: Reported on 05/17/2020) 15 tablet 0 Completed Course at Unknown time   Allergies  Allergen Reactions  . Statins     Muscle aches  . Sulfa Antibiotics Other (See Comments)    Blood in urine    Social History   Tobacco Use  . Smoking status: Former Smoker    Quit date: 07/18/1967    Years since quitting: 52.8  . Smokeless tobacco: Never Used  Substance Use Topics  . Alcohol use: Yes    Alcohol/week: 3.0 standard drinks    Types: 3 Glasses of wine per week    Comment: 1-2 glasses      Family History  Problem Relation Age of Onset  . Colon cancer Neg Hx   . Stomach cancer Neg Hx   . Rectal cancer Neg Hx      Review of Systems  Constitutional: Negative.   HENT: Negative.   Eyes: Negative.   Respiratory: Negative.   Cardiovascular: Negative.   Gastrointestinal: Negative.   Genitourinary: Negative.   Musculoskeletal: Positive for joint pain.  Skin: Negative.   Neurological: Negative.   Endo/Heme/Allergies: Negative.   Psychiatric/Behavioral: Negative.     Objective:  Physical Exam Constitutional:      Appearance: She is well-developed.  HENT:      Head: Normocephalic.  Eyes:     Pupils: Pupils are equal, round, and reactive to light.  Neck:     Thyroid: No thyromegaly.     Vascular: No JVD.     Trachea: No tracheal deviation.  Cardiovascular:     Rate and Rhythm: Normal rate and regular rhythm.  Pulmonary:     Effort: Pulmonary effort is normal. No respiratory distress.     Breath sounds: Normal breath sounds. No wheezing.  Abdominal:  Palpations: Abdomen is soft.     Tenderness: There is no abdominal tenderness. There is no guarding.  Musculoskeletal:     Cervical back: Neck supple.     Right knee: Swelling and bony tenderness present. No erythema or ecchymosis. Decreased range of motion. Tenderness present.  Lymphadenopathy:     Cervical: No cervical adenopathy.  Skin:    General: Skin is warm and dry.  Neurological:     Mental Status: She is alert and oriented to person, place, and time.      Labs:   Estimated body mass index is 34.35 kg/m as calculated from the following:   Height as of 03/23/20: 5' 4.5" (1.638 m).   Weight as of 03/23/20: 92.2 kg.   Imaging Review Plain radiographs demonstrate severe degenerative joint disease of the right knee. The bone quality appears to be good for age and reported activity level.      Assessment/Plan:  End stage arthritis, right knee   The patient history, physical examination, clinical judgment of the provider and imaging studies are consistent with end stage degenerative joint disease of the right knee and total knee arthroplasty is deemed medically necessary. The treatment options including medical management, injection therapy arthroscopy and arthroplasty were discussed at length. The risks and benefits of total knee arthroplasty were presented and reviewed. The risks due to aseptic loosening, infection, stiffness, patella tracking problems, thromboembolic complications and other imponderables were discussed. The patient acknowledged the explanation, agreed to  proceed with the plan and consent was signed. Patient is being admitted for inpatient treatment for surgery, pain control, PT, OT, prophylactic antibiotics, VTE prophylaxis, progressive ambulation and ADL's and discharge planning. The patient is planning to be discharged home.     Patient's anticipated LOS is less than 2 midnights, meeting these requirements: - Lives within 1 hour of care - Has a competent adult at home to recover with post-op recover - NO history of  - Chronic pain requiring opiods  - Diabetes  - Coronary Artery Disease  - Heart failure  - Heart attack  - Stroke  - DVT/VTE  - Cardiac arrhythmia  - Respiratory Failure/COPD  - Renal failure  - Anemia  - Advanced Liver disease    West Pugh. Kurk Corniel   PA-C  05/26/2020, 9:08 AM

## 2020-05-26 NOTE — Progress Notes (Addendum)
COVID Vaccine Completed: x2  Date COVID Vaccine completed: COVID vaccine manufacturer: Pfizer    [employer]   PCP - Dr. Kelton Pillar Cardiologist - Dr. Debara Pickett.  Pt states saw in the past, not currently seeing  No Back Stimulator  Chest x-ray -   EKG - 03-13-20 in Epic Stress Test -  ECHO -  Cardiac Cath -   Sleep Study -  CPAP -   Fasting Blood Sugar -  Checks Blood Sugar _____ times a day  Blood Thinner Instructions: Aspirin Instructions: Last Dose:  Anesthesia review:   Patient denies shortness of breath, fever, cough and chest pain at PAT appointment  No SOB with ADL's  Patient verbalized understanding of instructions that were given to them at the PAT appointment. Patient was also instructed that they will need to review over the PAT instructions again at home before surgery.

## 2020-05-26 NOTE — Patient Instructions (Addendum)
DUE TO COVID-19 ONLY ONE VISITOR ARE ALLOWED TO COME WITH YOU AND STAY IN THE WAITING ROOM ONLY DURING PRE OP AND PROCEDURE. THEN TWO VISITORS MAY VISIT WITH  YOU IN YOUR PRIVATE ROOM DURING VISITING HOURS ONLY!!   COVID SWAB TESTING MUST BE COMPLETED ON:     06-02-20 @ 3:00 PM [address] [place] -[facility] enter pre surgical testing line (Must self quarantine after testing. Follow instructions on handout.)             Your procedure is scheduled on: 06-06-20   Report to [facility] Main  Entrance    Report to admitting at 9:00 AM   Call this number if you have problems the morning of surgery [phone]   Do not eat food After Midnight.   May have liquids until 8:30 AM day of surgery.Complete one Ensure drink the morning of surgery at 8:30 AM the day of surgery. CLEAR LIQUID DIET  Foods Allowed                                                                     Foods Excluded  Water, Black Coffee and tea, regular and decaf              liquids that you cannot  Plain Jell-O in any flavor  (No red)                                     see through such as: Fruit ices (not with fruit pulp)                                     milk, soups, orange juice  Iced Popsicles (No red)                                     All solid food                                   Apple juices Sports drinks like Gatorade (No red) Lightly seasoned clear broth or consume(fat free) Sugar, honey syrup       Take these medicines the morning of surgery with A SIP OF WATER: None.  May use Albuterol (Proventil/Ventolin) inhaler as needed    Oral Hygiene is also important to reduce your risk of infection.                                     Remember - BRUSH YOUR TEETH THE MORNING OF SURGERY WITH YOUR REGULAR TOOTHPASTE. AFTER THAT, NO WATER,  CANDY, GUM, OR MINTS                              You may not have any metal on your body including hair pins, jewelry, and body  piercings               Do not wear make-up, lotions, powders, perfumes, or deodorant               Do not wear nail polish.  Do not shave  48 hours prior to surgery.                 Do not bring valuables to the hospital. [place].   Contacts, dentures or bridgework may not be worn into surgery.   Bring small overnight bag day of surgery.    Special Instructions: N/A              Please read over the following fact sheets you were given: IF YOU HAVE QUESTIONS ABOUT YOUR PRE OP INSTRUCTIONS PLEASE CALL [phone]    Before surgery, you can play an important role.  Because skin is not sterile, your skin needs to be as free of germs as possible.  You can reduce the number of germs on your skin by washing with CHG (chlorahexidine gluconate) soap before surgery.  CHG is an antiseptic cleaner which kills germs and bonds with the skin to continue killing germs even after washing. Please DO NOT use if you have an allergy to CHG or antibacterial soaps.  If your skin becomes reddened/irritated stop using the CHG and inform your nurse when you arrive at Short Stay. Do not shave (including legs and underarms) for at least 48 hours prior to the first CHG shower.  You may shave your face/neck.  Please follow these instructions carefully:  1.  Shower with CHG Soap the night before surgery and the  morning of surgery.  2.  If you choose to wash your hair, wash your hair first as usual with your normal  shampoo.  3.  After you shampoo, rinse your hair and body thoroughly to remove the shampoo.                             4.  Use CHG as you would any other liquid soap.  You can apply chg directly to the skin and wash.  Gently with a scrungie or clean washcloth.  5.  Apply the CHG Soap to your body ONLY FROM THE NECK DOWN.   Do   not use on face/ open                           Wound or open sores. Avoid contact with eyes, ears mouth and   genitals (private parts).                        Wash face,  Genitals (private parts) with your normal soap.             6.  Wash thoroughly, paying special attention to the area where your    surgery  will be performed.  7.  Thoroughly rinse your body with warm water from the neck down.  8.  DO NOT shower/wash with your normal soap after using and rinsing off the CHG Soap.                9.  Pat yourself dry with a clean towel.            10.  Wear clean pajamas.  11.  Place clean sheets on your bed the night of your first shower and do not  sleep with pets. Day of Surgery : Do not apply any lotions/deodorants the morning of surgery.  Please wear clean clothes to the hospital/surgery center.  FAILURE TO FOLLOW THESE INSTRUCTIONS MAY RESULT IN THE CANCELLATION OF YOUR SURGERY  PATIENT SIGNATURE_________________________________  NURSE SIGNATURE__________________________________  ________________________________________________________________________   Adam Phenix  An incentive spirometer is a tool that can help keep your lungs clear and active. This tool measures how well you are filling your lungs with each breath. Taking long deep breaths may help reverse or decrease the chance of developing breathing (pulmonary) problems (especially infection) following:  A long period of time when you are unable to move or be active. BEFORE THE PROCEDURE   If the spirometer includes an indicator to show your best effort, your nurse or respiratory therapist will set it to a desired goal.  If possible, sit up straight or lean slightly forward. Try not to slouch.  Hold the incentive spirometer in an upright position. INSTRUCTIONS FOR USE  1. Sit on the edge of your bed if possible, or sit up as far as you can in bed or on a chair. 2. Hold the incentive spirometer in an upright position. 3. Breathe out normally. 4. Place the mouthpiece in your mouth and seal your lips tightly around it. 5. Breathe in  slowly and as deeply as possible, raising the piston or the ball toward the top of the column. 6. Hold your breath for 3-5 seconds or for as long as possible. Allow the piston or ball to fall to the bottom of the column. 7. Remove the mouthpiece from your mouth and breathe out normally. 8. Rest for a few seconds and repeat Steps 1 through 7 at least 10 times every 1-2 hours when you are awake. Take your time and take a few normal breaths between deep breaths. 9. The spirometer may include an indicator to show your best effort. Use the indicator as a goal to work toward during each repetition. 10. After each set of 10 deep breaths, practice coughing to be sure your lungs are clear. If you have an incision (the cut made at the time of surgery), support your incision when coughing by placing a pillow or rolled up towels firmly against it. Once you are able to get out of bed, walk around indoors and cough well. You may stop using the incentive spirometer when instructed by your caregiver.  RISKS AND COMPLICATIONS  Take your time so you do not get dizzy or light-headed.  If you are in pain, you may need to take or ask for pain medication before doing incentive spirometry. It is harder to take a deep breath if you are having pain. AFTER USE  Rest and breathe slowly and easily.  It can be helpful to keep track of a log of your progress. Your caregiver can provide you with a simple table to help with this. If you are using the spirometer at home, follow these instructions: [place] IF:   You are having difficultly using the spirometer.  You have trouble using the spirometer as often as instructed.  Your pain medication is not giving enough relief while using the spirometer.  You develop fever of 100.5 F (38.1 C) or higher. SEEK IMMEDIATE MEDICAL CARE IF:   You cough up bloody sputum that had not been present before.  You develop fever of 102 F (38.9 C)  or greater.  You develop  worsening pain at or near the incision site. MAKE SURE YOU:   Understand these instructions.  Will watch your condition.  Will get help right away if you are not doing well or get worse. Document Released: 03/17/2007 Document Revised: 01/27/2012 Document Reviewed: 05/18/2007 Exit[employer] Patient Information 2014 Exit[employer], [place].   ________________________________________________________________________  WHAT IS A BLOOD TRANSFUSION? Blood Transfusion Information  A transfusion is the replacement of blood or some of its parts. Blood is made up of multiple cells which provide different functions.  Red blood cells carry oxygen and are used for blood loss replacement.  White blood cells fight against infection.  Platelets control bleeding.  Plasma helps clot blood.  Other blood products are available for specialized needs, such as hemophilia or other clotting disorders. BEFORE THE TRANSFUSION  Who gives blood for transfusions?   Healthy volunteers who are fully evaluated to make sure their blood is safe. This is blood bank blood. Transfusion therapy is the safest it has ever been in the practice of medicine. Before blood is taken from a donor, a complete history is taken to make sure that person has no history of diseases nor engages in risky social behavior (examples are intravenous drug use or sexual activity with multiple partners). The donor's travel history is screened to minimize risk of transmitting infections, such as malaria. The donated blood is tested for signs of infectious diseases, such as HIV and hepatitis. The blood is then tested to be sure it is compatible with you in order to minimize the chance of a transfusion reaction. If you or a relative donates blood, this is often done in anticipation of surgery and is not appropriate for emergency situations. It takes many days to process the donated blood. RISKS AND COMPLICATIONS Although transfusion therapy is very safe and saves  many lives, the main dangers of transfusion include:   Getting an infectious disease.  Developing a transfusion reaction. This is an allergic reaction to something in the blood you were given. Every precaution is taken to prevent this. The decision to have a blood transfusion has been considered carefully by your caregiver before blood is given. Blood is not given unless the benefits outweigh the risks. AFTER THE TRANSFUSION  Right after receiving a blood transfusion, you will usually feel much better and more energetic. This is especially true if your red blood cells have gotten low (anemic). The transfusion raises the level of the red blood cells which carry oxygen, and this usually causes an energy increase.  The nurse administering the transfusion will monitor you carefully for complications. HOME CARE INSTRUCTIONS  No special instructions are needed after a transfusion. You may find your energy is better. Speak with your caregiver about any limitations on activity for underlying diseases you may have. SEEK MEDICAL CARE IF:   Your condition is not improving after your transfusion.  You develop redness or irritation at the intravenous (IV) site. SEEK IMMEDIATE MEDICAL CARE IF:  Any of the following symptoms occur over the next 12 hours:  Shaking chills.  You have a temperature by mouth above 102 F (38.9 C), not controlled by medicine.  Chest, back, or muscle pain.  People around you feel you are not acting correctly or are confused.  Shortness of breath or difficulty breathing.  Dizziness and fainting.  You get a rash or develop hives.  You have a decrease in urine output.  Your urine turns a dark color or changes to pink, red,  or brown. Any of the following symptoms occur over the next 10 days:  You have a temperature by mouth above 102 F (38.9 C), not controlled by medicine.  Shortness of breath.  Weakness after normal activity.  The white part of the eye turns  yellow (jaundice).  You have a decrease in the amount of urine or are urinating less often.  Your urine turns a dark color or changes to pink, red, or brown. Document Released: 11/01/2000 Document Revised: 01/27/2012 Document Reviewed: 06/20/2008 [facility] Patient Information 2014 [place], [place].  _______________________________________________________________________

## 2020-05-30 ENCOUNTER — Encounter (HOSPITAL_COMMUNITY)
Admission: RE | Admit: 2020-05-30 | Discharge: 2020-05-30 | Disposition: A | Discharge status: Home / Self Care | Payer: PPO | Source: Ambulatory Visit | Attending: Orthopedic Surgery | Admitting: Orthopedic Surgery

## 2020-05-30 ENCOUNTER — Other Ambulatory Visit: Payer: Self-pay

## 2020-05-30 ENCOUNTER — Encounter (HOSPITAL_COMMUNITY): Payer: Self-pay

## 2020-05-30 DIAGNOSIS — Z01812 Encounter for preprocedural laboratory examination: Secondary | ICD-10-CM | POA: Diagnosis not present

## 2020-05-30 LAB — BASIC METABOLIC PANEL
Anion gap: 9 (ref 5–15)
BUN: 21 mg/dL (ref 8–23)
CO2: 24 mmol/L (ref 22–32)
Calcium: 9.6 mg/dL (ref 8.9–10.3)
Chloride: 106 mmol/L (ref 98–111)
Creatinine, Ser: 1.31 mg/dL — ABNORMAL HIGH (ref 0.44–1.00)
GFR calc Af Amer: 45 mL/min — ABNORMAL LOW (ref >60)
GFR calc non Af Amer: 39 mL/min — ABNORMAL LOW (ref >60)
Glucose, Bld: 108 mg/dL — ABNORMAL HIGH (ref 70–99)
Potassium: 3.7 mmol/L (ref 3.5–5.1)
Sodium: 139 mmol/L (ref 135–145)

## 2020-05-30 LAB — CBC
HCT: 41.3 % (ref 36.0–46.0)
Hemoglobin: 13.7 g/dL (ref 12.0–15.0)
MCH: 31.7 pg (ref 26.0–34.0)
MCHC: 33.2 g/dL (ref 30.0–36.0)
MCV: 95.6 fL (ref 80.0–100.0)
Platelets: 237 10*3/uL (ref 150–400)
RBC: 4.32 MIL/uL (ref 3.87–5.11)
RDW: 13.1 % (ref 11.5–15.5)
WBC: 7 10*3/uL (ref 4.0–10.5)
nRBC: 0 % (ref 0.0–0.2)

## 2020-05-30 LAB — SURGICAL PCR SCREEN
MRSA, PCR: NEGATIVE
Staphylococcus aureus: NEGATIVE

## 2020-06-01 DIAGNOSIS — M1990 Unspecified osteoarthritis, unspecified site: Secondary | ICD-10-CM | POA: Diagnosis not present

## 2020-06-01 DIAGNOSIS — I10 Essential (primary) hypertension: Secondary | ICD-10-CM | POA: Diagnosis not present

## 2020-06-01 DIAGNOSIS — E7800 Pure hypercholesterolemia, unspecified: Secondary | ICD-10-CM | POA: Diagnosis not present

## 2020-06-02 ENCOUNTER — Other Ambulatory Visit (HOSPITAL_COMMUNITY)
Admission: RE | Admit: 2020-06-02 | Discharge: 2020-06-02 | Disposition: A | Discharge status: Home / Self Care | Payer: PPO | Source: Ambulatory Visit | Attending: Orthopedic Surgery | Admitting: Orthopedic Surgery

## 2020-06-02 DIAGNOSIS — Z01818 Encounter for other preprocedural examination: Secondary | ICD-10-CM | POA: Diagnosis not present

## 2020-06-02 DIAGNOSIS — Z20822 Contact with and (suspected) exposure to covid-19: Secondary | ICD-10-CM | POA: Diagnosis not present

## 2020-06-02 LAB — SARS CORONAVIRUS 2 (TAT 6-24 HRS): SARS Coronavirus 2: NEGATIVE

## 2020-06-06 ENCOUNTER — Encounter (HOSPITAL_COMMUNITY)
Admission: RE | Disposition: A | Discharge status: Home / Self Care | Payer: Self-pay | Source: Home / Self Care | Attending: Orthopedic Surgery

## 2020-06-06 ENCOUNTER — Observation Stay (HOSPITAL_COMMUNITY)
Admission: RE | Admit: 2020-06-06 | Discharge: 2020-06-07 | Disposition: A | Discharge status: Home / Self Care | Payer: PPO | Attending: Orthopedic Surgery | Admitting: Orthopedic Surgery

## 2020-06-06 ENCOUNTER — Other Ambulatory Visit: Payer: Self-pay

## 2020-06-06 ENCOUNTER — Encounter (HOSPITAL_COMMUNITY): Payer: Self-pay | Admitting: Orthopedic Surgery

## 2020-06-06 ENCOUNTER — Ambulatory Visit (HOSPITAL_COMMUNITY): Payer: PPO | Admitting: Certified Registered"

## 2020-06-06 DIAGNOSIS — E876 Hypokalemia: Secondary | ICD-10-CM | POA: Diagnosis not present

## 2020-06-06 DIAGNOSIS — M1711 Unilateral primary osteoarthritis, right knee: Secondary | ICD-10-CM | POA: Diagnosis not present

## 2020-06-06 DIAGNOSIS — Z96651 Presence of right artificial knee joint: Secondary | ICD-10-CM

## 2020-06-06 DIAGNOSIS — J45909 Unspecified asthma, uncomplicated: Secondary | ICD-10-CM | POA: Insufficient documentation

## 2020-06-06 DIAGNOSIS — E785 Hyperlipidemia, unspecified: Secondary | ICD-10-CM | POA: Diagnosis not present

## 2020-06-06 DIAGNOSIS — I10 Essential (primary) hypertension: Secondary | ICD-10-CM | POA: Diagnosis not present

## 2020-06-06 DIAGNOSIS — E669 Obesity, unspecified: Secondary | ICD-10-CM | POA: Diagnosis present

## 2020-06-06 DIAGNOSIS — G8918 Other acute postprocedural pain: Secondary | ICD-10-CM | POA: Diagnosis not present

## 2020-06-06 HISTORY — PX: TOTAL KNEE ARTHROPLASTY: SHX125

## 2020-06-06 LAB — TYPE AND SCREEN
ABO/RH(D): O POS
Antibody Screen: NEGATIVE

## 2020-06-06 SURGERY — ARTHROPLASTY, KNEE, TOTAL
Anesthesia: Spinal | Site: Knee | Laterality: Right

## 2020-06-06 MED ORDER — FENTANYL CITRATE (PF) 100 MCG/2ML IJ SOLN
50.0000 ug | Freq: Once | INTRAMUSCULAR | Status: AC
  Administered 2020-06-06: 50 ug via INTRAVENOUS
  Filled 2020-06-06: qty 2

## 2020-06-06 MED ORDER — LACTATED RINGERS IV SOLN: INTRAVENOUS | Status: DC

## 2020-06-06 MED ORDER — SODIUM CHLORIDE (PF) 0.9 % IJ SOLN
INTRAMUSCULAR | Status: DC | PRN
  Administered 2020-06-06: 50 mL

## 2020-06-06 MED ORDER — PROPOFOL 10 MG/ML IV BOLUS
INTRAVENOUS | Status: AC
  Filled 2020-06-06: qty 20

## 2020-06-06 MED ORDER — CEFAZOLIN SODIUM-DEXTROSE 2-4 GM/100ML-% IV SOLN
2.0000 g | INTRAVENOUS | Status: AC
  Administered 2020-06-06: 2 g via INTRAVENOUS
  Filled 2020-06-06: qty 100

## 2020-06-06 MED ORDER — HYDROMORPHONE HCL 1 MG/ML IJ SOLN
INTRAMUSCULAR | Status: AC
  Administered 2020-06-06: 0.25 mg via INTRAVENOUS
  Filled 2020-06-06: qty 2

## 2020-06-06 MED ORDER — TOBRAMYCIN SULFATE 1.2 G IJ SOLR
INTRAMUSCULAR | Status: AC
  Filled 2020-06-06: qty 1.2

## 2020-06-06 MED ORDER — SODIUM CHLORIDE (PF) 0.9 % IJ SOLN
INTRAMUSCULAR | Status: AC
  Filled 2020-06-06: qty 50

## 2020-06-06 MED ORDER — FENTANYL CITRATE (PF) 100 MCG/2ML IJ SOLN
INTRAMUSCULAR | Status: DC | PRN
  Administered 2020-06-06 (×2): 25 ug via INTRAVENOUS
  Administered 2020-06-06: 50 ug via INTRAVENOUS
  Administered 2020-06-06 (×2): 25 ug via INTRAVENOUS

## 2020-06-06 MED ORDER — GLYCOPYRROLATE PF 0.2 MG/ML IJ SOSY
PREFILLED_SYRINGE | INTRAMUSCULAR | Status: DC | PRN
  Administered 2020-06-06: .2 mg via INTRAVENOUS

## 2020-06-06 MED ORDER — POLYETHYLENE GLYCOL 3350 17 G PO PACK
17.0000 g | PACK | Freq: Two times a day (BID) | ORAL | Status: DC
  Administered 2020-06-07: 17 g via ORAL
  Filled 2020-06-06: qty 1

## 2020-06-06 MED ORDER — METHOCARBAMOL 500 MG PO TABS
500.0000 mg | ORAL_TABLET | Freq: Four times a day (QID) | ORAL | Status: DC | PRN
  Administered 2020-06-07: 500 mg via ORAL
  Filled 2020-06-06: qty 1

## 2020-06-06 MED ORDER — ACETAMINOPHEN 325 MG PO TABS: 325.0000 mg | ORAL_TABLET | Freq: Four times a day (QID) | ORAL | Status: DC | PRN

## 2020-06-06 MED ORDER — PROPOFOL 10 MG/ML IV BOLUS
INTRAVENOUS | Status: DC | PRN
  Administered 2020-06-06: 130 mg via INTRAVENOUS
  Administered 2020-06-06 (×3): 30 mg via INTRAVENOUS
  Administered 2020-06-06: 10 mg via INTRAVENOUS
  Administered 2020-06-06: 20 mg via INTRAVENOUS

## 2020-06-06 MED ORDER — ONDANSETRON HCL 4 MG PO TABS
4.0000 mg | ORAL_TABLET | Freq: Four times a day (QID) | ORAL | Status: DC | PRN
  Filled 2020-06-06: qty 1

## 2020-06-06 MED ORDER — DEXAMETHASONE SODIUM PHOSPHATE 10 MG/ML IJ SOLN: 10.0000 mg | Freq: Once | INTRAMUSCULAR | Status: DC

## 2020-06-06 MED ORDER — METOCLOPRAMIDE HCL 5 MG PO TABS
5.0000 mg | ORAL_TABLET | Freq: Three times a day (TID) | ORAL | Status: DC | PRN
  Filled 2020-06-06: qty 2

## 2020-06-06 MED ORDER — TRAMADOL HCL 50 MG PO TABS
50.0000 mg | ORAL_TABLET | Freq: Four times a day (QID) | ORAL | Status: DC
  Administered 2020-06-06 – 2020-06-07 (×2): 50 mg via ORAL
  Filled 2020-06-06 (×2): qty 1

## 2020-06-06 MED ORDER — SODIUM CHLORIDE 0.9 % IR SOLN
Status: DC | PRN
  Administered 2020-06-06: 1000 mL

## 2020-06-06 MED ORDER — ALUM & MAG HYDROXIDE-SIMETH 200-200-20 MG/5ML PO SUSP
15.0000 mL | ORAL | Status: DC | PRN
  Administered 2020-06-07: 15 mL via ORAL
  Filled 2020-06-06: qty 30

## 2020-06-06 MED ORDER — TRANEXAMIC ACID-NACL 1000-0.7 MG/100ML-% IV SOLN: 1000.0000 mg | Freq: Once | INTRAVENOUS | Status: DC

## 2020-06-06 MED ORDER — EPHEDRINE SULFATE-NACL 50-0.9 MG/10ML-% IV SOSY
PREFILLED_SYRINGE | INTRAVENOUS | Status: DC | PRN
  Administered 2020-06-06 (×2): 10 mg via INTRAVENOUS

## 2020-06-06 MED ORDER — ORAL CARE MOUTH RINSE: 15.0000 mL | Freq: Once | OROMUCOSAL | Status: AC

## 2020-06-06 MED ORDER — STERILE WATER FOR IRRIGATION IR SOLN
Status: DC | PRN
  Administered 2020-06-06: 2000 mL

## 2020-06-06 MED ORDER — TRANEXAMIC ACID-NACL 1000-0.7 MG/100ML-% IV SOLN
1000.0000 mg | INTRAVENOUS | Status: AC
  Administered 2020-06-06: 1000 mg via INTRAVENOUS
  Filled 2020-06-06: qty 100

## 2020-06-06 MED ORDER — HYDROMORPHONE HCL 1 MG/ML IJ SOLN: 0.5000 mg | INTRAMUSCULAR | Status: DC | PRN

## 2020-06-06 MED ORDER — ASPIRIN 81 MG PO CHEW
81.0000 mg | CHEWABLE_TABLET | Freq: Two times a day (BID) | ORAL | Status: DC
  Administered 2020-06-06 – 2020-06-07 (×2): 81 mg via ORAL
  Filled 2020-06-06 (×2): qty 1

## 2020-06-06 MED ORDER — FERROUS SULFATE 325 (65 FE) MG PO TABS
325.0000 mg | ORAL_TABLET | Freq: Two times a day (BID) | ORAL | Status: DC
  Administered 2020-06-07: 325 mg via ORAL
  Filled 2020-06-06: qty 1

## 2020-06-06 MED ORDER — MENTHOL 3 MG MT LOZG: 1.0000 | LOZENGE | OROMUCOSAL | Status: DC | PRN

## 2020-06-06 MED ORDER — METHOCARBAMOL 500 MG IVPB - SIMPLE MED
500.0000 mg | Freq: Four times a day (QID) | INTRAVENOUS | Status: DC | PRN
  Filled 2020-06-06: qty 50

## 2020-06-06 MED ORDER — MAGNESIUM CITRATE PO SOLN: 1.0000 | Freq: Once | ORAL | Status: DC | PRN

## 2020-06-06 MED ORDER — ONDANSETRON HCL 4 MG/2ML IJ SOLN
INTRAMUSCULAR | Status: AC
  Filled 2020-06-06: qty 2

## 2020-06-06 MED ORDER — KETOROLAC TROMETHAMINE 30 MG/ML IJ SOLN
INTRAMUSCULAR | Status: DC | PRN
  Administered 2020-06-06: 30 mg via INTRA_ARTICULAR

## 2020-06-06 MED ORDER — DIPHENHYDRAMINE HCL 12.5 MG/5ML PO ELIX: 12.5000 mg | ORAL_SOLUTION | ORAL | Status: DC | PRN

## 2020-06-06 MED ORDER — EPHEDRINE 5 MG/ML INJ
INTRAVENOUS | Status: AC
  Filled 2020-06-06: qty 10

## 2020-06-06 MED ORDER — DEXAMETHASONE SODIUM PHOSPHATE 10 MG/ML IJ SOLN
INTRAMUSCULAR | Status: AC
  Filled 2020-06-06: qty 1

## 2020-06-06 MED ORDER — HYDROCODONE-ACETAMINOPHEN 7.5-325 MG PO TABS
1.0000 | ORAL_TABLET | ORAL | Status: DC | PRN
  Administered 2020-06-06: 1 via ORAL
  Filled 2020-06-06: qty 1

## 2020-06-06 MED ORDER — HYDROMORPHONE HCL 1 MG/ML IJ SOLN
0.2500 mg | INTRAMUSCULAR | Status: DC | PRN
  Administered 2020-06-06: 0.25 mg via INTRAVENOUS
  Administered 2020-06-06 (×3): 0.5 mg via INTRAVENOUS

## 2020-06-06 MED ORDER — VANCOMYCIN HCL 1000 MG IV SOLR
INTRAVENOUS | Status: AC
  Filled 2020-06-06: qty 1000

## 2020-06-06 MED ORDER — HYDROCODONE-ACETAMINOPHEN 5-325 MG PO TABS
1.0000 | ORAL_TABLET | ORAL | Status: DC | PRN
  Administered 2020-06-07 (×2): 2 via ORAL
  Filled 2020-06-06 (×2): qty 2

## 2020-06-06 MED ORDER — 0.9 % SODIUM CHLORIDE (POUR BTL) OPTIME
TOPICAL | Status: DC | PRN
  Administered 2020-06-06: 1000 mL

## 2020-06-06 MED ORDER — FENTANYL CITRATE (PF) 100 MCG/2ML IJ SOLN
INTRAMUSCULAR | Status: AC
  Filled 2020-06-06: qty 2

## 2020-06-06 MED ORDER — BUPIVACAINE HCL (PF) 0.5 % IJ SOLN
INTRAMUSCULAR | Status: DC | PRN
Start: 2020-06-06 — End: 2020-06-06
  Administered 2020-06-06: 20 mL via PERINEURAL

## 2020-06-06 MED ORDER — DOCUSATE SODIUM 100 MG PO CAPS
100.0000 mg | ORAL_CAPSULE | Freq: Two times a day (BID) | ORAL | Status: DC
  Administered 2020-06-06 – 2020-06-07 (×2): 100 mg via ORAL
  Filled 2020-06-06 (×2): qty 1

## 2020-06-06 MED ORDER — TRANEXAMIC ACID-NACL 1000-0.7 MG/100ML-% IV SOLN
INTRAVENOUS | Status: AC
  Filled 2020-06-06: qty 100

## 2020-06-06 MED ORDER — METOCLOPRAMIDE HCL 5 MG/ML IJ SOLN: 5.0000 mg | Freq: Three times a day (TID) | INTRAMUSCULAR | Status: DC | PRN

## 2020-06-06 MED ORDER — ALBUTEROL SULFATE (2.5 MG/3ML) 0.083% IN NEBU: 3.0000 mL | INHALATION_SOLUTION | Freq: Four times a day (QID) | RESPIRATORY_TRACT | Status: DC | PRN

## 2020-06-06 MED ORDER — CHLORHEXIDINE GLUCONATE 0.12 % MT SOLN
15.0000 mL | Freq: Once | OROMUCOSAL | Status: AC
  Administered 2020-06-06: 15 mL via OROMUCOSAL

## 2020-06-06 MED ORDER — BISOPROLOL-HYDROCHLOROTHIAZIDE 5-6.25 MG PO TABS
1.0000 | ORAL_TABLET | Freq: Every day | ORAL | Status: DC
  Administered 2020-06-06: 1 via ORAL
  Filled 2020-06-06: qty 1

## 2020-06-06 MED ORDER — SERTRALINE HCL 50 MG PO TABS
150.0000 mg | ORAL_TABLET | Freq: Every day | ORAL | Status: DC
  Administered 2020-06-06: 150 mg via ORAL
  Filled 2020-06-06: qty 1

## 2020-06-06 MED ORDER — CEFAZOLIN SODIUM-DEXTROSE 2-4 GM/100ML-% IV SOLN
INTRAVENOUS | Status: AC
  Administered 2020-06-06: 2 g via INTRAVENOUS
  Filled 2020-06-06: qty 100

## 2020-06-06 MED ORDER — BUPIVACAINE-EPINEPHRINE (PF) 0.25% -1:200000 IJ SOLN
INTRAMUSCULAR | Status: DC | PRN
  Administered 2020-06-06: 30 mL

## 2020-06-06 MED ORDER — KETOROLAC TROMETHAMINE 30 MG/ML IJ SOLN
INTRAMUSCULAR | Status: AC
  Filled 2020-06-06: qty 1

## 2020-06-06 MED ORDER — CEFAZOLIN SODIUM-DEXTROSE 2-4 GM/100ML-% IV SOLN
2.0000 g | Freq: Four times a day (QID) | INTRAVENOUS | Status: AC
  Administered 2020-06-06: 2 g via INTRAVENOUS
  Filled 2020-06-06: qty 100

## 2020-06-06 MED ORDER — TRAMADOL HCL 50 MG PO TABS
ORAL_TABLET | ORAL | Status: AC
  Administered 2020-06-06: 50 mg via ORAL
  Filled 2020-06-06: qty 1

## 2020-06-06 MED ORDER — BISACODYL 10 MG RE SUPP: 10.0000 mg | Freq: Every day | RECTAL | Status: DC | PRN

## 2020-06-06 MED ORDER — METHOCARBAMOL 500 MG IVPB - SIMPLE MED
INTRAVENOUS | Status: AC
  Administered 2020-06-06: 500 mg via INTRAVENOUS
  Filled 2020-06-06: qty 50

## 2020-06-06 MED ORDER — LIDOCAINE 2% (20 MG/ML) 5 ML SYRINGE
INTRAMUSCULAR | Status: DC | PRN
Start: 2020-06-06 — End: 2020-06-06
  Administered 2020-06-06: 100 mg via INTRAVENOUS

## 2020-06-06 MED ORDER — ONDANSETRON HCL 4 MG/2ML IJ SOLN: 4.0000 mg | Freq: Four times a day (QID) | INTRAMUSCULAR | Status: DC | PRN

## 2020-06-06 MED ORDER — POVIDONE-IODINE 10 % EX SWAB
2.0000 "application " | Freq: Once | CUTANEOUS | Status: AC
  Administered 2020-06-06: 2 via TOPICAL

## 2020-06-06 MED ORDER — PRAVASTATIN SODIUM 20 MG PO TABS
40.0000 mg | ORAL_TABLET | Freq: Every evening | ORAL | Status: DC
  Administered 2020-06-06: 40 mg via ORAL
  Filled 2020-06-06: qty 2

## 2020-06-06 MED ORDER — PHENOL 1.4 % MT LIQD: 1.0000 | OROMUCOSAL | Status: DC | PRN

## 2020-06-06 MED ORDER — SODIUM CHLORIDE 0.9 % IV SOLN: INTRAVENOUS | Status: DC

## 2020-06-06 MED ORDER — TRANEXAMIC ACID-NACL 1000-0.7 MG/100ML-% IV SOLN
1000.0000 mg | Freq: Once | INTRAVENOUS | Status: AC
  Administered 2020-06-06: 1000 mg via INTRAVENOUS

## 2020-06-06 MED ORDER — DEXAMETHASONE SODIUM PHOSPHATE 10 MG/ML IJ SOLN
10.0000 mg | Freq: Once | INTRAMUSCULAR | Status: AC
  Administered 2020-06-07: 10 mg via INTRAVENOUS
  Filled 2020-06-06: qty 1

## 2020-06-06 MED ORDER — PROPOFOL 1000 MG/100ML IV EMUL
INTRAVENOUS | Status: AC
  Filled 2020-06-06: qty 100

## 2020-06-06 MED ORDER — ONDANSETRON HCL 4 MG/2ML IJ SOLN
INTRAMUSCULAR | Status: DC | PRN
  Administered 2020-06-06: 4 mg via INTRAVENOUS

## 2020-06-06 MED ORDER — BUPIVACAINE-EPINEPHRINE (PF) 0.25% -1:200000 IJ SOLN
INTRAMUSCULAR | Status: AC
  Filled 2020-06-06: qty 30

## 2020-06-06 MED ORDER — DEXAMETHASONE SODIUM PHOSPHATE 10 MG/ML IJ SOLN
INTRAMUSCULAR | Status: DC | PRN
  Administered 2020-06-06: 10 mg via INTRAVENOUS

## 2020-06-06 SURGICAL SUPPLY — 60 items
ATTUNE MED ANAT PAT 35 KNEE (Knees) ×2 IMPLANT
ATTUNE PSFEM RTSZ5 NARCEM KNEE (Femur) ×2 IMPLANT
ATTUNE PSRP INSR SZ5 5 KNEE (Insert) ×2 IMPLANT
BAG ZIPLOCK 12X15 (MISCELLANEOUS) IMPLANT
BASEPLATE TIBIAL ROTATING SZ 4 (Knees) ×2 IMPLANT
BLADE SAW SGTL 11.0X1.19X90.0M (BLADE) IMPLANT
BLADE SAW SGTL 13.0X1.19X90.0M (BLADE) ×2 IMPLANT
BLADE SURG SZ10 CARB STEEL (BLADE) ×4 IMPLANT
BNDG ELASTIC 6X5.8 VLCR STR LF (GAUZE/BANDAGES/DRESSINGS) ×2 IMPLANT
BOWL SMART MIX CTS (DISPOSABLE) ×2 IMPLANT
CEMENT HV SMART SET (Cement) ×4 IMPLANT
COVER SURGICAL LIGHT HANDLE (MISCELLANEOUS) ×2 IMPLANT
COVER WAND RF STERILE (DRAPES) IMPLANT
CUFF TOURN SGL QUICK 34 (TOURNIQUET CUFF) ×1
CUFF TRNQT CYL 34X4.125X (TOURNIQUET CUFF) ×1 IMPLANT
DECANTER SPIKE VIAL GLASS SM (MISCELLANEOUS) ×4 IMPLANT
DERMABOND ADVANCED (GAUZE/BANDAGES/DRESSINGS) ×2
DERMABOND ADVANCED .7 DNX12 (GAUZE/BANDAGES/DRESSINGS) ×2 IMPLANT
DRAPE U-SHAPE 47X51 STRL (DRAPES) ×2 IMPLANT
DRESSING AQUACEL AG SP 3.5X10 (GAUZE/BANDAGES/DRESSINGS) ×1 IMPLANT
DRSG AQUACEL AG SP 3.5X10 (GAUZE/BANDAGES/DRESSINGS) ×2
DURAPREP 26ML APPLICATOR (WOUND CARE) ×4 IMPLANT
ELECT REM PT RETURN 15FT ADLT (MISCELLANEOUS) ×2 IMPLANT
GLOVE BIO SURGEON STRL SZ 6 (GLOVE) ×2 IMPLANT
GLOVE BIOGEL PI IND STRL 6.5 (GLOVE) ×1 IMPLANT
GLOVE BIOGEL PI IND STRL 7.5 (GLOVE) ×1 IMPLANT
GLOVE BIOGEL PI IND STRL 8.5 (GLOVE) ×1 IMPLANT
GLOVE BIOGEL PI INDICATOR 6.5 (GLOVE) ×1
GLOVE BIOGEL PI INDICATOR 7.5 (GLOVE) ×1
GLOVE BIOGEL PI INDICATOR 8.5 (GLOVE) ×1
GLOVE ECLIPSE 8.0 STRL XLNG CF (GLOVE) ×2 IMPLANT
GLOVE ORTHO TXT STRL SZ7.5 (GLOVE) ×4 IMPLANT
GOWN STRL REUS W/ TWL LRG LVL3 (GOWN DISPOSABLE) ×1 IMPLANT
GOWN STRL REUS W/TWL 2XL LVL3 (GOWN DISPOSABLE) ×2 IMPLANT
GOWN STRL REUS W/TWL LRG LVL3 (GOWN DISPOSABLE) ×3 IMPLANT
HANDPIECE INTERPULSE COAX TIP (DISPOSABLE) ×1
HOLDER FOLEY CATH W/STRAP (MISCELLANEOUS) IMPLANT
KIT TURNOVER KIT A (KITS) IMPLANT
MANIFOLD NEPTUNE II (INSTRUMENTS) ×2 IMPLANT
NDL SAFETY ECLIPSE 18X1.5 (NEEDLE) IMPLANT
NEEDLE HYPO 18GX1.5 SHARP (NEEDLE)
NS IRRIG 1000ML POUR BTL (IV SOLUTION) ×2 IMPLANT
PACK TOTAL KNEE CUSTOM (KITS) ×2 IMPLANT
PENCIL SMOKE EVACUATOR (MISCELLANEOUS) ×2 IMPLANT
PIN DRILL FIX HALF THREAD (BIT) ×2 IMPLANT
PIN FIX SIGMA LCS THRD HI (PIN) ×2 IMPLANT
PROTECTOR NERVE ULNAR (MISCELLANEOUS) ×2 IMPLANT
SET HNDPC FAN SPRY TIP SCT (DISPOSABLE) ×1 IMPLANT
SET PAD KNEE POSITIONER (MISCELLANEOUS) ×2 IMPLANT
SPONGE LAP 18X18 RF (DISPOSABLE) ×2 IMPLANT
SUT MNCRL AB 4-0 PS2 18 (SUTURE) ×2 IMPLANT
SUT STRATAFIX PDS+ 0 24IN (SUTURE) ×2 IMPLANT
SUT VIC AB 1 CT1 36 (SUTURE) ×2 IMPLANT
SUT VIC AB 2-0 CT1 27 (SUTURE) ×3
SUT VIC AB 2-0 CT1 TAPERPNT 27 (SUTURE) ×3 IMPLANT
SYR 3ML LL SCALE MARK (SYRINGE) ×2 IMPLANT
TRAY FOLEY MTR SLVR 16FR STAT (SET/KITS/TRAYS/PACK) ×2 IMPLANT
WATER STERILE IRR 1000ML POUR (IV SOLUTION) ×4 IMPLANT
WRAP KNEE MAXI GEL POST OP (GAUZE/BANDAGES/DRESSINGS) ×2 IMPLANT
YANKAUER SUCT BULB TIP 10FT TU (MISCELLANEOUS) ×4 IMPLANT

## 2020-06-06 NOTE — Anesthesia Procedure Notes (Signed)
Anesthesia Procedure Image    

## 2020-06-06 NOTE — Anesthesia Procedure Notes (Signed)
Procedure Name: LMA Insertion Date/Time: 06/06/2020 11:13 AM Performed by: Niel Hummer, CRNA Pre-anesthesia Checklist: Patient identified, Emergency Drugs available, Suction available and Patient being monitored Patient Re-evaluated:Patient Re-evaluated prior to induction Oxygen Delivery Method: Circle system utilized Preoxygenation: Pre-oxygenation with 100% oxygen Induction Type: IV induction LMA: LMA inserted LMA Size: 4.0 Number of attempts: 1 Dental Injury: Teeth and Oropharynx as per pre-operative assessment

## 2020-06-06 NOTE — Progress Notes (Addendum)
NT called RN into room secondary to patient's pulse ox, and dynamap reading a HR in the 30's (range: 31-42). Listened to patient's apical pulse which was approximately 52.   Paged on call provider via call center at [phone], at 1820.   In the interim, obtained EKG (as this is not a telemetry equipped unit).   EKG showed HR in the 70's to low 80's.   Then switched patient's pulse ox to another piece of equipment. This second pulse ox was reading the patient's heart rate in the 70's.    Patient denied dizziness, headaches, cheat pain, palpitations, shortness of breath, nausea, numbness or tingling.   She reported no accompanying symptoms with this "brady" episode.   Patient then reported having history of LBBB. Patient's EKG does show this reading.   S[place]ittemore, Therapist, sports  . Marland Kitchen Corliss Marcus, PA-C called at [phone] after speaking with office.   I informed her what I did, and the assessments that I made. After I communicated this information she was comfortable with just watching the patient.

## 2020-06-06 NOTE — Discharge Instructions (Signed)

## 2020-06-06 NOTE — Plan of Care (Signed)
Plan of care for postop day 0. All questions answered.   Will continue to monitor to patient.     SWhittemore, Therapist, sports

## 2020-06-06 NOTE — Interval H&P Note (Signed)
History and Physical Interval Note:  06/06/2020 9:48 AM  Samantha Jenkins  has presented today for surgery, with the diagnosis of Right knee osteoarthritis.  The various methods of treatment have been discussed with the patient and family. After consideration of risks, benefits and other options for treatment, the patient has consented to  Procedure(s) with comments: TOTAL KNEE ARTHROPLASTY (Right) - 70 mins as a surgical intervention.  The patient's history has been reviewed, patient examined, no change in status, stable for surgery.  I have reviewed the patient's chart and labs.  Questions were answered to the patient's satisfaction.     Mauri Pole

## 2020-06-06 NOTE — Progress Notes (Signed)
Assisted Dr. Rose with right, ultrasound guided, adductor canal block. Side rails up, monitors on throughout procedure. See vital signs in flow sheet. Tolerated Procedure well.  

## 2020-06-06 NOTE — Anesthesia Procedure Notes (Signed)
Anesthesia Regional Block: Adductor canal block   Pre-Anesthetic Checklist: ,, timeout performed, Correct Patient, Correct Site, Correct Laterality, Correct Procedure, Correct Position, site marked, Risks and benefits discussed,  Surgical consent,  Pre-op evaluation,  At surgeon's request and post-op pain management  Laterality: Right  Prep: chloraprep       Needles:  Injection technique: Single-shot  Needle Type: Echogenic Needle     Needle Length: 9cm      Additional Needles:   Procedures:,,,, ultrasound used (permanent image in chart),,,,  Narrative:  Start time: 06/06/2020 10:12 AM End time: 06/06/2020 10:18 AM Injection made incrementally with aspirations every 5 mL.  Performed by: Personally  Anesthesiologist: Myrtie Soman, MD  Additional Notes: Patient tolerated the procedure well without complications

## 2020-06-06 NOTE — Transfer of Care (Signed)
Immediate Anesthesia Transfer of Care Note  Patient: Samantha Jenkins  Procedure(s) Performed: TOTAL KNEE ARTHROPLASTY (Right Knee)  Patient Location: PACU  Anesthesia Type:General  Level of Consciousness: awake  Airway & Oxygen Therapy: Patient Spontanous Breathing and Patient connected to face mask oxygen  Post-op Assessment: Report given to RN, Post -op Vital signs reviewed and stable and Patient moving all extremities X 4  Post vital signs: Reviewed and stable  Last Vitals:  Vitals Value Taken Time  BP    Temp    Pulse 71 06/06/20 1253  Resp 17 06/06/20 1253  SpO2 100 % 06/06/20 1253  Vitals shown include unvalidated device data.  Last Pain:  Vitals:   06/06/20 0957  TempSrc:   PainSc: 0-No pain      Patients Stated Pain Goal: 3 (80/88/11 0315)  Complications: No complications documented.

## 2020-06-06 NOTE — Anesthesia Postprocedure Evaluation (Signed)
Anesthesia Post Note  Patient: Samantha Jenkins  Procedure(s) Performed: TOTAL KNEE ARTHROPLASTY (Right Knee)     Patient location during evaluation: PACU Anesthesia Type: General Level of consciousness: awake and alert Pain management: pain level controlled Vital Signs Assessment: post-procedure vital signs reviewed and stable Respiratory status: spontaneous breathing, nonlabored ventilation, respiratory function stable and patient connected to nasal cannula oxygen Cardiovascular status: blood pressure returned to baseline and stable Postop Assessment: no apparent nausea or vomiting Anesthetic complications: no   No complications documented.  Last Vitals:  Vitals:   06/06/20 1500 06/06/20 1600  BP: (!) 141/69 123/67  Pulse: 67 67  Resp: 12 15  Temp: 36.5 C 36.6 C  SpO2: 99% 97%    Last Pain:  Vitals:   06/06/20 1600  TempSrc:   PainSc: 1                  Yossef Gilkison S

## 2020-06-06 NOTE — Anesthesia Preprocedure Evaluation (Signed)
Anesthesia Evaluation  Patient identified by MRN, date of birth, ID band Patient awake    Reviewed: Allergy & Precautions, NPO status , Patient's Chart, lab work & pertinent test results  Airway Mallampati: II  TM Distance: >3 FB Neck ROM: Full    Dental no notable dental hx.    Pulmonary asthma , former smoker,    Pulmonary exam normal breath sounds clear to auscultation       Cardiovascular hypertension, Pt. on medications and Pt. on home beta blockers Normal cardiovascular exam Rhythm:Regular Rate:Normal  LBBB   Neuro/Psych negative neurological ROS  negative psych ROS   GI/Hepatic negative GI ROS, Neg liver ROS,   Endo/Other  negative endocrine ROS  Renal/GU negative Renal ROS  negative genitourinary   Musculoskeletal negative musculoskeletal ROS (+)   Abdominal   Peds negative pediatric ROS (+)  Hematology negative hematology ROS (+)   Anesthesia Other Findings   Reproductive/Obstetrics negative OB ROS                             Anesthesia Physical Anesthesia Plan  ASA: III  Anesthesia Plan: Spinal   Post-op Pain Management:  Regional for Post-op pain   Induction: Intravenous  PONV Risk Score and Plan: 2 and Ondansetron, Dexamethasone and Treatment may vary due to age or medical condition  Airway Management Planned: Simple Face Mask  Additional Equipment:   Intra-op Plan:   Post-operative Plan:   Informed Consent: I have reviewed the patients History and Physical, chart, labs and discussed the procedure including the risks, benefits and alternatives for the proposed anesthesia with the patient or authorized representative who has indicated his/her understanding and acceptance.     Dental advisory given  Plan Discussed with: CRNA and Surgeon  Anesthesia Plan Comments:         Anesthesia Quick Evaluation

## 2020-06-06 NOTE — Op Note (Signed)
NAME:  Samantha Jenkins                      MEDICAL RECORD NO.:  [id number]                             FACILITY:  [facility]      PHYSICIAN:  Pietro Cassis. Alvan Dame, M.D.  DATE OF BIRTH:  [birth year]      DATE OF PROCEDURE:  06/06/2020                                     OPERATIVE REPORT         PREOPERATIVE DIAGNOSIS:  Right knee osteoarthritis.      POSTOPERATIVE DIAGNOSIS:  Right knee osteoarthritis.      FINDINGS:  The patient was noted to have complete loss of cartilage and   bone-on-bone arthritis with associated osteophytes in the lateral and patellofemoral compartments of   the knee with a significant synovitis and associated effusion.  The patient had failed months of conservative treatment including medications, injection therapy, activity modification.     PROCEDURE:  Right total knee replacement.      COMPONENTS USED:  DePuy Attune rotating platform posterior stabilized knee   system, a size 5N femur, size 4 tibia, size 5 mm PS AOX insert, and 35 anatomic patellar   button.      SURGEON:  Pietro Cassis. Alvan Dame, M.D.      ASSISTANT:  Danae Orleans, PA-C.      ANESTHESIA:  General and Regional.      SPECIMENS:  None.      COMPLICATION:  None.      DRAINS:  None.  EBL: about 300cc due to basically tourniquetless knee replacement      TOURNIQUET TIME:   Total Tourniquet Time Documented: Thigh (Right) - 1 minutes Thigh (Right) - 10 minutes Total: Thigh (Right) - 11 minutes  .      The patient was stable to the recovery room.      INDICATION FOR PROCEDURE:  Samantha Jenkins is a 79 y.o. female patient of   mine.  The patient had been seen, evaluated, and treated for months conservatively in the   office with medication, activity modification, and injections.  The patient had   radiographic changes of bone-on-bone arthritis with endplate sclerosis and osteophytes noted.  Based on the radiographic changes and failed conservative measures, the patient   decided to proceed with  definitive treatment, total knee replacement.  Risks of infection, DVT, component failure, need for revision surgery, neurovascular injury were reviewed in the office setting.  The postop course was reviewed stressing the efforts to maximize post-operative satisfaction and function.  Consent was obtained for benefit of pain   relief.      PROCEDURE IN DETAIL:  The patient was brought to the operative theater.   Once adequate anesthesia, preoperative antibiotics, 2 gm of Ancef,1 gm of Tranexamic Acid, and 10 mg of Decadron administered, the patient was positioned supine with a right thigh tourniquet placed.  The  right lower extremity was prepped and draped in sterile fashion.  A time-   out was performed identifying the patient, planned procedure, and the appropriate extremity.      The right lower extremity was placed in the [facility] leg holder.  The leg was   exsanguinated,  tourniquet elevated to 250 mmHg.  A midline incision was   made followed by median parapatellar arthrotomy.  Following initial   exposure, attention was first directed to the patella.  Precut   measurement was noted to be 20 mm.  I resected down to 13 mm and used a   35 anatomic patellar button to restore patellar height as well as cover the cut surface.      The lug holes were drilled and a metal shim was placed to protect the   patella from retractors and saw blade during the procedure.      At this point, attention was now directed to the femur.  The femoral   canal was opened with a drill, irrigated to try to prevent fat emboli.  An   intramedullary rod was passed at 3 degrees valgus, 9 mm of bone was   resected off the distal femur.  Following this resection, the tibia was   subluxated anteriorly.  Using the extramedullary guide, 4 mm of bone was resected off   the proximal medial tibia.  We confirmed the gap would be   stable medially and laterally with a size 5 spacer block as well as confirmed that the tibial cut  was perpendicular in the coronal plane, checking with an alignment rod.      Once this was done, I sized the femur to be a size 5 in the anterior-   posterior dimension, chose a narrow component based on medial and   lateral dimension.  The size 5 rotation block was then pinned in   position anterior referenced using the C-clamp to set rotation.  The   anterior, posterior, and  chamfer cuts were made without difficulty nor   notching making certain that I was along the anterior cortex to help   with flexion gap stability.      The final box cut was made off the lateral aspect of distal femur.      At this point, the tibia was sized to be a size 4.  The size 4 tray was   then pinned in position through the medial third of the tubercle,   drilled, and keel punched.  Trial reduction was now carried with a 5 femur,  4 tibia, a size 5 mm PS insert, and the 35 anatomic patella botton.  The knee was brought to full extension with good flexion stability with the patella   tracking through the trochlea without application of pressure.    At this point with the trial components in place I re-exsanguinated and the tourniquet elevated again for the cementing of the final components.  Given all these findings the trial components removed.  Final components were   opened and cement was mixed.  The knee was irrigated with normal saline solution and pulse lavage.  The synovial lining was   then injected with 30 cc of 0.25% Marcaine with epinephrine, 1 cc of Toradol and 30 cc of NS for a total of 61 cc.     Final implants were then cemented onto cleaned and dried cut surfaces of bone with the knee brought to extension with a size 5 mm PS trial insert.      Once the cement had fully cured, excess cement was removed   throughout the knee.  I confirmed that I was satisfied with the range of   motion and stability, and the final size 5 mm PS AOX insert was chosen.  It was  placed into the knee.      The  tourniquet had been let down after a total of 11 minutes.  No significant   hemostasis was required.  The extensor mechanism was then reapproximated using #1 Vicryl and #1 Stratafix sutures with the knee   in flexion.  The   remaining wound was closed with 2-0 Vicryl and running 4-0 Monocryl.   The knee was cleaned, dried, dressed sterilely using Dermabond and   Aquacel dressing.  The patient was then   brought to recovery room in stable condition, tolerating the procedure   well.   Please note that Physician Assistant, Danae Orleans, PA-C was present for the entirety of the case, and was utilized for pre-operative positioning, peri-operative retractor management, general facilitation of the procedure and for primary wound closure at the end of the case.              Pietro Cassis Alvan Dame, M.D.    06/06/2020 1:40 PM

## 2020-06-07 ENCOUNTER — Encounter (HOSPITAL_COMMUNITY): Payer: Self-pay | Admitting: Orthopedic Surgery

## 2020-06-07 DIAGNOSIS — E876 Hypokalemia: Secondary | ICD-10-CM | POA: Diagnosis not present

## 2020-06-07 LAB — CBC
HCT: 33.6 % — ABNORMAL LOW (ref 36.0–46.0)
Hemoglobin: 11.1 g/dL — ABNORMAL LOW (ref 12.0–15.0)
MCH: 31.8 pg (ref 26.0–34.0)
MCHC: 33 g/dL (ref 30.0–36.0)
MCV: 96.3 fL (ref 80.0–100.0)
Platelets: 193 10*3/uL (ref 150–400)
RBC: 3.49 MIL/uL — ABNORMAL LOW (ref 3.87–5.11)
RDW: 13.5 % (ref 11.5–15.5)
WBC: 9.2 10*3/uL (ref 4.0–10.5)
nRBC: 0 % (ref 0.0–0.2)

## 2020-06-07 LAB — BASIC METABOLIC PANEL
Anion gap: 10 (ref 5–15)
BUN: 16 mg/dL (ref 8–23)
CO2: 24 mmol/L (ref 22–32)
Calcium: 8.8 mg/dL — ABNORMAL LOW (ref 8.9–10.3)
Chloride: 103 mmol/L (ref 98–111)
Creatinine, Ser: 1.28 mg/dL — ABNORMAL HIGH (ref 0.44–1.00)
GFR calc Af Amer: 46 mL/min — ABNORMAL LOW (ref >60)
GFR calc non Af Amer: 40 mL/min — ABNORMAL LOW (ref >60)
Glucose, Bld: 134 mg/dL — ABNORMAL HIGH (ref 70–99)
Potassium: 3.1 mmol/L — ABNORMAL LOW (ref 3.5–5.1)
Sodium: 137 mmol/L (ref 135–145)

## 2020-06-07 MED ORDER — DOCUSATE SODIUM 100 MG PO CAPS
100.0000 mg | ORAL_CAPSULE | Freq: Two times a day (BID) | ORAL | 0 refills | Status: DC
Start: 2020-06-07 — End: 2021-02-07

## 2020-06-07 MED ORDER — POLYETHYLENE GLYCOL 3350 17 G PO PACK
17.0000 g | PACK | Freq: Two times a day (BID) | ORAL | 0 refills | Status: DC
Start: 2020-06-07 — End: 2021-02-07

## 2020-06-07 MED ORDER — HYDROCODONE-ACETAMINOPHEN 5-325 MG PO TABS: 1.0000 | ORAL_TABLET | ORAL | 0 refills | Status: DC | PRN

## 2020-06-07 MED ORDER — POTASSIUM CHLORIDE CRYS ER 20 MEQ PO TBCR: 40.0000 meq | EXTENDED_RELEASE_TABLET | Freq: Two times a day (BID) | ORAL | Status: DC

## 2020-06-07 MED ORDER — ASPIRIN 81 MG PO CHEW: 81.0000 mg | CHEWABLE_TABLET | Freq: Two times a day (BID) | ORAL | 0 refills | Status: AC

## 2020-06-07 MED ORDER — POTASSIUM CHLORIDE CRYS ER 20 MEQ PO TBCR
40.0000 meq | EXTENDED_RELEASE_TABLET | Freq: Two times a day (BID) | ORAL | Status: DC
  Administered 2020-06-07: 40 meq via ORAL
  Filled 2020-06-07 (×2): qty 2

## 2020-06-07 MED ORDER — METHOCARBAMOL 500 MG PO TABS: 500.0000 mg | ORAL_TABLET | Freq: Four times a day (QID) | ORAL | 0 refills | Status: DC | PRN

## 2020-06-07 MED ORDER — FERROUS SULFATE 325 (65 FE) MG PO TABS: 325.0000 mg | ORAL_TABLET | Freq: Three times a day (TID) | ORAL | 0 refills | Status: DC

## 2020-06-07 NOTE — Evaluation (Signed)
Physical Therapy Evaluation Patient Details Name: Samantha Jenkins MRN: [id number] DOB: [birth year] Today's Date: 06/07/2020   History of Present Illness  Pt s/p R TKR and with hx of LBBB  Clinical Impression  Pt s/p R TKR and presents with decreased R LE strength/ROM and post op pain limiting functional mobility.  Pt should progress to dc home with assist of family and plans OPPT follow up.    Follow Up Recommendations Follow surgeon's recommendation for DC plan and follow-up therapies    Equipment Recommendations  Rolling walker with 5" wheels;3in1 (PT)    Recommendations for Other Services       Precautions / Restrictions Precautions Precautions: Fall;Knee Restrictions Weight Bearing Restrictions: No Other Position/Activity Restrictions: WBAT      Mobility  Bed Mobility Overal bed mobility: Needs Assistance Bed Mobility: Supine to Sit     Supine to sit: Min assist     General bed mobility comments: cues for sequence and use of L LE to self assist  Transfers Overall transfer level: Needs assistance Equipment used: Rolling walker (2 wheeled) Transfers: Sit to/from Stand Sit to Stand: Mod assist         General transfer comment: cues for LE management and use of UEs to self assist  Ambulation/Gait Ambulation/Gait assistance: Min assist Gait Distance (Feet): 95 Feet Assistive device: Rolling walker (2 wheeled) Gait Pattern/deviations: Step-to pattern;Step-through pattern;Decreased step length - right;Decreased step length - left;Shuffle;Trunk flexed     General Gait Details: cues for posture, position from RW and sequence  Stairs            Wheelchair Mobility    Modified Rankin (Stroke Patients Only)       Balance Overall balance assessment: Mild deficits observed, not formally tested                                           Pertinent Vitals/Pain Pain Assessment: 0-10 Pain Score: 4  Pain Location: R knee Pain Descriptors /  Indicators: Aching;Sore Pain Intervention(s): Limited activity within patient's tolerance;Monitored during session;Premedicated before session;Ice applied    Home Living Family/patient expects to be discharged to:: Private residence Living Arrangements: Children Available Help at Discharge: Family;Available 24 hours/day Type of Home: House Home Access: Stairs to enter   [employer] of Steps: 1 Home Layout: Two level Home Equipment: None      Prior Function Level of Independence: Independent               Hand Dominance        Extremity/Trunk Assessment   Upper Extremity Assessment Upper Extremity Assessment: Overall WFL for tasks assessed    Lower Extremity Assessment Lower Extremity Assessment: RLE deficits/detail RLE Deficits / Details: 3/5 quads with AAROM at knee -5 - 90    Cervical / Trunk Assessment Cervical / Trunk Assessment: Normal  Communication   Communication: No difficulties  Cognition Arousal/Alertness: Awake/alert Behavior During Therapy: WFL for tasks assessed/performed Overall Cognitive Status: Within Functional Limits for tasks assessed                                        General Comments      Exercises Total Joint Exercises Ankle Circles/Pumps: AROM;Both;15 reps;Supine Quad Sets: AROM;Both;10 reps;Supine Heel Slides: AAROM;Right;15 reps;Supine Straight Leg Raises: AAROM;AROM;Right;10 reps;Supine  Assessment/Plan    PT Assessment Patient needs continued PT services  PT Problem List Decreased strength;Decreased range of motion;Decreased activity tolerance;Decreased mobility;Decreased knowledge of use of DME;Pain       PT Treatment Interventions DME instruction;Functional mobility training;Gait training;Stair training;Therapeutic activities;Therapeutic exercise;Patient/family education    PT Goals (Current goals can be found in the Care Plan section)  Acute Rehab PT Goals Patient Stated Goal: Regain  IND PT Goal Formulation: With patient Time For Goal Achievement: 06/14/20 Potential to Achieve Goals: Good    Frequency 7X/week   Barriers to discharge        Co-evaluation               AM-PAC PT "6 Clicks" Mobility  Outcome Measure Help needed turning from your back to your side while in a flat bed without using bedrails?: A Little Help needed moving from lying on your back to sitting on the side of a flat bed without using bedrails?: A Little Help needed moving to and from a bed to a chair (including a wheelchair)?: A Lot Help needed standing up from a chair using your arms (e.g., wheelchair or bedside chair)?: A Lot Help needed to walk in hospital room?: A Little Help needed climbing 3-5 steps with a railing? : A Lot 6 Click Score: 15    End of Session Equipment Utilized During Treatment: Gait belt Activity Tolerance: Patient tolerated treatment well Patient left: in chair;with call bell/phone within reach;with chair alarm set Nurse Communication: Mobility status PT Visit Diagnosis: Unsteadiness on feet (R26.81);Difficulty in walking, not elsewhere classified (R26.2)    Time: 9758-8325 PT Time Calculation (min) (ACUTE ONLY): 31 min   Charges:   PT Evaluation $PT Eval Low Complexity: 1 Low PT Treatments $Therapeutic Exercise: 8-22 mins        Debe Coder PT Acute Rehabilitation Services Pager [phone] Office [phone]   Mylasia Vorhees 06/07/2020, 2:04 PM

## 2020-06-07 NOTE — Progress Notes (Signed)
Physical Therapy Treatment Patient Details Name: Samantha Jenkins MRN: [id number] DOB: 01/08/1941 Today's Date: 06/07/2020    History of Present Illness Pt s/p R TKR and with hx of LBBB    PT Comments    Pt progressing well with mobility and requiring decreased assist for all tasks.  Pt negotiated stairs and performed HEP with assist - written instruction provided.   Follow Up Recommendations  Follow surgeon's recommendation for DC plan and follow-up therapies     Equipment Recommendations  Rolling walker with 5" wheels;3in1 (PT)    Recommendations for Other Services       Precautions / Restrictions Precautions Precautions: Fall;Knee Restrictions Weight Bearing Restrictions: No Other Position/Activity Restrictions: WBAT    Mobility  Bed Mobility Overal bed mobility: Needs Assistance Bed Mobility: Supine to Sit     Supine to sit: Supervision     General bed mobility comments: cues for sequence and use of L LE to self assist  Transfers Overall transfer level: Needs assistance Equipment used: Rolling walker (2 wheeled) Transfers: Sit to/from Stand Sit to Stand: Min guard         General transfer comment: cues for LE management and use of UEs to self assist  Ambulation/Gait Ambulation/Gait assistance: Min guard;Supervision Gait Distance (Feet): 85 Feet Assistive device: Rolling walker (2 wheeled) Gait Pattern/deviations: Step-to pattern;Step-through pattern;Decreased step length - right;Decreased step length - left;Shuffle;Trunk flexed     General Gait Details: cues for posture, position from RW and sequence   Stairs Stairs: Yes Stairs assistance: Min assist Stair Management: No rails;One rail Left;Forwards;Step to pattern;With walker Number of Stairs: 7 General stair comments: single step twice fwd with RW; 5 steps with both hands on L rail; cues for sequence and foot/RW placement   Wheelchair Mobility    Modified Rankin (Stroke Patients Only)        Balance Overall balance assessment: Mild deficits observed, not formally tested                                          Cognition Arousal/Alertness: Awake/alert Behavior During Therapy: WFL for tasks assessed/performed Overall Cognitive Status: Within Functional Limits for tasks assessed                                        Exercises Total Joint Exercises Ankle Circles/Pumps: AROM;Both;15 reps;Supine Quad Sets: AROM;Both;10 reps;Supine Heel Slides: AAROM;Right;15 reps;Supine Straight Leg Raises: AAROM;AROM;Right;10 reps;Supine Goniometric ROM: AAROM L knee -5 - 100    General Comments        Pertinent Vitals/Pain Pain Assessment: 0-10 Pain Score: 6  Pain Location: R knee Pain Descriptors / Indicators: Aching;Sore Pain Intervention(s): Limited activity within patient's tolerance;Monitored during session;Premedicated before session;Ice applied    Home Living                      Prior Function            PT Goals (current goals can now be found in the care plan section) Acute Rehab PT Goals Patient Stated Goal: Regain IND PT Goal Formulation: With patient Time For Goal Achievement: 06/14/20 Potential to Achieve Goals: Good Progress towards PT goals: Progressing toward goals    Frequency    7X/week      PT Plan Current plan remains  appropriate    Co-evaluation              AM-PAC PT "6 Clicks" Mobility   Outcome Measure  Help needed turning from your back to your side while in a flat bed without using bedrails?: A Little Help needed moving from lying on your back to sitting on the side of a flat bed without using bedrails?: A Little Help needed moving to and from a bed to a chair (including a wheelchair)?: A Little Help needed standing up from a chair using your arms (e.g., wheelchair or bedside chair)?: A Little Help needed to walk in hospital room?: A Little Help needed climbing 3-5 steps with a  railing? : A Little 6 Click Score: 18    End of Session Equipment Utilized During Treatment: Gait belt Activity Tolerance: Patient tolerated treatment well Patient left: in chair;with call bell/phone within reach;with chair alarm set Nurse Communication: Mobility status PT Visit Diagnosis: Unsteadiness on feet (R26.81);Difficulty in walking, not elsewhere classified (R26.2)     Time: 8889-1694 PT Time Calculation (min) (ACUTE ONLY): 35 min  Charges:  $Gait Training: 8-22 mins $Therapeutic Exercise: 8-22 mins                     [place] Pager [phone] Office [phone]    Jehan Ranganathan 06/07/2020, 4:03 PM

## 2020-06-07 NOTE — Discharge Summary (Signed)
Patient ID: Samantha Jenkins MRN: [id number] DOB/AGE: May 13, 1941 79 y.o.  Admit date: 06/06/2020 Discharge date: 06/07/2020  Admission Diagnoses:  Principal Problem:   Right knee OA Active Problems:   Obesity (BMI 30-39.9)   Status post total right knee replacement   Hypokalemia   Discharge Diagnoses:  Same  Past Medical History:  Diagnosis Date  . Arthritis    knee  . Asthma   . BCE (basal cell epithelioma)   . Cataract   . Depression   . Dysthymia   . FHx: cardiovascular disease   . History of kidney stones   . Hyperlipidemia   . Hypertension   . LBBB (left bundle branch block)   . Obesity   . Pneumonia   . Sarcoid    pt. denies   . Vitamin D deficiency     Surgeries: Procedure(s): RIGHT TOTAL KNEE ARTHROPLASTY on 06/06/2020   Consultants: N/A  Discharged Condition: Improved  Hospital Course: Samantha Jenkins is an 79 y.o. female who was admitted 06/06/2020 for operative treatment ofOsteoarthritis of right knee. Patient has severe unremitting pain that affects sleep, daily activities, and work/hobbies. After pre-op clearance the patient was taken to the operating room on 06/06/2020 and underwent  Procedure(s):  RIGHT TOTAL KNEE ARTHROPLASTY.    Patient was given perioperative antibiotics:  Anti-infectives (From admission, onward)   Start     Dose/Rate Route Frequency Ordered Stop   06/06/20 1700  ceFAZolin (ANCEF) IVPB 2g/100 mL premix        2 g 200 mL/hr over 30 Minutes Intravenous Every 6 hours 06/06/20 1435 06/07/20 0025   06/06/20 0930  ceFAZolin (ANCEF) IVPB 2g/100 mL premix        2 g 200 mL/hr over 30 Minutes Intravenous On call to O.R. 06/06/20 3557 06/06/20 1125       Patient was given sequential compression devices, early ambulation, and chemoprophylaxis to prevent DVT.  Patient benefited maximally from hospital stay and there were no complications.    Recent vital signs:  Patient Vitals for the past 24 hrs:  BP Temp Temp src Pulse Resp SpO2 Height  Weight  06/07/20 0537 132/68 98.8 F (37.1 C) Oral 66 15 95 % -- --  06/07/20 0128 134/71 98.7 F (37.1 C) Oral 68 16 97 % -- --  06/06/20 2050 122/60 98 F (36.7 C) Oral 64 18 98 % -- --  06/06/20 1820 112/64 -- -- (!) 52 -- 99 % -- --  06/06/20 1700 124/87 97.7 F (36.5 C) Oral 70 16 97 % -- --  06/06/20 1600 123/67 97.8 F (36.6 C) -- 67 15 97 % -- --  06/06/20 1500 (!) 141/69 97.7 F (36.5 C) -- 67 12 99 % -- --  06/06/20 1445 (!) 148/85 97.6 F (36.4 C) -- 69 13 100 % -- --  06/06/20 1430 (!) 147/68 -- -- 69 12 99 % -- --  06/06/20 1415 (!) 157/76 -- -- 71 13 97 % -- --  06/06/20 1400 (!) 171/71 -- -- 69 14 98 % -- --  06/06/20 1345 (!) 170/70 97.6 F (36.4 C) -- 68 12 94 % -- --  06/06/20 1330 (!) 171/77 -- -- 68 14 100 % -- --  06/06/20 1315 (!) 168/74 -- -- 68 16 100 % -- --  06/06/20 1300 (!) 172/74 -- -- 71 18 100 % -- --  06/06/20 1252 (!) 169/65 97.6 F (36.4 C) -- 71 17 100 % -- --  06/06/20 1040 (!) 163/66 -- -- Marland Kitchen  46 17 99 % -- --  06/06/20 1030 -- -- -- (!) 47 20 98 % -- --  06/06/20 1025 (!) 165/67 -- -- (!) 47 10 98 % -- --  06/06/20 1020 -- -- -- (!) 51 13 100 % -- --  06/06/20 1019 (!) 159/67 -- -- (!) 57 12 99 % -- --  06/06/20 1018 -- -- -- (!) 53 (!) 9 98 % -- --  06/06/20 1017 -- -- -- (!) 55 17 100 % -- --  06/06/20 1016 -- -- -- (!) 55 18 99 % -- --  06/06/20 1015 -- -- -- (!) 54 -- 98 % -- --  06/06/20 1014 (!) 174/74 -- -- (!) 54 (P) 16 97 % -- --  06/06/20 0957 -- -- -- -- -- -- 5' 4.5" (1.638 m) 85.5 kg  06/06/20 0930 (!) 171/75 98.2 F (36.8 C) Oral (!) 55 18 96 % -- --     Recent laboratory studies:  Recent Labs    06/07/20 0304  WBC 9.2  HGB 11.1*  HCT 33.6*  PLT 193  NA 137  K 3.1*  CL 103  CO2 24  BUN 16  CREATININE 1.28*  GLUCOSE 134*  CALCIUM 8.8*     Discharge Medications:   Allergies as of 06/07/2020      Reactions   Statins    Muscle aches   Sulfa Antibiotics Other (See Comments)   Blood in urine       Medication List    STOP taking these medications   acetaminophen 650 MG CR tablet Commonly known as: TYLENOL   diphenhydramine-acetaminophen 25-500 MG Tabs tablet Commonly known as: TYLENOL PM   meloxicam 15 MG tablet Commonly known as: MOBIC   traMADol 50 MG tablet Commonly known as: ULTRAM     TAKE these medications   albuterol 108 (90 Base) MCG/ACT inhaler Commonly known as: VENTOLIN HFA Inhale 2 puffs into the lungs every 6 (six) hours as needed for wheezing or shortness of breath.   aspirin 81 MG chewable tablet Commonly known as: Aspirin Childrens Chew 1 tablet (81 mg total) by mouth 2 (two) times daily. Take for 4 weeks, then resume regular dose.   bismuth subsalicylate 518 MG chewable tablet Commonly known as: PEPTO BISMOL Chew 262 mg by mouth daily as needed for indigestion.   bisoprolol-hydrochlorothiazide 5-6.25 MG tablet Commonly known as: ZIAC TAKE 1 TABLET BY MOUTH DAILY. What changed: when to take this   docusate sodium 100 MG capsule Commonly known as: Colace Take 1 capsule (100 mg total) by mouth 2 (two) times daily.   ferrous sulfate 325 (65 FE) MG tablet Commonly known as: FerrouSul Take 1 tablet (325 mg total) by mouth 3 (three) times daily with meals for 14 days.   HYDROcodone-acetaminophen 5-325 MG tablet Commonly known as: Norco Take 1-2 tablets by mouth every 4 (four) hours as needed for moderate pain or severe pain.   methocarbamol 500 MG tablet Commonly known as: Robaxin Take 1 tablet (500 mg total) by mouth every 6 (six) hours as needed for muscle spasms.   neomycin-bacitracin-polymyxin ointment Commonly known as: NEOSPORIN Apply 1 application topically as needed for wound care.   polyethylene glycol 17 g packet Commonly known as: MIRALAX / GLYCOLAX Take 17 g by mouth 2 (two) times daily.   pravastatin 40 MG tablet Commonly known as: PRAVACHOL TAKE 1 TABLET EVERY DAY What changed: when to take this   sertraline 100 MG  tablet Commonly known as: ZOLOFT  TAKE 1 TABLET EVERY DAY What changed:   how much to take  when to take this   SYSTANE OP Place 1 drop into both eyes daily as needed (irritation).   Vitamin D 50 MCG (2000 UT) Caps Take 2,000 Units by mouth daily.            Discharge Care Instructions  (From admission, onward)         Start     Ordered   06/07/20 0000  Change dressing       Comments: Maintain surgical dressing until follow up in the clinic. If the edges start to pull up, may reinforce with tape. If the dressing is no longer working, may remove and cover with gauze and tape, but must keep the area dry and clean.  Call with any questions or concerns.   06/07/20 0848          Diagnostic Studies: No results found.  Disposition: Home  Discharge Instructions    Call MD / Call 911   Complete by: As directed    If you experience chest pain or shortness of breath, CALL 911 and be transported to the hospital emergency room.  If you develope a fever above 101 F, pus (white drainage) or increased drainage or redness at the wound, or calf pain, call your surgeon's office.   Change dressing   Complete by: As directed    Maintain surgical dressing until follow up in the clinic. If the edges start to pull up, may reinforce with tape. If the dressing is no longer working, may remove and cover with gauze and tape, but must keep the area dry and clean.  Call with any questions or concerns.   Constipation Prevention   Complete by: As directed    Drink plenty of fluids.  Prune juice may be helpful.  You may use a stool softener, such as Colace (over the counter) 100 mg twice a day.  Use MiraLax (over the counter) for constipation as needed.   Diet - low sodium heart healthy   Complete by: As directed    Discharge instructions   Complete by: As directed    Maintain surgical dressing until follow up in the clinic. If the edges start to pull up, may reinforce with tape. If the dressing is  no longer working, may remove and cover with gauze and tape, but must keep the area dry and clean.  Follow up in 2 weeks at [facility] Call with any questions or concerns.   Increase activity slowly as tolerated   Complete by: As directed    Weight bearing as tolerated with assist device (walker, cane, etc) as directed, use it as long as suggested by your surgeon or therapist, typically at least 4-6 weeks.   TED hose   Complete by: As directed    Use stockings (TED hose) for 2 weeks on both leg(s).  You may remove them at night for sleeping.       Follow-up Information    Paralee Cancel, MD. Schedule an appointment as soon as possible for a visit in 2 weeks.   Specialty: Orthopedic Surgery Contact information: [address] [place] [address] [phone]                Signed: Lucille Passy [facility] 06/07/2020, 8:48 AM

## 2020-06-07 NOTE — Progress Notes (Signed)
     Subjective: 1 Day Post-Op Procedure(s) (LRB): TOTAL KNEE ARTHROPLASTY (Right)   Patient reports pain as mild, pain controlled. No reported events throughout the night.  Discussed the procedure and expectations moving forward.  Ready to be discharged home, if they do well with PT.  Follow up in the clinic in 2 weeks.  Knows to call with any questions or concerns.     Patient's anticipated LOS is less than 2 midnights, meeting these requirements: - Lives within 1 hour of care - Has a competent adult at home to recover with post-op recover - NO history of  - Chronic pain requiring opiods  - Diabetes  - Coronary Artery Disease  - Heart failure  - Heart attack  - Stroke  - DVT/VTE  - Cardiac arrhythmia  - Respiratory Failure/COPD  - Renal failure  - Anemia  - Advanced Liver disease       Objective:   VITALS:   Vitals:   06/07/20 0128 06/07/20 0537  BP: 134/71 132/68  Pulse: 68 66  Resp: 16 15  Temp: 98.7 F (37.1 C) 98.8 F (37.1 C)  SpO2: 97% 95%    Dorsiflexion/Plantar flexion intact Incision: dressing C/D/I No cellulitis present Compartment soft  LABS Recent Labs    06/07/20 0304  HGB 11.1*  HCT 33.6*  WBC 9.2  PLT 193    Recent Labs    06/07/20 0304  NA 137  K 3.1*  BUN 16  CREATININE 1.28*  GLUCOSE 134*     Assessment/Plan: 1 Day Post-Op Procedure(s) (LRB): TOTAL KNEE ARTHROPLASTY (Right) Foley cath d/c'ed Advance diet Up with therapy D/C IV fluids Discharge home  Follow up in 2 weeks at [facility] Follow up with OLIN,Jonan Seufert D in 2 weeks.  Contact information:  [facility] [address], [address] [phone]    Obese (BMI 30-39.9) Estimated body mass index is 31.87 kg/m as calculated from the following:   Height as of this encounter: 5' 4.5" (1.638 m).   Weight as of this encounter: 85.5 kg. Patient also counseled that weight may inhibit the healing process Patient counseled  that losing weight will help with future health issues  Hypokalemia Treated with oral potassium and will observe           Danae Orleans PA-C  [facility]  [place] [address], [place], [place] [address] Phone: [phone] [email or web address] Facebook  [employer]

## 2020-06-07 NOTE — TOC Transition Note (Signed)
Transition of Care [facility]) - CM/SW Discharge Note   Patient Details  Name: CASSANDRE OLEKSY MRN: [id number] Date of Birth: 01/21/1941  Transition of Care [facility]) CM/SW Contact:  Lennart Pall, LCSW Phone Number: 06/07/2020, 10:28 AM   Clinical Narrative:   Met briefly with pt to confirm receipt of DME (see below).. Pt notes plan for OPPT at [facility].  No TOC needs.    Final next level of care: OP Rehab Barriers to Discharge: No Barriers Identified   Patient Goals and CMS Choice   CMS Medicare.gov Compare Post Acute Care list provided to:: Patient Choice offered to / list presented to : Patient  Discharge Placement                       Discharge Plan and Services                DME Arranged: 3-N-1, Walker rolling DME Agency: [employer] Date DME Agency Contacted: 06/07/20 Time DME Agency Contacted: [id number] Representative spoke with at DME Agency: Ovid Curd HH Arranged: NA [place] Agency: NA        Social Determinants of Health ([place]) Interventions     Readmission Risk Interventions No flowsheet data found.

## 2020-06-07 NOTE — Progress Notes (Signed)
Patient discharged to home w/ family. Given all belongings, instructions, equipment. Verbalized understanding of instructions. Escorted to pov via w/c. 

## 2020-06-07 NOTE — Plan of Care (Signed)
Plan of care reviewed and discussed with the patient. 

## 2020-06-08 DIAGNOSIS — M1711 Unilateral primary osteoarthritis, right knee: Secondary | ICD-10-CM | POA: Diagnosis not present

## 2020-06-08 DIAGNOSIS — Z96651 Presence of right artificial knee joint: Secondary | ICD-10-CM | POA: Diagnosis not present

## 2020-06-09 DIAGNOSIS — M25661 Stiffness of right knee, not elsewhere classified: Secondary | ICD-10-CM | POA: Diagnosis not present

## 2020-06-09 DIAGNOSIS — M25561 Pain in right knee: Secondary | ICD-10-CM | POA: Diagnosis not present

## 2020-06-14 DIAGNOSIS — M25561 Pain in right knee: Secondary | ICD-10-CM | POA: Diagnosis not present

## 2020-06-14 DIAGNOSIS — M25661 Stiffness of right knee, not elsewhere classified: Secondary | ICD-10-CM | POA: Diagnosis not present

## 2020-06-19 DIAGNOSIS — M25561 Pain in right knee: Secondary | ICD-10-CM | POA: Diagnosis not present

## 2020-06-19 DIAGNOSIS — M25661 Stiffness of right knee, not elsewhere classified: Secondary | ICD-10-CM | POA: Diagnosis not present

## 2020-06-22 DIAGNOSIS — M25561 Pain in right knee: Secondary | ICD-10-CM | POA: Diagnosis not present

## 2020-06-22 DIAGNOSIS — M25661 Stiffness of right knee, not elsewhere classified: Secondary | ICD-10-CM | POA: Diagnosis not present

## 2020-06-28 DIAGNOSIS — M25561 Pain in right knee: Secondary | ICD-10-CM | POA: Diagnosis not present

## 2020-06-28 DIAGNOSIS — M25661 Stiffness of right knee, not elsewhere classified: Secondary | ICD-10-CM | POA: Diagnosis not present

## 2020-06-30 DIAGNOSIS — M25661 Stiffness of right knee, not elsewhere classified: Secondary | ICD-10-CM | POA: Diagnosis not present

## 2020-06-30 DIAGNOSIS — M25561 Pain in right knee: Secondary | ICD-10-CM | POA: Diagnosis not present

## 2020-07-04 DIAGNOSIS — M25561 Pain in right knee: Secondary | ICD-10-CM | POA: Diagnosis not present

## 2020-07-04 DIAGNOSIS — M25661 Stiffness of right knee, not elsewhere classified: Secondary | ICD-10-CM | POA: Diagnosis not present

## 2020-07-07 DIAGNOSIS — M25661 Stiffness of right knee, not elsewhere classified: Secondary | ICD-10-CM | POA: Diagnosis not present

## 2020-07-07 DIAGNOSIS — M25561 Pain in right knee: Secondary | ICD-10-CM | POA: Diagnosis not present

## 2020-07-11 DIAGNOSIS — M25561 Pain in right knee: Secondary | ICD-10-CM | POA: Diagnosis not present

## 2020-07-11 DIAGNOSIS — M25661 Stiffness of right knee, not elsewhere classified: Secondary | ICD-10-CM | POA: Diagnosis not present

## 2020-07-26 DIAGNOSIS — Z471 Aftercare following joint replacement surgery: Secondary | ICD-10-CM | POA: Diagnosis not present

## 2020-07-26 DIAGNOSIS — Z96651 Presence of right artificial knee joint: Secondary | ICD-10-CM | POA: Diagnosis not present

## 2020-08-12 DIAGNOSIS — Z23 Encounter for immunization: Secondary | ICD-10-CM | POA: Diagnosis not present

## 2020-08-17 ENCOUNTER — Other Ambulatory Visit: Payer: Self-pay | Admitting: Physician Assistant

## 2020-08-17 ENCOUNTER — Ambulatory Visit
Admission: RE | Admit: 2020-08-17 | Discharge: 2020-08-17 | Disposition: A | Discharge status: Home / Self Care | Payer: PPO | Source: Ambulatory Visit | Attending: Physician Assistant | Admitting: Physician Assistant

## 2020-08-17 DIAGNOSIS — R079 Chest pain, unspecified: Secondary | ICD-10-CM | POA: Diagnosis not present

## 2020-08-17 DIAGNOSIS — R0789 Other chest pain: Secondary | ICD-10-CM

## 2020-08-17 DIAGNOSIS — G479 Sleep disorder, unspecified: Secondary | ICD-10-CM | POA: Diagnosis not present

## 2020-08-17 DIAGNOSIS — J45909 Unspecified asthma, uncomplicated: Secondary | ICD-10-CM | POA: Diagnosis not present

## 2020-09-05 DIAGNOSIS — D225 Melanocytic nevi of trunk: Secondary | ICD-10-CM | POA: Diagnosis not present

## 2020-09-05 DIAGNOSIS — L738 Other specified follicular disorders: Secondary | ICD-10-CM | POA: Diagnosis not present

## 2020-09-05 DIAGNOSIS — L821 Other seborrheic keratosis: Secondary | ICD-10-CM | POA: Diagnosis not present

## 2020-09-05 DIAGNOSIS — D692 Other nonthrombocytopenic purpura: Secondary | ICD-10-CM | POA: Diagnosis not present

## 2020-09-05 DIAGNOSIS — D1801 Hemangioma of skin and subcutaneous tissue: Secondary | ICD-10-CM | POA: Diagnosis not present

## 2021-01-03 DIAGNOSIS — N393 Stress incontinence (female) (male): Secondary | ICD-10-CM | POA: Diagnosis not present

## 2021-01-03 DIAGNOSIS — M8580 Other specified disorders of bone density and structure, unspecified site: Secondary | ICD-10-CM | POA: Diagnosis not present

## 2021-01-03 DIAGNOSIS — Z1389 Encounter for screening for other disorder: Secondary | ICD-10-CM | POA: Diagnosis not present

## 2021-01-03 DIAGNOSIS — Z0000 Encounter for general adult medical examination without abnormal findings: Secondary | ICD-10-CM | POA: Diagnosis not present

## 2021-01-03 DIAGNOSIS — N200 Calculus of kidney: Secondary | ICD-10-CM | POA: Diagnosis not present

## 2021-01-03 DIAGNOSIS — D863 Sarcoidosis of skin: Secondary | ICD-10-CM | POA: Diagnosis not present

## 2021-01-03 DIAGNOSIS — J45909 Unspecified asthma, uncomplicated: Secondary | ICD-10-CM | POA: Diagnosis not present

## 2021-01-03 DIAGNOSIS — F324 Major depressive disorder, single episode, in partial remission: Secondary | ICD-10-CM | POA: Diagnosis not present

## 2021-01-03 DIAGNOSIS — M1990 Unspecified osteoarthritis, unspecified site: Secondary | ICD-10-CM | POA: Diagnosis not present

## 2021-01-03 DIAGNOSIS — Z1211 Encounter for screening for malignant neoplasm of colon: Secondary | ICD-10-CM | POA: Diagnosis not present

## 2021-01-03 DIAGNOSIS — E7800 Pure hypercholesterolemia, unspecified: Secondary | ICD-10-CM | POA: Diagnosis not present

## 2021-01-03 DIAGNOSIS — I10 Essential (primary) hypertension: Secondary | ICD-10-CM | POA: Diagnosis not present

## 2021-01-09 DIAGNOSIS — N130 Hydronephrosis with ureteropelvic junction obstruction: Secondary | ICD-10-CM | POA: Diagnosis not present

## 2021-01-09 DIAGNOSIS — N200 Calculus of kidney: Secondary | ICD-10-CM | POA: Diagnosis not present

## 2021-01-15 DIAGNOSIS — Z96651 Presence of right artificial knee joint: Secondary | ICD-10-CM | POA: Diagnosis not present

## 2021-01-15 DIAGNOSIS — M1712 Unilateral primary osteoarthritis, left knee: Secondary | ICD-10-CM | POA: Diagnosis not present

## 2021-01-22 DIAGNOSIS — N134 Hydroureter: Secondary | ICD-10-CM | POA: Diagnosis not present

## 2021-01-22 DIAGNOSIS — N1330 Unspecified hydronephrosis: Secondary | ICD-10-CM | POA: Diagnosis not present

## 2021-01-22 DIAGNOSIS — N261 Atrophy of kidney (terminal): Secondary | ICD-10-CM | POA: Diagnosis not present

## 2021-01-22 DIAGNOSIS — N132 Hydronephrosis with renal and ureteral calculous obstruction: Secondary | ICD-10-CM | POA: Diagnosis not present

## 2021-01-22 DIAGNOSIS — K8020 Calculus of gallbladder without cholecystitis without obstruction: Secondary | ICD-10-CM | POA: Diagnosis not present

## 2021-01-24 DIAGNOSIS — Z1231 Encounter for screening mammogram for malignant neoplasm of breast: Secondary | ICD-10-CM | POA: Diagnosis not present

## 2021-01-26 ENCOUNTER — Other Ambulatory Visit: Payer: Self-pay | Admitting: Urology

## 2021-02-06 ENCOUNTER — Other Ambulatory Visit (HOSPITAL_COMMUNITY)
Admission: RE | Admit: 2021-02-06 | Discharge: 2021-02-06 | Disposition: A | Discharge status: Home / Self Care | Payer: PPO | Source: Ambulatory Visit | Attending: Urology | Admitting: Urology

## 2021-02-06 DIAGNOSIS — Z01812 Encounter for preprocedural laboratory examination: Secondary | ICD-10-CM | POA: Insufficient documentation

## 2021-02-06 DIAGNOSIS — Z20822 Contact with and (suspected) exposure to covid-19: Secondary | ICD-10-CM | POA: Insufficient documentation

## 2021-02-06 LAB — SARS CORONAVIRUS 2 (TAT 6-24 HRS): SARS Coronavirus 2: NEGATIVE

## 2021-02-07 ENCOUNTER — Other Ambulatory Visit: Payer: Self-pay

## 2021-02-07 ENCOUNTER — Encounter (HOSPITAL_BASED_OUTPATIENT_CLINIC_OR_DEPARTMENT_OTHER): Payer: Self-pay | Admitting: Urology

## 2021-02-07 NOTE — Progress Notes (Signed)
Spoke w/ via phone for pre-op interview---pt Lab needs dos----  I stat             Lab results------ekg 06-06-2020 epic COVID test ------02-06-2021 negative result epic Arrive at -------1000 NPO after MN NO Solid Food.  Clear liquids from MN until---900 then npo Med rec completed Medications to take morning of surgery -----albuterol prn/bring inhaler Diabetic medication -----n/a Patient instructed to bring photo id and insurance card day of surgery Patient aware to have Driver (ride ) / caregiver son Samantha Jenkins will stay   for 24 hours after surgery  Patient Special Instructions -----none Pre-Op special Istructions -----none Patient verbalized understanding of instructions that were given at this phone interview. Patient denies shortness of breath, chest pain, fever, cough at this phone interview.

## 2021-02-09 ENCOUNTER — Ambulatory Visit (HOSPITAL_BASED_OUTPATIENT_CLINIC_OR_DEPARTMENT_OTHER): Payer: PPO | Admitting: Anesthesiology

## 2021-02-09 ENCOUNTER — Ambulatory Visit (HOSPITAL_BASED_OUTPATIENT_CLINIC_OR_DEPARTMENT_OTHER)
Admission: RE | Admit: 2021-02-09 | Discharge: 2021-02-09 | Disposition: A | Discharge status: Home / Self Care | Payer: PPO | Attending: Urology | Admitting: Urology

## 2021-02-09 ENCOUNTER — Encounter (HOSPITAL_BASED_OUTPATIENT_CLINIC_OR_DEPARTMENT_OTHER)
Admission: RE | Disposition: A | Discharge status: Home / Self Care | Payer: Self-pay | Source: Home / Self Care | Attending: Urology

## 2021-02-09 ENCOUNTER — Other Ambulatory Visit: Payer: Self-pay

## 2021-02-09 ENCOUNTER — Encounter (HOSPITAL_BASED_OUTPATIENT_CLINIC_OR_DEPARTMENT_OTHER): Payer: Self-pay | Admitting: Urology

## 2021-02-09 DIAGNOSIS — Z882 Allergy status to sulfonamides status: Secondary | ICD-10-CM | POA: Insufficient documentation

## 2021-02-09 DIAGNOSIS — N201 Calculus of ureter: Secondary | ICD-10-CM | POA: Diagnosis not present

## 2021-02-09 DIAGNOSIS — J45909 Unspecified asthma, uncomplicated: Secondary | ICD-10-CM | POA: Diagnosis not present

## 2021-02-09 DIAGNOSIS — I10 Essential (primary) hypertension: Secondary | ICD-10-CM | POA: Diagnosis not present

## 2021-02-09 DIAGNOSIS — Z85828 Personal history of other malignant neoplasm of skin: Secondary | ICD-10-CM | POA: Insufficient documentation

## 2021-02-09 DIAGNOSIS — N200 Calculus of kidney: Secondary | ICD-10-CM

## 2021-02-09 DIAGNOSIS — E785 Hyperlipidemia, unspecified: Secondary | ICD-10-CM | POA: Diagnosis not present

## 2021-02-09 DIAGNOSIS — Z8249 Family history of ischemic heart disease and other diseases of the circulatory system: Secondary | ICD-10-CM | POA: Diagnosis not present

## 2021-02-09 DIAGNOSIS — Z87891 Personal history of nicotine dependence: Secondary | ICD-10-CM | POA: Insufficient documentation

## 2021-02-09 DIAGNOSIS — Z800 Family history of malignant neoplasm of digestive organs: Secondary | ICD-10-CM | POA: Insufficient documentation

## 2021-02-09 DIAGNOSIS — N132 Hydronephrosis with renal and ureteral calculous obstruction: Secondary | ICD-10-CM | POA: Diagnosis not present

## 2021-02-09 DIAGNOSIS — E7800 Pure hypercholesterolemia, unspecified: Secondary | ICD-10-CM | POA: Insufficient documentation

## 2021-02-09 DIAGNOSIS — N131 Hydronephrosis with ureteral stricture, not elsewhere classified: Secondary | ICD-10-CM | POA: Diagnosis not present

## 2021-02-09 DIAGNOSIS — Z90710 Acquired absence of both cervix and uterus: Secondary | ICD-10-CM | POA: Insufficient documentation

## 2021-02-09 HISTORY — PX: CYSTOSCOPY/URETEROSCOPY/HOLMIUM LASER/STENT PLACEMENT: SHX6546

## 2021-02-09 HISTORY — PX: HOLMIUM LASER APPLICATION: SHX5852

## 2021-02-09 HISTORY — DX: Personal history of other medical treatment: Z92.89

## 2021-02-09 LAB — POCT I-STAT, CHEM 8
BUN: 20 mg/dL (ref 8–23)
Calcium, Ion: 1.31 mmol/L (ref 1.15–1.40)
Chloride: 105 mmol/L (ref 98–111)
Creatinine, Ser: 1.1 mg/dL — ABNORMAL HIGH (ref 0.44–1.00)
Glucose, Bld: 107 mg/dL — ABNORMAL HIGH (ref 70–99)
HCT: 39 % (ref 36.0–46.0)
Hemoglobin: 13.3 g/dL (ref 12.0–15.0)
Potassium: 3.7 mmol/L (ref 3.5–5.1)
Sodium: 143 mmol/L (ref 135–145)
TCO2: 25 mmol/L (ref 22–32)

## 2021-02-09 SURGERY — CYSTOSCOPY/URETEROSCOPY/HOLMIUM LASER/STENT PLACEMENT
Anesthesia: General | Site: Renal | Laterality: Left

## 2021-02-09 MED ORDER — LIDOCAINE 2% (20 MG/ML) 5 ML SYRINGE
INTRAMUSCULAR | Status: DC | PRN
  Administered 2021-02-09: 80 mg via INTRAVENOUS

## 2021-02-09 MED ORDER — FENTANYL CITRATE (PF) 100 MCG/2ML IJ SOLN
INTRAMUSCULAR | Status: AC
  Filled 2021-02-09: qty 2

## 2021-02-09 MED ORDER — LIDOCAINE 2% (20 MG/ML) 5 ML SYRINGE
INTRAMUSCULAR | Status: AC
  Filled 2021-02-09: qty 5

## 2021-02-09 MED ORDER — ONDANSETRON HCL 4 MG/2ML IJ SOLN
INTRAMUSCULAR | Status: DC | PRN
  Administered 2021-02-09: 4 mg via INTRAVENOUS

## 2021-02-09 MED ORDER — ONDANSETRON HCL 4 MG/2ML IJ SOLN
INTRAMUSCULAR | Status: AC
  Filled 2021-02-09: qty 2

## 2021-02-09 MED ORDER — DEXAMETHASONE SODIUM PHOSPHATE 10 MG/ML IJ SOLN
INTRAMUSCULAR | Status: AC
  Filled 2021-02-09: qty 1

## 2021-02-09 MED ORDER — EPHEDRINE SULFATE-NACL 50-0.9 MG/10ML-% IV SOSY
PREFILLED_SYRINGE | INTRAVENOUS | Status: DC | PRN
  Administered 2021-02-09: 15 mg via INTRAVENOUS
  Administered 2021-02-09: 10 mg via INTRAVENOUS

## 2021-02-09 MED ORDER — SODIUM CHLORIDE 0.9 % IR SOLN
Status: DC | PRN
  Administered 2021-02-09: 3000 mL

## 2021-02-09 MED ORDER — FENTANYL CITRATE (PF) 100 MCG/2ML IJ SOLN
25.0000 ug | INTRAMUSCULAR | Status: DC | PRN
Start: 2021-02-09 — End: 2021-02-09

## 2021-02-09 MED ORDER — EPHEDRINE 5 MG/ML INJ
INTRAVENOUS | Status: AC
  Filled 2021-02-09: qty 10

## 2021-02-09 MED ORDER — PROPOFOL 10 MG/ML IV BOLUS
INTRAVENOUS | Status: DC | PRN
  Administered 2021-02-09: 20 mg via INTRAVENOUS
  Administered 2021-02-09: 150 mg via INTRAVENOUS
  Administered 2021-02-09: 30 mg via INTRAVENOUS

## 2021-02-09 MED ORDER — PROPOFOL 10 MG/ML IV BOLUS
INTRAVENOUS | Status: AC
  Filled 2021-02-09: qty 20

## 2021-02-09 MED ORDER — CEFAZOLIN SODIUM-DEXTROSE 2-4 GM/100ML-% IV SOLN
2.0000 g | INTRAVENOUS | Status: AC
  Administered 2021-02-09: 2 g via INTRAVENOUS

## 2021-02-09 MED ORDER — FENTANYL CITRATE (PF) 100 MCG/2ML IJ SOLN
INTRAMUSCULAR | Status: DC | PRN
  Administered 2021-02-09: 25 ug via INTRAVENOUS
  Administered 2021-02-09: 50 ug via INTRAVENOUS

## 2021-02-09 MED ORDER — IOHEXOL 300 MG/ML  SOLN
INTRAMUSCULAR | Status: DC | PRN
  Administered 2021-02-09: 20 mL

## 2021-02-09 MED ORDER — CEFAZOLIN SODIUM-DEXTROSE 2-4 GM/100ML-% IV SOLN
INTRAVENOUS | Status: AC
  Filled 2021-02-09: qty 100

## 2021-02-09 MED ORDER — LACTATED RINGERS IV SOLN: INTRAVENOUS | Status: DC

## 2021-02-09 MED ORDER — AMISULPRIDE (ANTIEMETIC) 5 MG/2ML IV SOLN: 10.0000 mg | Freq: Once | INTRAVENOUS | Status: DC | PRN

## 2021-02-09 MED ORDER — DEXAMETHASONE SODIUM PHOSPHATE 10 MG/ML IJ SOLN
INTRAMUSCULAR | Status: DC | PRN
  Administered 2021-02-09: 8 mg via INTRAVENOUS

## 2021-02-09 MED ORDER — TRAMADOL HCL 50 MG PO TABS: 50.0000 mg | ORAL_TABLET | Freq: Four times a day (QID) | ORAL | 0 refills | Status: DC | PRN

## 2021-02-09 SURGICAL SUPPLY — 23 items
BAG DRAIN URO-CYSTO SKYTR STRL (DRAIN) ×2 IMPLANT
BAG DRN UROCATH (DRAIN) ×1
BASKET LASER NITINOL 1.9FR (BASKET) IMPLANT
BSKT STON RTRVL 120 1.9FR (BASKET)
CATH URET 5FR 28IN OPEN ENDED (CATHETERS) ×2 IMPLANT
CATH URET DUAL LUMEN 6-10FR 50 (CATHETERS) ×2 IMPLANT
CLOTH BEACON ORANGE TIMEOUT ST (SAFETY) ×2 IMPLANT
EXTRACTOR STONE 1.7FRX115CM (UROLOGICAL SUPPLIES) IMPLANT
GLOVE SURG ENC MOIS LTX SZ7.5 (GLOVE) ×2 IMPLANT
GLOVE SURG UNDER POLY LF SZ7 (GLOVE) ×4 IMPLANT
GOWN STRL REUS W/TWL LRG LVL3 (GOWN DISPOSABLE) ×2 IMPLANT
GOWN STRL REUS W/TWL XL LVL3 (GOWN DISPOSABLE) ×2 IMPLANT
GUIDEWIRE STR DUAL SENSOR (WIRE) ×4 IMPLANT
IV NS IRRIG 3000ML ARTHROMATIC (IV SOLUTION) ×4 IMPLANT
KIT TURNOVER CYSTO (KITS) ×2 IMPLANT
MANIFOLD NEPTUNE II (INSTRUMENTS) ×2 IMPLANT
NS IRRIG 500ML POUR BTL (IV SOLUTION) ×2 IMPLANT
PACK CYSTO (CUSTOM PROCEDURE TRAY) ×2 IMPLANT
STENT POLARIS 5FRX24 (STENTS) ×2 IMPLANT
TRACTIP FLEXIVA PULS ID 200XHI (Laser) ×1 IMPLANT
TRACTIP FLEXIVA PULSE ID 200 (Laser) ×2
TUBE CONNECTING 12X1/4 (SUCTIONS) ×2 IMPLANT
TUBING UROLOGY SET (TUBING) ×2 IMPLANT

## 2021-02-09 NOTE — Discharge Instructions (Signed)

## 2021-02-09 NOTE — H&P (Signed)
Today the patient is here to follow-up some mild hydronephrosis. Six months ago the patient underwent a left-sided PCNL. She recovered very well from it. Her postop renal ultrasound 6 weeks later did demonstrate some mild hydronephrosis. As such, I recommended that we follow up in 6 months with a repeat ultrasound.   Patient makes calcium oxalate stones. We discussed stone prevention strategies at the last office visit.   Interval: Today the has had no ongoing voiding symptoms. She denies any hematuria. She is not having any back pain. She did have a renal ultrasound prior to her appointment today.     ALLERGIES: SULFA - "blood in the urine"    MEDICATIONS: Acetaminophen  Acetaminophen Pm  Bisoprolol-Hydrochlorothiazide 5 mg-6.25 mg tablet  Pravastatin Sodium 40 mg tablet  Sertraline Hcl 100 mg tablet  Tylenol Pm Extra Strength  Vitamin D3     GU PSH: Cysto Remove Stent FB Sim - 04/06/2020 Hysterectomy, 1981 Locm 300-399Mg /Ml Iodine,1Ml - 11/03/2019 Percut Stone Removal >2cm, Left - 03/23/2020 Ureteroscopic stone removal, Left - 03/23/2020       PSH Notes: Mohs surgery (back)   NON-GU PSH: Bilateral Tubal Ligation, 1976 Tonsillectomy.., 1945     GU PMH: Renal calculus - 11/16/2019 Microscopic hematuria - 10/26/2019    NON-GU PMH: Asthma Depression Hypercholesterolemia Hypertension Skin Cancer, History    FAMILY HISTORY: 2 sons - Son Heart Attack - Father liver cancer - Mother stroke - Father    Notes: Mother deceased at age 18; liver cancer  Father deceased at age 44; heart attack   SOCIAL HISTORY: Marital Status: Divorced Preferred Language: English; Ethnicity: Not Hispanic Or Latino; Race: White Current Smoking Status: Patient does not smoke anymore. Has not smoked since 10/18/1969. Smoked for 4 years. Smoked 1/2 pack per day.   Tobacco Use Assessment Completed: Used Tobacco in last 30 days? Does drink.  Drinks 1 caffeinated drink per day. Patient's occupation  Brewing technologist.    REVIEW OF SYSTEMS:    GU Review Female:   Patient denies frequent urination, hard to postpone urination, burning /pain with urination, get up at night to urinate, leakage of urine, stream starts and stops, trouble starting your stream, have to strain to urinate, and being pregnant.  Gastrointestinal (Upper):   Patient denies nausea, vomiting, and indigestion/ heartburn.  Gastrointestinal (Lower):   Patient denies diarrhea and constipation.  Constitutional:   Patient denies fever, night sweats, weight loss, and fatigue.  Skin:   Patient denies itching and skin rash/ lesion.  Eyes:   Patient denies blurred vision and double vision.  Ears/ Nose/ Throat:   Patient denies sore throat and sinus problems.  Hematologic/Lymphatic:   Patient denies swollen glands and easy bruising.  Cardiovascular:   Patient denies leg swelling and chest pains.  Respiratory:   Patient denies cough and shortness of breath.  Endocrine:   Patient denies excessive thirst.  Musculoskeletal:   Patient denies back pain and joint pain.  Neurological:   Patient denies headaches and dizziness.  Psychologic:   Patient denies depression and anxiety.   VITAL SIGNS:      01/09/2021 03:37 PM  Weight 187 lb / 84.82 kg  Height 65 in / 165.1 cm  BP 156/83 mmHg  Pulse 56 /min  Temperature 97.7 F / 36.5 C  BMI 31.1 kg/m   Complexity of Data:  Source Of History:  Patient  Records Review:   Previous Doctor Records, Previous Patient Records  Urine Test Review:   Urinalysis  X-Ray Review: Renal  Ultrasound: Reviewed Films. Discussed With Patient.  C.T. Abdomen/Pelvis: Reviewed Films. Discussed With Patient.     PROCEDURES:         Renal Ultrasound - 08676  Right kidney  Length: 10.8 cm Depth:5.0 cm Cortical Width1.1: cm Width: 5.6 cm  Left Kidney Length: 9.3 cm Depth: 5.1 cm Cortical Width: .6 cm Width:4.9 cm   Left Kidney/Ureter:  The left kidney is difficult to visualize well due to body habitus but  there appears to be moderate hydro and at least 2 stones. The upper pole calc measures approx. 9 mm and the lower pole calc measures 6.2 mm.  Right Kidney/Ureter:  There are at least 3 non obstructing calcs seen w largest in the lower pole at 5.1 mm.  Bladder:  PVR 17 ml      Impression: The patient has had some atrophy of left kidney in worsening of her hydronephrosis peer    ASSESSMENT:      ICD-10 Details  1 GU:   Renal calculus - N20.0   2   Hydronephrosis - N13.0      PLAN:           Orders X-Rays: C.T. Abdomen/Pelvis Without Contrast          Schedule         Document Letter(s):  Created for Patient: Clinical Summary         Notes:   The patient is doing well and having no symptoms of obstruction or pain. However, her ultrasound today demonstrates some worsening of her left-sided hydronephrosis and some atrophy of the kidney. As such, I recommended that we perform a stone protocol CT scan to ascertain whether not the patient has an obstructing stone her any identifiable etiology of her hydronephrosis. I will contact her with the results and come up with any additional treatment options at that time.

## 2021-02-09 NOTE — Op Note (Signed)
Preoperative diagnosis:  1. Left Hydro nephrosis 2. Left small proximal ureteral stone  Postoperative diagnosis:  1. Same 2. Near complete occlusion of the left ureter, may be secondary to ischemia  Procedure: 1. Cystoscopy, left retrograde pyelogram with interpretation 2. Left ureteroscopy, laser lithotripsy 3. Left Endopylotomy  Surgeon: [name], MD  Anesthesia: General  Complications: None  Intraoperative findings:  #1: Cystoscopy demonstrated normal bladder with orthotopic ureters #2: I was unable to cannulate the left ureteral orifice despite its orthotopic location and being readily visualized.  The wire would not penetrate the ureteral orifice. #3: I used a ureteroscope to cannulate the left ureteral orifice and then advanced the wire to the proximal ureter.  I then performed a retrograde pyelogram which demonstrated a very narrow lumen that blind ended at the proximal ureter. #4: I advanced the flexible ureteroscope through the ureter which was quite narrow and ischemic appearing all the way to the proximal ureter where the stone was located and where the ureteral lumen stopped. #5: After lasering the stone I attempted several passes to get the wire up into the kidney unsuccessfully.  I then lasered beyond this and did ultimately find a lumen.  I advanced the wire up into the lumen and there was a curl noted.  I then removed the ureteroscope and exchanged it for a open-ended ureteral catheter.  I injected contrast into the area where the wire was curled and it did not appear to go into the collecting system. #6: I then exchanged the open-ended catheter for the wire and again it curled in this location and appeared to be within the collecting system. #7: I placed a 5 French x24 cm Polaris stent.  EBL: Minimal  Specimens: None  Indication: RICKEY FARRIER is a 80 y.o. 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  She then represented for follow-up and was noted to have persistent left hydronephrosis.  A CT scan was performed and this demonstrated a proximal stone with associated hydronephrosis..  After reviewing the management options for treatment, he elected to proceed with the above surgical procedure(s). We have discussed the potential benefits and risks of the procedure, side effects of the proposed treatment, the likelihood of the patient achieving the goals of the procedure, and any potential problems that might occur during the procedure or recuperation. Informed consent has been obtained.  Description of procedure:  The patient was taken to the operating room and general anesthesia was induced.  The patient was placed in the dorsal lithotomy position, prepped and draped in the usual sterile fashion, and preoperative antibiotics were administered. A preoperative time-out was performed.   21 French 30 degrees cystoscope was gently passed to the patient's urethra and the bladder under visual guidance.  Cystoscopy was performed with the above findings.  I then attempted to advance the wire into the patient's left ureteral orifice unsuccessfully.  I then advanced a rigid ureteroscope through the ureteral orifice and advanced a wire through the scope.  I advanced the wire up to the proximal ureter but could not get it any further.  I then removed the scope and performed retrograde pyelogram with the above findings.  I then repassed this the ureteroscope up to the area where the wire would no longer passed.  At this point I did encounter a stone in the patient's ureter.  I lasered the stone, but there is no lumen proximal to this.  I could not advance a sensor wire into a place that appeared like the renal pelvis.  I continued to laser to open up some of the mucosa and ultimately did find an area where when I advanced the wire it curled like it was in the renal pelvis.  I then exchanged the rigid  ureteroscope for a open-ended catheter and performed retrograde pyelogram the above findings.  I debated at this point as to what the best move would be.  I did ultimately decide to place a stent because when I repassed the wire up into the open-ended catheter it appeared to be contained within something which I assumed was the collecting system.  As such, I backloaded the wire over the cystoscope and then using the Seldinger technique and advanced a 24 cm x 5 French Polaris stent.  It appeared to curl within the renal pelvis.  I then monitored the stent and old stale urine appeared to be effluxing through the stent.  The patient was subsequently extubated return the PACU in good condition.  The stent tether was removed.  [name], M.D.

## 2021-02-09 NOTE — Anesthesia Procedure Notes (Signed)
Procedure Name: LMA Insertion Date/Time: 02/09/2021 12:28 PM Performed by: Bonney Aid, CRNA Pre-anesthesia Checklist: Patient identified, Emergency Drugs available, Suction available and Patient being monitored Patient Re-evaluated:Patient Re-evaluated prior to induction Oxygen Delivery Method: Circle system utilized Preoxygenation: Pre-oxygenation with 100% oxygen Induction Type: IV induction Ventilation: Mask ventilation without difficulty LMA: LMA inserted LMA Size: 4.0 Number of attempts: 1 Airway Equipment and Method: Bite block Placement Confirmation: positive ETCO2 Tube secured with: Tape Dental Injury: Teeth and Oropharynx as per pre-operative assessment

## 2021-02-09 NOTE — Anesthesia Preprocedure Evaluation (Signed)
Anesthesia Evaluation    Reviewed: Allergy & Precautions, Patient's Chart, lab work & pertinent test results, Unable to perform ROS - Chart review only  Airway Mallampati: II  TM Distance: >3 FB Neck ROM: Full    Dental  (+) Dental Advisory Given, Teeth Intact, Chipped,    Pulmonary asthma , former smoker,    Pulmonary exam normal breath sounds clear to auscultation       Cardiovascular hypertension, Pt. on medications and Pt. on home beta blockers Normal cardiovascular exam+ dysrhythmias  Rhythm:Regular Rate:Normal  LBBB   Neuro/Psych negative neurological ROS  negative psych ROS   GI/Hepatic negative GI ROS, Neg liver ROS,   Endo/Other  negative endocrine ROS  Renal/GU Renal disease  negative genitourinary   Musculoskeletal  (+) Arthritis ,   Abdominal (+) + obese,   Peds negative pediatric ROS (+)  Hematology negative hematology ROS (+)   Anesthesia Other Findings   Reproductive/Obstetrics negative OB ROS                            Anesthesia Physical  Anesthesia Plan  ASA: III  Anesthesia Plan: General   Post-op Pain Management:    Induction: Intravenous  PONV Risk Score and Plan: 2 and Ondansetron, Dexamethasone and Treatment may vary due to age or medical condition  Airway Management Planned: LMA  Additional Equipment: None  Intra-op Plan:   Post-operative Plan: Extubation in OR  Informed Consent: I have reviewed the patients History and Physical, chart, labs and discussed the procedure including the risks, benefits and alternatives for the proposed anesthesia with the patient or authorized representative who has indicated his/her understanding and acceptance.     Dental advisory given  Plan Discussed with: CRNA  Anesthesia Plan Comments:        Anesthesia Quick Evaluation

## 2021-02-09 NOTE — Transfer of Care (Signed)
Immediate Anesthesia Transfer of Care Note  Patient: Samantha Jenkins  Procedure(s) Performed: LEFT URETEROSCOPY/ LASER LITHOTRIPSY  AND  STENT PLACEMENT,ENDOPYLOTOMY (Left Renal) HOLMIUM LASER APPLICATION (Left Renal)  Patient Location: PACU  Anesthesia Type:General  Level of Consciousness: awake, alert  and oriented  Airway & Oxygen Therapy: Patient Spontanous Breathing and Patient connected to nasal cannula oxygen  Post-op Assessment: Report given to RN  Post vital signs: Reviewed and stable  Last Vitals:  Vitals Value Taken Time  BP 159/75 02/09/21 1342  Temp    Pulse 70 02/09/21 1344  Resp 17 02/09/21 1344  SpO2 98 % 02/09/21 1344  Vitals shown include unvalidated device data.  Last Pain:  Vitals:   02/09/21 1025  TempSrc: Oral  PainSc: 0-No pain      Patients Stated Pain Goal: 6 (42/37/02 3017)  Complications: No complications documented.

## 2021-02-09 NOTE — Interval H&P Note (Signed)
History and Physical Interval Note:  02/09/2021 12:15 PM  Samantha Jenkins  has presented today for surgery, with the diagnosis of LEFT KIDNEY STONE.  The various methods of treatment have been discussed with the patient and family. After consideration of risks, benefits and other options for treatment, the patient has consented to  Procedure(s): LEFT /URETEROSCOPY/HOLMIUM LASER STONE EXTRACTION STENT PLACEMENT (Left) as a surgical intervention.  The patient's history has been reviewed, patient examined, no change in status, stable for surgery.  I have reviewed the patient's chart and labs.  Questions were answered to the patient's satisfaction.     [name]

## 2021-02-12 ENCOUNTER — Encounter (HOSPITAL_BASED_OUTPATIENT_CLINIC_OR_DEPARTMENT_OTHER): Payer: Self-pay | Admitting: Urology

## 2021-02-12 NOTE — Anesthesia Postprocedure Evaluation (Signed)
Anesthesia Post Note  Patient: Samantha Jenkins  Procedure(s) Performed: LEFT URETEROSCOPY/ LASER LITHOTRIPSY  AND  STENT PLACEMENT,ENDOPYLOTOMY (Left Renal) HOLMIUM LASER APPLICATION (Left Renal)     Patient location during evaluation: PACU Anesthesia Type: General Level of consciousness: sedated and patient cooperative Pain management: pain level controlled Vital Signs Assessment: post-procedure vital signs reviewed and stable Respiratory status: spontaneous breathing Cardiovascular status: stable Anesthetic complications: no   No complications documented.  Last Vitals:  Vitals:   02/09/21 1415 02/09/21 1510  BP: (!) 148/65 (!) 147/69  Pulse: 68 69  Resp: 14 16  Temp: 36.5 C 36.7 C  SpO2: 96% 93%    Last Pain:  Vitals:   02/09/21 1430  TempSrc:   PainSc: 0-No pain                 Nolon Nations

## 2021-02-16 DIAGNOSIS — Z961 Presence of intraocular lens: Secondary | ICD-10-CM | POA: Diagnosis not present

## 2021-02-16 DIAGNOSIS — H353131 Nonexudative age-related macular degeneration, bilateral, early dry stage: Secondary | ICD-10-CM | POA: Diagnosis not present

## 2021-02-16 DIAGNOSIS — H43813 Vitreous degeneration, bilateral: Secondary | ICD-10-CM | POA: Diagnosis not present

## 2021-02-16 DIAGNOSIS — H26493 Other secondary cataract, bilateral: Secondary | ICD-10-CM | POA: Diagnosis not present

## 2021-02-26 DIAGNOSIS — K5730 Diverticulosis of large intestine without perforation or abscess without bleeding: Secondary | ICD-10-CM | POA: Diagnosis not present

## 2021-02-26 DIAGNOSIS — K8020 Calculus of gallbladder without cholecystitis without obstruction: Secondary | ICD-10-CM | POA: Diagnosis not present

## 2021-02-26 DIAGNOSIS — N132 Hydronephrosis with renal and ureteral calculous obstruction: Secondary | ICD-10-CM | POA: Diagnosis not present

## 2021-02-26 DIAGNOSIS — M419 Scoliosis, unspecified: Secondary | ICD-10-CM | POA: Diagnosis not present

## 2021-02-26 DIAGNOSIS — N130 Hydronephrosis with ureteropelvic junction obstruction: Secondary | ICD-10-CM | POA: Diagnosis not present

## 2021-02-26 DIAGNOSIS — N200 Calculus of kidney: Secondary | ICD-10-CM | POA: Diagnosis not present

## 2021-02-28 DIAGNOSIS — R3121 Asymptomatic microscopic hematuria: Secondary | ICD-10-CM | POA: Diagnosis not present

## 2021-03-06 ENCOUNTER — Other Ambulatory Visit: Payer: Self-pay | Admitting: Urology

## 2021-03-09 NOTE — Patient Instructions (Addendum)
DUE TO COVID-19 ONLY ONE VISITOR IS ALLOWED TO COME WITH YOU AND STAY IN THE WAITING ROOM ONLY DURING PRE OP AND PROCEDURE DAY OF SURGERY. THE 1 VISITOR  MAY VISIT WITH YOU AFTER SURGERY IN YOUR PRIVATE ROOM DURING VISITING HOURS ONLY!    PLEASE BEGIN THE QUARANTINE INSTRUCTIONS AS OUTLINED IN YOUR HANDOUT.                Otilio Jefferson   Your procedure is scheduled on: 03/15/21   Report to [facility] Main  Entrance   Report to admitting at   8:50 AM     Call this number if you have problems the morning of surgery [phone]    Remember: Do not eat food or drink liquids :After Midnight.   BRUSH YOUR TEETH MORNING OF SURGERY AND RINSE YOUR MOUTH OUT, NO CHEWING GUM CANDY OR MINTS.     Take these medicines the morning of surgery with A SIP OF WATER: Use inhaler and bring it with you                                 You may not have any metal on your body including hair pins and              piercings  Do not wear jewelry, make-up, lotions, powders or perfumes, deodorant             Do not wear nail polish on your fingernails.  Do not shave  48 hours prior to surgery.     Do not bring valuables to the hospital. [place].  Contacts, dentures or bridgework may not be worn into surgery.      Patients discharged the day of surgery will not be allowed to drive home.   IF YOU ARE HAVING SURGERY AND GOING HOME THE SAME DAY, YOU MUST HAVE AN ADULT TO DRIVE YOU HOME AND BE WITH YOU FOR 24 HOURS.  YOU MAY GO HOME BY TAXI OR UBER OR ORTHERWISE, BUT AN ADULT MUST ACCOMPANY YOU HOME AND STAY WITH YOU FOR 24 HOURS.  Name and phone number of your driver:  Special Instructions: N/A              Please read over the following fact sheets you were given: _____________________________________________________________________  [facility] - Preparing for Surgery Before surgery, you can play an important role.  Because skin is not  sterile, your skin needs to be as free of germs as possible.  You can reduce the number of germs on your skin by washing with CHG (chlorahexidine gluconate) soap before surgery.  CHG is an antiseptic cleaner which kills germs and bonds with the skin to continue killing germs even after washing. Please DO NOT use if you have an allergy to CHG or antibacterial soaps.  If your skin becomes reddened/irritated stop using the CHG and inform your nurse when you arrive at Short Stay. Do not shave (including legs and underarms) for at least 48 hours prior to the first CHG shower.    Please follow these instructions carefully:  1.  Shower with CHG Soap the night before surgery and the  morning of Surgery.  2.  If you choose to wash your hair, wash your hair first as usual with your  normal  shampoo.  3.  After you shampoo, rinse your hair and body thoroughly to remove the  shampoo.                                        4.  Use CHG as you would any other liquid soap.  You can apply chg directly  to the skin and wash                       Gently with a scrungie or clean washcloth.  5.  Apply the CHG Soap to your body ONLY FROM THE NECK DOWN.   Do not use on face/ open                           Wound or open sores. Avoid contact with eyes, ears mouth and genitals (private parts).                       Wash face,  Genitals (private parts) with your normal soap.             6.  Wash thoroughly, paying special attention to the area where your surgery  will be performed.  7.  Thoroughly rinse your body with warm water from the neck down.  8.  DO NOT shower/wash with your normal soap after using and rinsing off  the CHG Soap.             9.  Pat yourself dry with a clean towel.            10.  Wear clean pajamas.            11.  Place clean sheets on your bed the night of your first shower and do not  sleep with pets. Day of Surgery : Do not apply any lotions/deodorants the morning of surgery.  Please wear clean  clothes to the hospital/surgery center.  FAILURE TO FOLLOW THESE INSTRUCTIONS MAY RESULT IN THE CANCELLATION OF YOUR SURGERY PATIENT SIGNATURE_________________________________  NURSE SIGNATURE__________________________________  ________________________________________________________________________

## 2021-03-12 ENCOUNTER — Encounter (HOSPITAL_COMMUNITY): Payer: Self-pay

## 2021-03-12 ENCOUNTER — Other Ambulatory Visit (HOSPITAL_COMMUNITY)
Admission: RE | Admit: 2021-03-12 | Discharge: 2021-03-12 | Disposition: A | Discharge status: Home / Self Care | Payer: PPO | Source: Ambulatory Visit | Attending: Urology | Admitting: Urology

## 2021-03-12 ENCOUNTER — Encounter (HOSPITAL_COMMUNITY)
Admission: RE | Admit: 2021-03-12 | Discharge: 2021-03-12 | Disposition: A | Discharge status: Home / Self Care | Payer: PPO | Source: Ambulatory Visit | Attending: Urology | Admitting: Urology

## 2021-03-12 ENCOUNTER — Other Ambulatory Visit: Payer: Self-pay

## 2021-03-12 DIAGNOSIS — Z01812 Encounter for preprocedural laboratory examination: Secondary | ICD-10-CM | POA: Insufficient documentation

## 2021-03-12 DIAGNOSIS — Z20822 Contact with and (suspected) exposure to covid-19: Secondary | ICD-10-CM | POA: Insufficient documentation

## 2021-03-12 LAB — CBC
HCT: 41.1 % (ref 36.0–46.0)
Hemoglobin: 14.1 g/dL (ref 12.0–15.0)
MCH: 32.6 pg (ref 26.0–34.0)
MCHC: 34.3 g/dL (ref 30.0–36.0)
MCV: 94.9 fL (ref 80.0–100.0)
Platelets: 238 10*3/uL (ref 150–400)
RBC: 4.33 MIL/uL (ref 3.87–5.11)
RDW: 13.2 % (ref 11.5–15.5)
WBC: 7.3 10*3/uL (ref 4.0–10.5)
nRBC: 0 % (ref 0.0–0.2)

## 2021-03-12 LAB — BASIC METABOLIC PANEL
Anion gap: 10 (ref 5–15)
BUN: 17 mg/dL (ref 8–23)
CO2: 27 mmol/L (ref 22–32)
Calcium: 9.5 mg/dL (ref 8.9–10.3)
Chloride: 106 mmol/L (ref 98–111)
Creatinine, Ser: 1.17 mg/dL — ABNORMAL HIGH (ref 0.44–1.00)
GFR, Estimated: 47 mL/min — ABNORMAL LOW (ref >60)
Glucose, Bld: 103 mg/dL — ABNORMAL HIGH (ref 70–99)
Potassium: 3.6 mmol/L (ref 3.5–5.1)
Sodium: 143 mmol/L (ref 135–145)

## 2021-03-12 NOTE — Progress Notes (Signed)
COVID Vaccine Completed:yes Date COVID Vaccine completed:01/01/20 Booster 08/28/20 COVID vaccine manufacturer: Pfizer      PCP - Dr. Kelton Pillar Cardiologist - none  Chest x-ray - no EKG - 06/06/20-epic Stress Test - no ECHO - no Cardiac Cath - no Pacemaker/ICD device last checked:NA  Sleep Study - no CPAP -   Fasting Blood Sugar - NA Checks Blood Sugar _____ times a day  Blood Thinner Instructions:NA Aspirin Instructions: Last Dose:  Anesthesia review:   Patient denies shortness of breath, fever, cough and chest pain at PAT appointment Yes. Pt reports no SOB with any activities. She climbs stairs slowly.  Patient verbalized understanding of instructions that were given to them at the PAT appointment. Patient was also instructed that they will need to review over the PAT instructions again at home before surgery.Yes

## 2021-03-13 LAB — SARS CORONAVIRUS 2 (TAT 6-24 HRS): SARS Coronavirus 2: NEGATIVE

## 2021-03-14 MED ORDER — GENTAMICIN SULFATE 40 MG/ML IJ SOLN
360.0000 mg | INTRAVENOUS | Status: AC
  Administered 2021-03-15: 360 mg via INTRAVENOUS
  Filled 2021-03-14: qty 9

## 2021-03-15 ENCOUNTER — Ambulatory Visit (HOSPITAL_COMMUNITY): Payer: PPO

## 2021-03-15 ENCOUNTER — Encounter (HOSPITAL_COMMUNITY): Payer: Self-pay | Admitting: Urology

## 2021-03-15 ENCOUNTER — Other Ambulatory Visit: Payer: Self-pay

## 2021-03-15 ENCOUNTER — Ambulatory Visit (HOSPITAL_COMMUNITY): Payer: PPO | Admitting: Registered Nurse

## 2021-03-15 ENCOUNTER — Encounter (HOSPITAL_COMMUNITY)
Admission: RE | Disposition: A | Discharge status: Home / Self Care | Payer: Self-pay | Source: Ambulatory Visit | Attending: Urology

## 2021-03-15 ENCOUNTER — Observation Stay (HOSPITAL_COMMUNITY)
Admission: RE | Admit: 2021-03-15 | Discharge: 2021-03-16 | Disposition: A | Discharge status: Home / Self Care | Payer: PPO | Source: Ambulatory Visit | Attending: Urology | Admitting: Urology

## 2021-03-15 DIAGNOSIS — Z96651 Presence of right artificial knee joint: Secondary | ICD-10-CM | POA: Diagnosis not present

## 2021-03-15 DIAGNOSIS — Z79899 Other long term (current) drug therapy: Secondary | ICD-10-CM | POA: Diagnosis not present

## 2021-03-15 DIAGNOSIS — N132 Hydronephrosis with renal and ureteral calculous obstruction: Secondary | ICD-10-CM | POA: Insufficient documentation

## 2021-03-15 DIAGNOSIS — Z87891 Personal history of nicotine dependence: Secondary | ICD-10-CM | POA: Diagnosis not present

## 2021-03-15 DIAGNOSIS — N131 Hydronephrosis with ureteral stricture, not elsewhere classified: Principal | ICD-10-CM | POA: Insufficient documentation

## 2021-03-15 DIAGNOSIS — N200 Calculus of kidney: Secondary | ICD-10-CM | POA: Diagnosis not present

## 2021-03-15 DIAGNOSIS — J45909 Unspecified asthma, uncomplicated: Secondary | ICD-10-CM | POA: Insufficient documentation

## 2021-03-15 DIAGNOSIS — E876 Hypokalemia: Secondary | ICD-10-CM | POA: Diagnosis not present

## 2021-03-15 DIAGNOSIS — N2889 Other specified disorders of kidney and ureter: Secondary | ICD-10-CM | POA: Diagnosis not present

## 2021-03-15 DIAGNOSIS — Z85828 Personal history of other malignant neoplasm of skin: Secondary | ICD-10-CM | POA: Insufficient documentation

## 2021-03-15 DIAGNOSIS — N135 Crossing vessel and stricture of ureter without hydronephrosis: Secondary | ICD-10-CM | POA: Diagnosis present

## 2021-03-15 DIAGNOSIS — I10 Essential (primary) hypertension: Secondary | ICD-10-CM | POA: Insufficient documentation

## 2021-03-15 DIAGNOSIS — I447 Left bundle-branch block, unspecified: Secondary | ICD-10-CM | POA: Diagnosis not present

## 2021-03-15 HISTORY — PX: CYSTOSCOPY/URETEROSCOPY/HOLMIUM LASER/STENT PLACEMENT: SHX6546

## 2021-03-15 SURGERY — CYSTOSCOPY/URETEROSCOPY/HOLMIUM LASER/STENT PLACEMENT
Anesthesia: General | Laterality: Left

## 2021-03-15 MED ORDER — ALBUTEROL SULFATE HFA 108 (90 BASE) MCG/ACT IN AERS: 2.0000 | INHALATION_SPRAY | Freq: Four times a day (QID) | RESPIRATORY_TRACT | Status: DC | PRN

## 2021-03-15 MED ORDER — ONDANSETRON HCL 4 MG/2ML IJ SOLN: 4.0000 mg | Freq: Once | INTRAMUSCULAR | Status: DC | PRN

## 2021-03-15 MED ORDER — ACETAMINOPHEN 10 MG/ML IV SOLN
INTRAVENOUS | Status: AC
  Filled 2021-03-15: qty 100

## 2021-03-15 MED ORDER — FENTANYL CITRATE (PF) 100 MCG/2ML IJ SOLN
INTRAMUSCULAR | Status: AC
  Filled 2021-03-15: qty 2

## 2021-03-15 MED ORDER — BISOPROLOL-HYDROCHLOROTHIAZIDE 5-6.25 MG PO TABS
1.0000 | ORAL_TABLET | Freq: Every day | ORAL | Status: DC
  Administered 2021-03-15 – 2021-03-16 (×2): 1 via ORAL
  Filled 2021-03-15 (×2): qty 1

## 2021-03-15 MED ORDER — LACTATED RINGERS IV SOLN: INTRAVENOUS | Status: DC

## 2021-03-15 MED ORDER — LIDOCAINE 2% (20 MG/ML) 5 ML SYRINGE
INTRAMUSCULAR | Status: AC
  Filled 2021-03-15: qty 5

## 2021-03-15 MED ORDER — PROPOFOL 10 MG/ML IV BOLUS
INTRAVENOUS | Status: AC
  Filled 2021-03-15: qty 20

## 2021-03-15 MED ORDER — IOHEXOL 300 MG/ML  SOLN
INTRAMUSCULAR | Status: DC | PRN
Start: 2021-03-15 — End: 2021-03-15
  Administered 2021-03-15: 5 mL

## 2021-03-15 MED ORDER — DEXAMETHASONE SODIUM PHOSPHATE 10 MG/ML IJ SOLN
INTRAMUSCULAR | Status: DC | PRN
  Administered 2021-03-15: 8 mg via INTRAVENOUS

## 2021-03-15 MED ORDER — ORAL CARE MOUTH RINSE
15.0000 mL | Freq: Once | OROMUCOSAL | Status: AC
  Administered 2021-03-15: 15 mL via OROMUCOSAL

## 2021-03-15 MED ORDER — BELLADONNA ALKALOIDS-OPIUM 16.2-30 MG RE SUPP
RECTAL | Status: AC
  Filled 2021-03-15: qty 1

## 2021-03-15 MED ORDER — EPHEDRINE SULFATE-NACL 50-0.9 MG/10ML-% IV SOSY
PREFILLED_SYRINGE | INTRAVENOUS | Status: DC | PRN
  Administered 2021-03-15: 10 mg via INTRAVENOUS

## 2021-03-15 MED ORDER — SODIUM CHLORIDE 0.9 % IR SOLN
Status: DC | PRN
  Administered 2021-03-15: 6000 mL

## 2021-03-15 MED ORDER — OXYCODONE HCL 5 MG/5ML PO SOLN: 5.0000 mg | Freq: Once | ORAL | Status: DC | PRN

## 2021-03-15 MED ORDER — HYDROMORPHONE HCL 1 MG/ML IJ SOLN
0.5000 mg | INTRAMUSCULAR | Status: DC | PRN
  Administered 2021-03-15 – 2021-03-16 (×2): 0.5 mg via INTRAVENOUS
  Filled 2021-03-15 (×3): qty 0.5

## 2021-03-15 MED ORDER — DEXAMETHASONE SODIUM PHOSPHATE 10 MG/ML IJ SOLN
INTRAMUSCULAR | Status: AC
  Filled 2021-03-15: qty 1

## 2021-03-15 MED ORDER — BELLADONNA ALKALOIDS-OPIUM 16.2-60 MG RE SUPP
RECTAL | Status: DC | PRN
  Administered 2021-03-15: 1 via RECTAL

## 2021-03-15 MED ORDER — SODIUM CHLORIDE 0.45 % IV SOLN: INTRAVENOUS | Status: DC

## 2021-03-15 MED ORDER — FENTANYL CITRATE (PF) 100 MCG/2ML IJ SOLN
INTRAMUSCULAR | Status: DC | PRN
  Administered 2021-03-15 (×2): 25 ug via INTRAVENOUS
  Administered 2021-03-15: 75 ug via INTRAVENOUS

## 2021-03-15 MED ORDER — TRAMADOL HCL 50 MG PO TABS: 50.0000 mg | ORAL_TABLET | Freq: Four times a day (QID) | ORAL | 0 refills | Status: DC | PRN

## 2021-03-15 MED ORDER — ONDANSETRON HCL 4 MG/2ML IJ SOLN
INTRAMUSCULAR | Status: DC | PRN
  Administered 2021-03-15: 4 mg via INTRAVENOUS

## 2021-03-15 MED ORDER — CEFAZOLIN SODIUM-DEXTROSE 2-4 GM/100ML-% IV SOLN
2.0000 g | INTRAVENOUS | Status: AC
  Administered 2021-03-15: 2 g via INTRAVENOUS
  Filled 2021-03-15: qty 100

## 2021-03-15 MED ORDER — EPHEDRINE 5 MG/ML INJ
INTRAVENOUS | Status: AC
  Filled 2021-03-15: qty 10

## 2021-03-15 MED ORDER — OXYCODONE HCL 5 MG PO TABS: 5.0000 mg | ORAL_TABLET | Freq: Once | ORAL | Status: DC | PRN

## 2021-03-15 MED ORDER — ACETAMINOPHEN 10 MG/ML IV SOLN
1000.0000 mg | Freq: Four times a day (QID) | INTRAVENOUS | Status: AC
  Administered 2021-03-15 – 2021-03-16 (×4): 1000 mg via INTRAVENOUS
  Filled 2021-03-15 (×3): qty 100

## 2021-03-15 MED ORDER — FENTANYL CITRATE (PF) 100 MCG/2ML IJ SOLN: 25.0000 ug | INTRAMUSCULAR | Status: DC | PRN

## 2021-03-15 MED ORDER — LIDOCAINE 2% (20 MG/ML) 5 ML SYRINGE
INTRAMUSCULAR | Status: DC | PRN
  Administered 2021-03-15: 60 mg via INTRAVENOUS

## 2021-03-15 MED ORDER — CHLORHEXIDINE GLUCONATE 0.12 % MT SOLN: 15.0000 mL | Freq: Once | OROMUCOSAL | Status: AC

## 2021-03-15 MED ORDER — PROPOFOL 10 MG/ML IV BOLUS
INTRAVENOUS | Status: DC | PRN
  Administered 2021-03-15: 60 mg via INTRAVENOUS
  Administered 2021-03-15: 25 mg via INTRAVENOUS
  Administered 2021-03-15: 110 mg via INTRAVENOUS

## 2021-03-15 MED ORDER — ONDANSETRON HCL 4 MG/2ML IJ SOLN
INTRAMUSCULAR | Status: AC
  Filled 2021-03-15: qty 2

## 2021-03-15 MED ORDER — ONDANSETRON HCL 4 MG PO TABS: 4.0000 mg | ORAL_TABLET | Freq: Three times a day (TID) | ORAL | Status: DC | PRN

## 2021-03-15 SURGICAL SUPPLY — 18 items
BAG URO CATCHER STRL LF (MISCELLANEOUS) ×2 IMPLANT
BASKET ZERO TIP NITINOL 2.4FR (BASKET) IMPLANT
CATH URET 5FR 28IN OPEN ENDED (CATHETERS) ×2 IMPLANT
CLOTH BEACON ORANGE TIMEOUT ST (SAFETY) ×2 IMPLANT
EXTRACTOR STONE 1.7FRX115CM (UROLOGICAL SUPPLIES) IMPLANT
FIBER LASER MOSES 200 DFL (Laser) ×2 IMPLANT
GLOVE SURG ENC TEXT LTX SZ7.5 (GLOVE) ×2 IMPLANT
GOWN STRL REUS W/TWL XL LVL3 (GOWN DISPOSABLE) ×2 IMPLANT
GUIDEWIRE ANG ZIPWIRE 038X150 (WIRE) IMPLANT
GUIDEWIRE STR DUAL SENSOR (WIRE) ×2 IMPLANT
KIT TURNOVER KIT A (KITS) ×2 IMPLANT
MANIFOLD NEPTUNE II (INSTRUMENTS) ×2 IMPLANT
PACK CYSTO (CUSTOM PROCEDURE TRAY) ×2 IMPLANT
SHEATH URETERAL 12FRX28CM (UROLOGICAL SUPPLIES) IMPLANT
SHEATH URETERAL 12FRX35CM (MISCELLANEOUS) ×2 IMPLANT
STENT URET 6FRX24 CONTOUR (STENTS) ×2 IMPLANT
TUBING CONNECTING 10 (TUBING) ×2 IMPLANT
TUBING UROLOGY SET (TUBING) ×2 IMPLANT

## 2021-03-15 NOTE — Op Note (Signed)
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 Same  Procedure: 1. Cystoscopy, left retrograde pyelogram with interpretation 2. Left diagnostic ureteroscopy, laser endopyelotomy 3. Left ureteral stent exchange  Surgeon: [name], MD  Anesthesia: General  Complications: None  Intraoperative findings:  #1: The patient retrograde pyelogram demonstrated a normal caliber ureter up to the UPJ, then there was a blind-end.   #2: Ureteroscopy demonstrated no clear passed to the kidney, I did perform a laser endopyelotomy on the lateral posterior and anterior aspects of the ureter which I was unsuccessful in identifying the renal pelvis.  I tried for nearly an hour to find this renal pelvis which was not readily visible with ureteroscopy. 3.:  I did decide to leave a stent in the area of manipulation around the UPJ so as to drain the fluid that had been used during the surgery and likely accumulated.  I will use this as a drain, and remove it in a week or so.  EBL: Minimal  Specimens: None  Indication: Samantha Jenkins is a 80 y.o. patient with history of left staghorn calculi.  Following the operation the patient did very well, but had persistent left hydronephrosis.  Repeat CT scan demonstrated obstructing stone.  Ureteroscopy at that point was significant for a complete ureteral occlusion from the UVJ up to the obstructing stone.  The stone was lasered, but the remainder of the renal pelvis was not identifiable at that time.  I placed a stent in the region, and then repeat CT scan demonstrated that the stent was completely outside of the collecting system.  We discussed treatment options including removing the stent and allowing that kidney atrophy versus more aggressive approach in identifying the renal pelvis and placing stent reestablishing continuity.  She opted for the latter knowing there was a risk of not being able to identify  the renal pelvis altogether.  After reviewing the management options for treatment, he elected to proceed with the above surgical procedure(s). We have discussed the potential benefits and risks of the procedure, side effects of the proposed treatment, the likelihood of the patient achieving the goals of the procedure, and any potential problems that might occur during the procedure or recuperation. Informed consent has been obtained.  Description of procedure:  The patient was taken to the operating room and general anesthesia was induced.  The patient was placed in the dorsal lithotomy position, prepped and draped in the usual sterile fashion, and preoperative antibiotics were administered. A preoperative time-out was performed.   46 French cystoscope was gently passed to the patient's reason in the bladder under visual guidance.  Stent emanating from the patient's left ureter was grasped and pulled down to the urethral meatus.  A wire was then advanced up through the stent and the stent removed over the wire.  I then exchanged the wire for a 5 [place] open-ended ureteral catheter and performed retrograde pyelogram the above findings.  I then replaced the wire and advanced a single lumen semirigid ureteroscope up into the left ureter into the UPJ.  Again the above findings were noted, no clear true lumen was identified, at this point I attempted to laser the lateral part of the UPJ which opened quickly, but outside of this area was sinus fat.  Attempted to open several other places along the proximal ureter on the lateral and posterior portions to identify the renal pelvis unsuccessfully.  This point I opted to leave a  6 x 24 cm double-J ureteral stent to drain the area that I had manipulated and irrigated.  We will plan to remove the stent in 5 days and allow the kidney to atrophy.  Patient was subsequently extubated and returned the PACU in stable condition.  [name], M.D.

## 2021-03-15 NOTE — Interval H&P Note (Signed)
History and Physical Interval Note:  03/15/2021 10:30 AM  Samantha Jenkins  has presented today for surgery, with the diagnosis of URETERAL STRICTURE.  The various methods of treatment have been discussed with the patient and family. After consideration of risks, benefits and other options for treatment, the patient has consented to  Procedure(s): LEFT URETEROSCOPY/HOLMIUM LASER ENDOPYELOTOMY STENT EXCHANGE (Left) as a surgical intervention.  The patient's history has been reviewed, patient examined, no change in status, stable for surgery.  I have reviewed the patient's chart and labs.  Questions were answered to the patient's satisfaction.     [name]

## 2021-03-15 NOTE — Anesthesia Procedure Notes (Signed)
Procedure Name: LMA Insertion Date/Time: 03/15/2021 10:53 AM Performed by: Victoriano Lain, CRNA Pre-anesthesia Checklist: Patient identified, Emergency Drugs available, Suction available, Patient being monitored and Timeout performed Patient Re-evaluated:Patient Re-evaluated prior to induction Oxygen Delivery Method: Circle system utilized Preoxygenation: Pre-oxygenation with 100% oxygen Induction Type: IV induction LMA: LMA with gastric port inserted LMA Size: 4.0 Number of attempts: 1 Placement Confirmation: positive ETCO2 and breath sounds checked- equal and bilateral Tube secured with: Tape Dental Injury: Teeth and Oropharynx as per pre-operative assessment

## 2021-03-15 NOTE — Anesthesia Postprocedure Evaluation (Signed)
Anesthesia Post Note  Patient: Otilio Jefferson  Procedure(s) Performed: CYSTOSCOPY, LEFT URETEROSCOPY, HOLMIUM LASER ENDOPYELOTOMY, URETERAL STENT PLACEMENT (Left )     Patient location during evaluation: PACU Anesthesia Type: General Level of consciousness: awake and alert and oriented Pain management: pain level controlled Vital Signs Assessment: post-procedure vital signs reviewed and stable Respiratory status: spontaneous breathing, nonlabored ventilation and respiratory function stable Cardiovascular status: blood pressure returned to baseline and stable Postop Assessment: no apparent nausea or vomiting Anesthetic complications: no   No complications documented.  Last Vitals:  Vitals:   03/15/21 1345 03/15/21 1400  BP: (!) 127/52 133/63  Pulse: 62 63  Resp: 16 17  Temp:  36.8 C  SpO2: 95% 94%    Last Pain:  Vitals:   03/15/21 1400  TempSrc:   PainSc: 2                  Damareon Lanni A.

## 2021-03-15 NOTE — H&P (Signed)
Today the patient is here to follow-up some mild hydronephrosis. Six months ago the patient underwent a left-sided PCNL. She recovered very well from it. Her postop renal ultrasound 6 weeks later did demonstrate some mild hydronephrosis. As such, I recommended that we follow up in 6 months with a repeat ultrasound.   Patient makes calcium oxalate stones. We discussed stone prevention strategies at the last office visit.   Interval: Today the has had no ongoing voiding symptoms. She denies any hematuria. She is not having any back pain. She did have a renal ultrasound prior to her appointment today.     ALLERGIES: SULFA - "blood in the urine"    MEDICATIONS: Acetaminophen  Acetaminophen Pm  Bisoprolol-Hydrochlorothiazide 5 mg-6.25 mg tablet  Pravastatin Sodium 40 mg tablet  Sertraline Hcl 100 mg tablet  Tylenol Pm Extra Strength  Vitamin D3     GU PSH: Cysto Remove Stent FB Sim - 04/06/2020 Hysterectomy, 1981 Locm 300-399Mg/Ml Iodine,1Ml - 11/03/2019 Percut Stone Removal >2cm, Left - 03/23/2020 Ureteroscopic stone removal, Left - 03/23/2020       PSH Notes: Mohs surgery (back)   NON-GU PSH: Bilateral Tubal Ligation, 1976 Tonsillectomy.., 1945     GU PMH: Renal calculus - 11/16/2019 Microscopic hematuria - 10/26/2019    NON-GU PMH: Asthma Depression Hypercholesterolemia Hypertension Skin Cancer, History    FAMILY HISTORY: 2 sons - Son Heart Attack - Father liver cancer - Mother stroke - Father    Notes: Mother deceased at age 56; liver cancer  Father deceased at age 58; heart attack   SOCIAL HISTORY: Marital Status: Divorced Preferred Language: English; Ethnicity: Not Hispanic Or Latino; Race: White Current Smoking Status: Patient does not smoke anymore. Has not smoked since 10/18/1969. Smoked for 4 years. Smoked 1/2 pack per day.   Tobacco Use Assessment Completed: Used Tobacco in last 30 days? Does drink.  Drinks 1 caffeinated drink per day. 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    REVIEW OF SYSTEMS:    GU Review Female:   Patient denies frequent urination, hard to postpone urination, burning /pain with urination, get up at night to urinate, leakage of urine, stream starts and stops, trouble starting your stream, have to strain to urinate, and being pregnant.  Gastrointestinal (Upper):   Patient denies nausea, vomiting, and indigestion/ heartburn.  Gastrointestinal (Lower):   Patient denies diarrhea and constipation.  Constitutional:   Patient denies fever, night sweats, weight loss, and fatigue.  Skin:   Patient denies itching and skin rash/ lesion.  Eyes:   Patient denies blurred vision and double vision.  Ears/ Nose/ Throat:   Patient denies sore throat and sinus problems.  Hematologic/Lymphatic:   Patient denies swollen glands and easy bruising.  Cardiovascular:   Patient denies leg swelling and chest pains.  Respiratory:   Patient denies cough and shortness of breath.  Endocrine:   Patient denies excessive thirst.  Musculoskeletal:   Patient denies back pain and joint pain.  Neurological:   Patient denies headaches and dizziness.  Psychologic:   Patient denies depression and anxiety.   VITAL SIGNS:      01/09/2021 03:37 PM  Weight 187 lb / 84.82 kg  Height 65 in / 165.1 cm  BP 156/83 mmHg  Pulse 56 /min  Temperature 97.7 F / 36.5 C  BMI 31.1 kg/m   Complexity of Data:  Source Of History:  Patient  Records Review:   Previous Doctor Records, Previous Patient Records  Urine Test Review:   Urinalysis  X-Ray Review: Renal   Ultrasound: Reviewed Films. Discussed With Patient.  C.T. Abdomen/Pelvis: Reviewed Films. Discussed With Patient.     PROCEDURES:         Renal Ultrasound - 76775  Right kidney  Length: 10.8 cm Depth:5.0 cm Cortical Width1.1: cm Width: 5.6 cm  Left Kidney Length: 9.3 cm Depth: 5.1 cm Cortical Width: .6 cm Width:4.9 cm   Left Kidney/Ureter:  The left kidney is difficult to visualize well due to body habitus but  there appears to be moderate hydro and at least 2 stones. The upper pole calc measures approx. 9 mm and the lower pole calc measures 6.2 mm.  Right Kidney/Ureter:  There are at least 3 non obstructing calcs seen w largest in the lower pole at 5.1 mm.  Bladder:  PVR 17 ml      Impression: The patient has had some atrophy of left kidney in worsening of her hydronephrosis peer    ASSESSMENT:      ICD-10 Details  1 GU:   Renal calculus - N20.0   2   Hydronephrosis - N13.0      PLAN:           Orders X-Rays: C.T. Abdomen/Pelvis Without Contrast          Schedule         Document Letter(s):  Created for Patient: Clinical Summary         Notes:   The patient is doing well and having no symptoms of obstruction or pain. However, her ultrasound today demonstrates some worsening of her left-sided hydronephrosis and some atrophy of the kidney. As such, I recommended that we perform a stone protocol CT scan to ascertain whether not the patient has an obstructing stone her any identifiable etiology of her hydronephrosis. I will contact her with the results and come up with any additional treatment options at that time.     

## 2021-03-15 NOTE — Anesthesia Preprocedure Evaluation (Addendum)
Anesthesia Evaluation  Patient identified by MRN, date of birth, ID band Patient awake    Reviewed: Allergy & Precautions, NPO status , Patient's Chart, lab work & pertinent test results, reviewed documented beta blocker date and time   Airway Mallampati: II  TM Distance: >3 FB Neck ROM: Full    Dental  (+) Poor Dentition, Chipped, Caps, Missing, Dental Advisory Given,    Pulmonary asthma , former smoker,    Pulmonary exam normal breath sounds clear to auscultation       Cardiovascular hypertension, Pt. on medications and Pt. on home beta blockers Normal cardiovascular exam+ dysrhythmias  Rhythm:Irregular Rate:Normal  EKG 06/06/20 Sinus rhythm with sinus arrhythmia, Premature atrial contractions, LBBB pattern   Neuro/Psych PSYCHIATRIC DISORDERS Depression negative neurological ROS     GI/Hepatic negative GI ROS, Neg liver ROS,   Endo/Other  Hyperlipidemia Obesity  Renal/GU Renal InsufficiencyRenal diseaseLeft ureteral stricture  Indwelling left ureteral stent Hx/o renal calculi  negative genitourinary   Musculoskeletal  (+) Arthritis , Osteoarthritis,    Abdominal (+) + obese,   Peds  Hematology negative hematology ROS (+)   Anesthesia Other Findings   Reproductive/Obstetrics                           Anesthesia Physical Anesthesia Plan  ASA: III  Anesthesia Plan: General   Post-op Pain Management:    Induction: Intravenous  PONV Risk Score and Plan: 4 or greater and Ondansetron, Treatment may vary due to age or medical condition and Dexamethasone  Airway Management Planned: LMA  Additional Equipment:   Intra-op Plan:   Post-operative Plan: Extubation in OR  Informed Consent: I have reviewed the patients History and Physical, chart, labs and discussed the procedure including the risks, benefits and alternatives for the proposed anesthesia with the patient or authorized  representative who has indicated his/her understanding and acceptance.     Dental advisory given  Plan Discussed with: Anesthesiologist and CRNA  Anesthesia Plan Comments:         Anesthesia Quick Evaluation

## 2021-03-15 NOTE — Transfer of Care (Signed)
Immediate Anesthesia Transfer of Care Note  Patient: Samantha Jenkins  Procedure(s) Performed: CYSTOSCOPY, LEFT URETEROSCOPY, HOLMIUM LASER ENDOPYELOTOMY, URETERAL STENT PLACEMENT (Left )  Patient Location: PACU  Anesthesia Type:General  Level of Consciousness: awake, alert , oriented and patient cooperative  Airway & Oxygen Therapy: Patient Spontanous Breathing and Patient connected to face mask oxygen  Post-op Assessment: Report given to RN, Post -op Vital signs reviewed and stable and Patient moving all extremities  Post vital signs: Reviewed and stable  Last Vitals:  Vitals Value Taken Time  BP 161/73 03/15/21 1236  Temp    Pulse 66 03/15/21 1238  Resp 22 03/15/21 1238  SpO2 100 % 03/15/21 1238  Vitals shown include unvalidated device data.  Last Pain:  Vitals:   03/15/21 0906  TempSrc: Oral         Complications: No complications documented.

## 2021-03-16 ENCOUNTER — Encounter (HOSPITAL_COMMUNITY): Payer: Self-pay | Admitting: Urology

## 2021-03-16 DIAGNOSIS — N131 Hydronephrosis with ureteral stricture, not elsewhere classified: Secondary | ICD-10-CM | POA: Diagnosis not present

## 2021-03-16 MED ORDER — TRAMADOL HCL 50 MG PO TABS: 50.0000 mg | ORAL_TABLET | Freq: Four times a day (QID) | ORAL | 0 refills | Status: DC | PRN

## 2021-03-16 MED ORDER — TRAMADOL HCL 50 MG PO TABS
100.0000 mg | ORAL_TABLET | Freq: Two times a day (BID) | ORAL | Status: DC | PRN
  Administered 2021-03-16: 100 mg via ORAL
  Filled 2021-03-16: qty 2

## 2021-03-16 MED ORDER — ACETAMINOPHEN 500 MG PO TABS
1000.0000 mg | ORAL_TABLET | Freq: Four times a day (QID) | ORAL | Status: DC | PRN
  Administered 2021-03-16: 1000 mg via ORAL
  Filled 2021-03-16: qty 2

## 2021-03-16 NOTE — Progress Notes (Signed)
Patient has ambulated 250 feet with use of walker. Pt is in chair. Activity tolerated well. Will continue to monitor

## 2021-03-16 NOTE — Progress Notes (Signed)
During am assessment Left arm IV appears red and edematous. Site removed. Patient continued to experience abdominal pain 7/10. Provider paged to assist with pain management.

## 2021-03-16 NOTE — Progress Notes (Signed)
No change from am assessment. Pt is alert, oriented x4 and stable out bed without assistance. Discharge instructions reviewed, questions concerns denied at this time.

## 2021-03-16 NOTE — Discharge Instructions (Signed)
DISCHARGE INSTRUCTIONS FOR KIDNEY STONE/URETERAL STENT   MEDICATIONS:  1.  Resume all your other meds from home - except do not take any extra narcotic pain meds that you may have at home.  2. Pyridium is to help with the burning/stinging when you urinate. 3. Tramadol is for moderate/severe pain, otherwise taking upto 1000 mg every 6 hours of plainTylenol will help treat your pain.   4. Take Cipro one hour prior to removal of your stent.   ACTIVITY:  1. No strenuous activity x 1week  2. No driving while on narcotic pain medications  3. Drink plenty of water  4. Continue to walk at home - you can still get blood clots when you are at home, so keep active, but don't over do it.  5. May return to work/school tomorrow or when you feel ready   BATHING:  1. You can shower and we recommend daily showers  2. You have a string coming from your urethra: The stent string is attached to your ureteral stent. Do not pull on this.   SIGNS/SYMPTOMS TO CALL:  Please call us if you have a fever greater than 101.5, uncontrolled nausea/vomiting, uncontrolled pain, dizziness, unable to urinate, bloody urine, chest pain, shortness of breath, leg swelling, leg pain, redness around wound, drainage from wound, or any other concerns or questions.   You can reach [place] at [phone].   FOLLOW-UP:  1. You have an appointment in 6 weeks with a ultrasound of your kidneys prior.   2. We will schedule a f/u appt on Monday to have your stent removed.

## 2021-03-16 NOTE — Progress Notes (Signed)
Call received from Urologist. V.O received to administer Tylenol 1000 mg once now and Tramadol 100 mg once now.  Tylenol 1000 mg  and Tramadol 100 mg PRN every 6 hours.

## 2021-03-16 NOTE — Discharge Summary (Signed)
Date of admission: 03/15/2021  Date of discharge: 03/16/2021  Admission diagnosis: left ureteral stricture  Discharge diagnosis: same  Secondary diagnoses:  Patient Active Problem List   Diagnosis Date Noted  . 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 Hypokalemia 06/07/2020  . Right knee OA 06/06/2020  . 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 Nephrolithiasis 03/23/2020  . Pain in joint of right shoulder 12/24/2017  . Arthritis of left knee 12/04/2017  . Osteoarthritis of knee 12/03/2017  . Pain in right knee 12/03/2017  . Arthritis 07/11/2015  . LBBB (left bundle branch block) 04/08/2013  . Metabolic syndrome 46/96/2952  . Hypertension 07/02/2011  . Hyperlipidemia LDL goal <100 07/02/2011  . Obesity (BMI 30-39.9) 07/02/2011  . Family history of heart disease in female family member before age 33 07/02/2011  . Dysthymia 07/02/2011    Procedures performed: Procedure(s): CYSTOSCOPY, LEFT URETEROSCOPY, HOLMIUM LASER ENDOPYELOTOMY, URETERAL STENT PLACEMENT  History and Physical: For full details, please see admission history and physical. Briefly, Samantha Jenkins is a 80 y.o. year old patient with a left ureteral stricture.   Hospital Course: Patient tolerated the procedure well.  She was then transferred to the floor after an uneventful PACU stay.  Her hospital course was uncomplicated.  On POD#1 she had met discharge criteria: was eating a regular diet, was up and ambulating independently,  pain was well controlled, was voiding without a catheter, and was ready to for discharge.  Day of discharge PE: NAD Vitals:   03/15/21 1623 03/15/21 2123 03/16/21 0510 03/16/21 1022  BP: 130/66 (!) 119/58 127/66 109/64  Pulse: 60 (!) 55 (!) 55 (!) 50  Resp: _0 Temp: 98.2 F (36.8 C) 97.6 F (36.4 C) (!) 97.5 F (36.4 C)   TempSrc: Oral Oral Oral   SpO2: 97% 97% 98% 96%    Intake/Output Summary (Last 24 hours) at 03/16/2021 1357 Last data filed at 03/16/2021 0510 Gross  per 24 hour  Intake 1115.29 ml  Output 200 ml  Net 915.29 ml   Non-labored breathing Abdomen is soft Ext symmetric   Laboratory values:  No results for input(s): WBC, HGB, HCT in the last 72 hours. No results for input(s): NA, K, CL, CO2, GLUCOSE, BUN, CREATININE, CALCIUM in the last 72 hours. No results for input(s): LABPT, INR in the last 72 hours. No results for input(s): LABURIN in the last 72 hours. Results for orders placed or performed during the hospital encounter of 03/12/21  SARS CORONAVIRUS 2 (TAT 6-24 HRS) Nasopharyngeal Nasopharyngeal Swab     Status: None   Collection Time: 03/12/21 11:23 AM   Specimen: Nasopharyngeal Swab  Result Value Ref Range Status   SARS Coronavirus 2 NEGATIVE NEGATIVE Final    Comment: (NOTE) SARS-CoV-2 target nucleic acids are NOT DETECTED.  The SARS-CoV-2 RNA is generally detectable in upper and lower respiratory specimens during the acute phase of infection. Negative results do not preclude SARS-CoV-2 infection, do not rule out co-infections with other pathogens, and should not be used as the sole basis for treatment or other patient management decisions. Negative results must be combined with clinical observations, patient history, and epidemiological information. The expected result is Negative.  Fact Sheet for Patients: SugarRoll.be  Fact Sheet for Healthcare Providers: https://www.woods-mathews.com/  This test is not yet approved or cleared by the [place] FDA and  has been authorized for detection and/or diagnosis of SARS-CoV-2 by FDA under an Emergency Use Authorization (EUA). This EUA will remain  in effect (  meaning this test can be used) for the duration of the COVID-19 declaration under Se ction 564(b)(1) of the Act, 21 U.S.C. section 360bbb-3(b)(1), unless the authorization is terminated or revoked sooner.  Performed at [facility], [place] [address] [place],  [place] [address]     Disposition: Home  Discharge instruction: The patient was instructed to be ambulatory but told to refrain from heavy lifting, strenuous activity, or driving.   Discharge medications:  Allergies as of 03/16/2021      Reactions   Statins    Muscle aches can take pravastatin   Sulfa Antibiotics Other (See Comments)   Blood in urine      Medication List    TAKE these medications   albuterol 108 (90 Base) MCG/ACT inhaler Commonly known as: VENTOLIN HFA Inhale 2 puffs into the lungs every 6 (six) hours as needed for wheezing or shortness of breath.   bisoprolol-hydrochlorothiazide 5-6.25 MG tablet Commonly known as: ZIAC TAKE 1 TABLET BY MOUTH DAILY. What changed: when to take this   pravastatin 40 MG tablet Commonly known as: PRAVACHOL TAKE 1 TABLET EVERY DAY What changed: when to take this   sertraline 100 MG tablet Commonly known as: ZOLOFT TAKE 1 TABLET EVERY DAY What changed:   how much to take  when to take this   traMADol 50 MG tablet Commonly known as: Ultram Take 1-2 tablets (50-100 mg total) by mouth every 6 (six) hours as needed for moderate pain.       Followup:   Follow-up Information    [name], MD On 03/26/2021.   Specialty: Urology Why: Stent removal Contact information: [place] [address] [phone]

## 2021-03-19 DIAGNOSIS — N200 Calculus of kidney: Secondary | ICD-10-CM | POA: Diagnosis not present

## 2021-03-26 DIAGNOSIS — N3941 Urge incontinence: Secondary | ICD-10-CM | POA: Diagnosis not present

## 2021-03-26 DIAGNOSIS — R3915 Urgency of urination: Secondary | ICD-10-CM | POA: Diagnosis not present

## 2021-03-26 DIAGNOSIS — N130 Hydronephrosis with ureteropelvic junction obstruction: Secondary | ICD-10-CM | POA: Diagnosis not present

## 2021-04-12 DIAGNOSIS — L089 Local infection of the skin and subcutaneous tissue, unspecified: Secondary | ICD-10-CM | POA: Diagnosis not present

## 2021-05-22 DIAGNOSIS — R3915 Urgency of urination: Secondary | ICD-10-CM | POA: Diagnosis not present

## 2021-06-04 DIAGNOSIS — Z23 Encounter for immunization: Secondary | ICD-10-CM | POA: Diagnosis not present

## 2021-06-13 DIAGNOSIS — M1712 Unilateral primary osteoarthritis, left knee: Secondary | ICD-10-CM | POA: Diagnosis not present

## 2021-06-13 DIAGNOSIS — Z96651 Presence of right artificial knee joint: Secondary | ICD-10-CM | POA: Diagnosis not present

## 2021-07-02 NOTE — Progress Notes (Signed)
DUE TO COVID-19 ONLY ONE VISITOR IS ALLOWED TO COME WITH YOU AND STAY IN THE WAITING ROOM ONLY DURING PRE OP AND PROCEDURE DAY OF SURGERY.  2 VISITOR  MAY VISIT WITH YOU AFTER SURGERY IN YOUR PRIVATE ROOM DURING VISITING HOURS ONLY!  YOU NEED TO HAVE A COVID 19 TEST ON__8/26/2022 ____'@_'$  '@_from'$  8am-3pm _____, THIS TEST MUST BE DONE BEFORE SURGERY,  Covid test is done at [place], [place] [address].  This is a drive thru.  No appt required. Please see map.                 Your procedure is scheduled on:  07/17/2021   Report to [facility] Main  Entrance   Report to admitting at   0730AM     Call this number if you have problems the morning of surgery [phone]    REMEMBER: NO  SOLID FOOD CANDY OR GUM AFTER MIDNIGHT. CLEAR LIQUIDS UNTIL    0700am         . NOTHING BY MOUTH EXCEPT CLEAR LIQUIDS UNTIL 0700am    . PLEASE FINISH ENSURE DRINK PER SURGEON ORDER  WHICH NEEDS TO BE COMPLETED AT    0700am   .      CLEAR LIQUID DIET   Foods Allowed                                                                    Coffee and tea, regular and decaf                            Fruit ices (not with fruit pulp)                                      Iced Popsicles                                    Carbonated beverages, regular and diet                                    Cranberry, grape and apple juices Sports drinks like Gatorade Lightly seasoned clear broth or consume(fat free) Sugar, honey syrup ___________________________________________________________________      BRUSH YOUR TEETH MORNING OF SURGERY AND RINSE YOUR MOUTH OUT, NO CHEWING GUM CANDY OR MINTS.     Take these medicines the morning of surgery with A SIP OF WATER:   amlodipine, cardura, lexapro   DO NOT TAKE ANY DIABETIC MEDICATIONS DAY OF YOUR SURGERY                               You may not have any metal on your body including hair pins and              piercings  Do not wear jewelry, make-up,  lotions, powders or perfumes, deodorant  Do not wear nail polish on your fingernails.  Do not shave  48 hours prior to surgery.              Men may shave face and neck.   Do not bring valuables to the hospital. [place].  Contacts, dentures or bridgework may not be worn into surgery.  Leave suitcase in the car. After surgery it may be brought to your room.     Patients discharged the day of surgery will not be allowed to drive home. IF YOU ARE HAVING SURGERY AND GOING HOME THE SAME DAY, YOU MUST HAVE AN ADULT TO DRIVE YOU HOME AND BE WITH YOU FOR 24 HOURS. YOU MAY GO HOME BY TAXI OR UBER OR ORTHERWISE, BUT AN ADULT MUST ACCOMPANY YOU HOME AND STAY WITH YOU FOR 24 HOURS.  Name and phone number of your driver:  Special Instructions: N/A              Please read over the following fact sheets you were given: _____________________________________________________________________  [facility] - Preparing for Surgery Before surgery, you can play an important role.  Because skin is not sterile, your skin needs to be as free of germs as possible.  You can reduce the number of germs on your skin by washing with CHG (chlorahexidine gluconate) soap before surgery.  CHG is an antiseptic cleaner which kills germs and bonds with the skin to continue killing germs even after washing. Please DO NOT use if you have an allergy to CHG or antibacterial soaps.  If your skin becomes reddened/irritated stop using the CHG and inform your nurse when you arrive at Short Stay. Do not shave (including legs and underarms) for at least 48 hours prior to the first CHG shower.  You may shave your face/neck. Please follow these instructions carefully:  1.  Shower with CHG Soap the night before surgery and the  morning of Surgery.  2.  If you choose to wash your hair, wash your hair first as usual with your  normal  shampoo.  3.  After you shampoo, rinse your hair  and body thoroughly to remove the  shampoo.                           4.  Use CHG as you would any other liquid soap.  You can apply chg directly  to the skin and wash                       Gently with a scrungie or clean washcloth.  5.  Apply the CHG Soap to your body ONLY FROM THE NECK DOWN.   Do not use on face/ open                           Wound or open sores. Avoid contact with eyes, ears mouth and genitals (private parts).                       Wash face,  Genitals (private parts) with your normal soap.             6.  Wash thoroughly, paying special attention to the area where your surgery  will be performed.  7.  Thoroughly rinse your body with  warm water from the neck down.  8.  DO NOT shower/wash with your normal soap after using and rinsing off  the CHG Soap.                9.  Pat yourself dry with a clean towel.            10.  Wear clean pajamas.            11.  Place clean sheets on your bed the night of your first shower and do not  sleep with pets. Day of Surgery : Do not apply any lotions/deodorants the morning of surgery.  Please wear clean clothes to the hospital/surgery center.  FAILURE TO FOLLOW THESE INSTRUCTIONS MAY RESULT IN THE CANCELLATION OF YOUR SURGERY PATIENT SIGNATURE_________________________________  NURSE SIGNATURE__________________________________  ________________________________________________________________________

## 2021-07-03 DIAGNOSIS — M1990 Unspecified osteoarthritis, unspecified site: Secondary | ICD-10-CM | POA: Diagnosis not present

## 2021-07-03 DIAGNOSIS — I10 Essential (primary) hypertension: Secondary | ICD-10-CM | POA: Diagnosis not present

## 2021-07-03 DIAGNOSIS — F324 Major depressive disorder, single episode, in partial remission: Secondary | ICD-10-CM | POA: Diagnosis not present

## 2021-07-03 DIAGNOSIS — E7800 Pure hypercholesterolemia, unspecified: Secondary | ICD-10-CM | POA: Diagnosis not present

## 2021-07-04 ENCOUNTER — Other Ambulatory Visit: Payer: Self-pay

## 2021-07-04 ENCOUNTER — Encounter (HOSPITAL_COMMUNITY)
Admission: RE | Admit: 2021-07-04 | Discharge: 2021-07-04 | Disposition: A | Discharge status: Home / Self Care | Payer: PPO | Source: Ambulatory Visit | Attending: Orthopedic Surgery | Admitting: Orthopedic Surgery

## 2021-07-04 ENCOUNTER — Encounter (HOSPITAL_COMMUNITY): Payer: Self-pay

## 2021-07-04 DIAGNOSIS — Z01818 Encounter for other preprocedural examination: Secondary | ICD-10-CM | POA: Insufficient documentation

## 2021-07-04 HISTORY — DX: Anxiety disorder, unspecified: F41.9

## 2021-07-04 LAB — COMPREHENSIVE METABOLIC PANEL
ALT: 15 U/L (ref 0–44)
AST: 23 U/L (ref 15–41)
Albumin: 4 g/dL (ref 3.5–5.0)
Alkaline Phosphatase: 100 U/L (ref 38–126)
Anion gap: 7 (ref 5–15)
BUN: 18 mg/dL (ref 8–23)
CO2: 24 mmol/L (ref 22–32)
Calcium: 9.5 mg/dL (ref 8.9–10.3)
Chloride: 109 mmol/L (ref 98–111)
Creatinine, Ser: 1.08 mg/dL — ABNORMAL HIGH (ref 0.44–1.00)
GFR, Estimated: 52 mL/min — ABNORMAL LOW (ref >60)
Glucose, Bld: 106 mg/dL — ABNORMAL HIGH (ref 70–99)
Potassium: 3.4 mmol/L — ABNORMAL LOW (ref 3.5–5.1)
Sodium: 140 mmol/L (ref 135–145)
Total Bilirubin: 1.2 mg/dL (ref 0.3–1.2)
Total Protein: 7.1 g/dL (ref 6.5–8.1)

## 2021-07-04 LAB — CBC
HCT: 40.4 % (ref 36.0–46.0)
Hemoglobin: 13.7 g/dL (ref 12.0–15.0)
MCH: 32.2 pg (ref 26.0–34.0)
MCHC: 33.9 g/dL (ref 30.0–36.0)
MCV: 95.1 fL (ref 80.0–100.0)
Platelets: 213 10*3/uL (ref 150–400)
RBC: 4.25 MIL/uL (ref 3.87–5.11)
RDW: 13.4 % (ref 11.5–15.5)
WBC: 6.2 10*3/uL (ref 4.0–10.5)
nRBC: 0 % (ref 0.0–0.2)

## 2021-07-04 LAB — LIPID PANEL
Cholesterol: 196 mg/dL (ref 0–200)
HDL: 52 mg/dL (ref >40)
LDL Cholesterol: 122 mg/dL — ABNORMAL HIGH (ref 0–99)
Total CHOL/HDL Ratio: 3.8 RATIO
Triglycerides: 109 mg/dL (ref <150)
VLDL: 22 mg/dL (ref 0–40)

## 2021-07-04 LAB — TYPE AND SCREEN
ABO/RH(D): O POS
Antibody Screen: NEGATIVE

## 2021-07-04 LAB — SURGICAL PCR SCREEN
MRSA, PCR: NEGATIVE
Staphylococcus aureus: NEGATIVE

## 2021-07-04 NOTE — Progress Notes (Addendum)
Anesthesia Review:  PCP: Kelton Pillar LOV 07/03/21 on chart along with clearance  Cardiologist : DR Debara Pickett- has not seen in years per pt  04/08/2013- LOV  Chest x-ray : 08/18/20  EKG : 06/06/2020- LBBB   Echo :  Stress test:  Cardiac Cath :  Activity level: can do a flight of stairs without difficulty  Sleep Study/ CPAP : none  Fasting Blood Sugar :      / Checks Blood Sugar -- times a day:   Blood Thinner/ Instructions /Last Dose: ASA / Instructions/ Last Dose :   07/13/21- Covid Test  PT with hx of multiple surgeries with noted LBBB and most recent surgery was 02/2021.  EKg was not done at preop appt of 07/04/21.  PEr Pricilla Riffle EKG can be done am of surgery.  Order in epic.

## 2021-07-13 ENCOUNTER — Other Ambulatory Visit: Payer: Self-pay | Admitting: Orthopedic Surgery

## 2021-07-14 LAB — SARS CORONAVIRUS 2 (TAT 6-24 HRS): SARS Coronavirus 2: NEGATIVE

## 2021-07-15 NOTE — H&P (Signed)
TOTAL KNEE ADMISSION H&P  Patient is being admitted for left total knee arthroplasty.  Subjective:  Chief Complaint:left knee pain.  HPI: Samantha Jenkins, 80 y.o. female, has a history of pain and functional disability in the left knee due to arthritis and has failed non-surgical conservative treatments for greater than 12 weeks to includeNSAID's and/or analgesics and activity modification.  Onset of symptoms was gradual, starting 2 years ago with gradually worsening course since that time. The patient noted no past surgery on the left knee(s).  Patient currently rates pain in the left knee(s) at 8 out of 10 with activity. Patient has worsening of pain with activity and weight bearing and pain that interferes with activities of daily living.  Patient has evidence of joint space narrowing by imaging studies.  There is no active infection.  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   Depression    Dysthymia    FHx: cardiovascular disease    History of blood transfusion as child   History of kidney stones    Hyperlipidemia    Hypertension    LBBB (left bundle branch block) 2014   on ekg   Obesity     Pneumonia    hx of as a child   Vitamin D deficiency     Past Surgical History:  Procedure Laterality Date   ABDOMINAL HYSTERECTOMY  1986   partial   APPENDECTOMY  1986   with hysterectomy   CATARACT EXTRACTION Bilateral 2016   CYSTOSCOPY/URETEROSCOPY/HOLMIUM LASER/STENT PLACEMENT Left 02/09/2021   Procedure: LEFT URETEROSCOPY/ LASER LITHOTRIPSY  AND  STENT PLACEMENT,ENDOPYLOTOMY;  Surgeon: [name], MD;  Location: [facility];  Service: Urology;  Laterality: Left;   CYSTOSCOPY/URETEROSCOPY/HOLMIUM LASER/STENT PLACEMENT Left 03/15/2021   Procedure: CYSTOSCOPY, LEFT URETEROSCOPY, HOLMIUM LASER ENDOPYELOTOMY, URETERAL STENT PLACEMENT;  Surgeon: [name], MD;  Location: [facility];  Service: Urology;  Laterality: Left;   EYE SURGERY  2016   bil cataracts   HOLMIUM LASER APPLICATION Left [id number]   Procedure: HOLMIUM LASER APPLICATION;  Surgeon: [name], MD;  [facility];  Service: Urology;  Laterality: Left;   KNEE ARTHROSCOPY  1995   left   NEPHROLITHOTOMY Left 03/23/2020   Procedure: LEFT NEPHROLITHOTOMY PERCUTANEOUS WITH SURGEON ACCESS;  Surgeon: [name], MD;  Location: [facility];  Service: Urology;  Laterality: Left;   TONSILLECTOMY  1945   adenoids removed   TOTAL KNEE ARTHROPLASTY Right 06/06/2020   Procedure: TOTAL KNEE ARTHROPLASTY;  Surgeon: Paralee Cancel, MD;  Location: [facility];  Service: Orthopedics;  Laterality: Right;  70 mins  TUBAL LIGATION  1979    No current facility-administered medications for this encounter.   Current Outpatient Medications  Medication Sig Dispense Refill Last Dose   acetaminophen (TYLENOL) 650 MG CR tablet Take 650 mg by mouth every 8 (eight) hours as needed for pain.      albuterol (PROVENTIL HFA;VENTOLIN HFA) 108 (90 BASE) MCG/ACT inhaler Inhale 2 puffs into the lungs every 6 (six) hours as needed for wheezing or shortness of breath. 1 Inhaler 0    bacitracin-polymyxin b  (POLYSPORIN) ointment Apply 1 application topically daily as needed (wound care).      Bismuth Subsalicylate (PEPTO-BISMOL) 262 MG TABS Take 262 mg by mouth 2 (two) times daily as needed (indigestion).      bisoprolol-hydrochlorothiazide (ZIAC) 5-6.25 MG tablet TAKE 1 TABLET BY MOUTH DAILY. (Patient taking differently: Take 1 tablet by mouth at bedtime.) 90 tablet 0    diphenhydramine-acetaminophen (TYLENOL PM) 25-500 MG TABS tablet Take 1-2 tablets by mouth at bedtime as needed (sleep).      Polyethyl Glycol-Propyl Glycol (SYSTANE OP) Place 1 drop into both eyes daily as needed (dry eyes).      pravastatin (PRAVACHOL) 40 MG tablet TAKE 1 TABLET EVERY DAY (Patient taking differently: Take 40 mg by mouth every evening.) 90 tablet 0    sertraline (ZOLOFT) 100 MG tablet TAKE 1 TABLET EVERY DAY (Patient taking differently: Take 150 mg by mouth at bedtime.) 90 tablet 0    sodium chloride (AYR) 0.65 % nasal spray Place 1 spray into the nose as needed for congestion.      traMADol (ULTRAM) 50 MG tablet Take 1-2 tablets (50-100 mg total) by mouth every 6 (six) hours as needed for moderate pain. (Patient not taking: Reported on 06/28/2021) 15 tablet 0 Completed Course   Allergies  Allergen Reactions   Statins     Muscle aches can take pravastatin   Sulfa Antibiotics Other (See Comments)    Blood in urine    Social History   Tobacco Use   Smoking status: Former    Packs/day: 0.50    Years: 10.00    Pack years: 5.00    Types: Cigarettes    Quit date: 07/18/1967    Years since quitting: 54.0   Smokeless tobacco: Never  Substance Use Topics   Alcohol use: Yes    Alcohol/week: 3.0 standard drinks    Types: 3 Glasses of wine per week    Comment: 1-2 glasses  weekly    Family History  Problem Relation Age of Onset   Colon cancer Neg Hx    Stomach cancer Neg Hx    Rectal cancer Neg Hx      Review of Systems  Constitutional:  Negative for chills and fever.  Respiratory:  Negative for cough and  shortness of breath.   Cardiovascular:  Negative for chest pain.  Gastrointestinal:  Negative for nausea and vomiting.  Musculoskeletal:  Positive for arthralgias.   Objective:  Physical Exam Well nourished and well developed. General: Alert and oriented x3, cooperative and pleasant, no acute distress. Head: normocephalic, atraumatic, neck supple. Eyes: EOMI.  Musculoskeletal: Left knee exam: No palpable effusion warmth erythema Valgus left knee with slight flexion contracture Flexion to 110 degrees with tightness and discomfort anterior laterally Passively correctable valgus  Calves soft and nontender. Motor function intact in LE. Strength 5/5 LE bilaterally. Neuro: Distal pulses 2+. Sensation to light touch intact in LE.  Vital signs in last 24 hours:    Labs:   Estimated  body mass index is 32.28 kg/m as calculated from the following:   Height as of 07/04/21: 5' 4.5" (1.638 m).   Weight as of 07/04/21: 86.6 kg.   Imaging Review Plain radiographs demonstrate severe degenerative joint disease of the left knee(s). The overall alignment isneutral. The bone quality appears to be adequate for age and reported activity level.      Assessment/Plan:  End stage arthritis, left knee   The patient history, physical examination, clinical judgment of the provider and imaging studies are consistent with end stage degenerative joint disease of the left knee(s) and total knee arthroplasty is deemed medically necessary. The treatment options including medical management, injection therapy arthroscopy and arthroplasty were discussed at length. The risks and benefits of total knee arthroplasty were presented and reviewed. The risks due to aseptic loosening, infection, stiffness, patella tracking problems, thromboembolic complications and other imponderables were discussed. The patient acknowledged the explanation, agreed to proceed with the plan and consent was signed. Patient is being  admitted for inpatient treatment for surgery, pain control, PT, OT, prophylactic antibiotics, VTE prophylaxis, progressive ambulation and ADL's and discharge planning. The patient is planning to be discharged  home.   Therapy Plans: outpatient therapy at [facility] Disposition: Home with son Planned DVT Prophylaxis: aspirin '81mg'$  BID DME needed: none PCP: Dr. Laurann [place], clearance received TXA: IV Allergies: sulfa drugs - hematuria , statins - muscle pain Anesthesia Concerns: none BMI: 32.8 Last HgbA1c: Not diabetic.  Other: - Hydrocodone, robaxin, meloxicam -- send ahead of time  - **Hoping to have [facility] as his physical therapist   Patient's anticipated LOS is less than 2 midnights, meeting these requirements: - Younger than 67 - Lives within 1 hour of care - Has a competent adult at home to recover with post-op recover - NO history of  - Chronic pain requiring opiods  - Diabetes  - Coronary Artery Disease  - Heart failure  - Heart attack  - Stroke  - DVT/VTE  - Cardiac arrhythmia  - Respiratory Failure/COPD  - Renal failure  - Anemia  - Advanced Liver disease  Griffith Citron, PA-C Orthopedic Surgery [facility] [place] [phone]

## 2021-07-17 ENCOUNTER — Other Ambulatory Visit: Payer: Self-pay

## 2021-07-17 ENCOUNTER — Encounter (HOSPITAL_COMMUNITY): Payer: Self-pay | Admitting: Orthopedic Surgery

## 2021-07-17 ENCOUNTER — Observation Stay (HOSPITAL_COMMUNITY)
Admission: RE | Admit: 2021-07-17 | Discharge: 2021-07-18 | Disposition: A | Discharge status: Home / Self Care | Payer: PPO | Source: Ambulatory Visit | Attending: Orthopedic Surgery | Admitting: Orthopedic Surgery

## 2021-07-17 ENCOUNTER — Ambulatory Visit (HOSPITAL_COMMUNITY): Payer: PPO | Admitting: Emergency Medicine

## 2021-07-17 ENCOUNTER — Encounter (HOSPITAL_COMMUNITY)
Admission: RE | Disposition: A | Discharge status: Home / Self Care | Payer: Self-pay | Source: Ambulatory Visit | Attending: Orthopedic Surgery

## 2021-07-17 ENCOUNTER — Ambulatory Visit (HOSPITAL_COMMUNITY): Payer: PPO | Admitting: Anesthesiology

## 2021-07-17 DIAGNOSIS — G8918 Other acute postprocedural pain: Secondary | ICD-10-CM | POA: Diagnosis not present

## 2021-07-17 DIAGNOSIS — M1712 Unilateral primary osteoarthritis, left knee: Principal | ICD-10-CM | POA: Insufficient documentation

## 2021-07-17 DIAGNOSIS — I10 Essential (primary) hypertension: Secondary | ICD-10-CM | POA: Insufficient documentation

## 2021-07-17 DIAGNOSIS — Z85828 Personal history of other malignant neoplasm of skin: Secondary | ICD-10-CM | POA: Diagnosis not present

## 2021-07-17 DIAGNOSIS — E785 Hyperlipidemia, unspecified: Secondary | ICD-10-CM | POA: Diagnosis not present

## 2021-07-17 DIAGNOSIS — J45909 Unspecified asthma, uncomplicated: Secondary | ICD-10-CM | POA: Diagnosis not present

## 2021-07-17 DIAGNOSIS — Z87891 Personal history of nicotine dependence: Secondary | ICD-10-CM | POA: Insufficient documentation

## 2021-07-17 DIAGNOSIS — Z96651 Presence of right artificial knee joint: Secondary | ICD-10-CM | POA: Insufficient documentation

## 2021-07-17 DIAGNOSIS — Z96652 Presence of left artificial knee joint: Secondary | ICD-10-CM

## 2021-07-17 DIAGNOSIS — Z79899 Other long term (current) drug therapy: Secondary | ICD-10-CM | POA: Diagnosis not present

## 2021-07-17 DIAGNOSIS — E876 Hypokalemia: Secondary | ICD-10-CM | POA: Diagnosis not present

## 2021-07-17 DIAGNOSIS — Z96659 Presence of unspecified artificial knee joint: Secondary | ICD-10-CM

## 2021-07-17 HISTORY — PX: TOTAL KNEE ARTHROPLASTY: SHX125

## 2021-07-17 SURGERY — ARTHROPLASTY, KNEE, TOTAL
Anesthesia: General | Site: Knee | Laterality: Left

## 2021-07-17 MED ORDER — BISMUTH SUBSALICYLATE 262 MG PO CHEW
262.0000 mg | CHEWABLE_TABLET | Freq: Two times a day (BID) | ORAL | Status: DC | PRN
  Filled 2021-07-17: qty 1

## 2021-07-17 MED ORDER — FENTANYL CITRATE PF 50 MCG/ML IJ SOSY
50.0000 ug | PREFILLED_SYRINGE | INTRAMUSCULAR | Status: DC
  Administered 2021-07-17: 50 ug via INTRAVENOUS
  Filled 2021-07-17: qty 2

## 2021-07-17 MED ORDER — KETOROLAC TROMETHAMINE 30 MG/ML IJ SOLN
INTRAMUSCULAR | Status: AC
  Filled 2021-07-17: qty 1

## 2021-07-17 MED ORDER — ACETAMINOPHEN 325 MG PO TABS: 325.0000 mg | ORAL_TABLET | Freq: Four times a day (QID) | ORAL | Status: DC | PRN

## 2021-07-17 MED ORDER — CHLORHEXIDINE GLUCONATE 0.12 % MT SOLN
15.0000 mL | Freq: Once | OROMUCOSAL | Status: AC
Start: 2021-07-17 — End: 2021-07-17
  Administered 2021-07-17: 15 mL via OROMUCOSAL

## 2021-07-17 MED ORDER — BISOPROLOL-HYDROCHLOROTHIAZIDE 5-6.25 MG PO TABS: 1.0000 | ORAL_TABLET | Freq: Every day | ORAL | Status: DC

## 2021-07-17 MED ORDER — DIPHENHYDRAMINE-APAP (SLEEP) 25-500 MG PO TABS: 1.0000 | ORAL_TABLET | Freq: Every evening | ORAL | Status: DC | PRN

## 2021-07-17 MED ORDER — EPHEDRINE SULFATE-NACL 50-0.9 MG/10ML-% IV SOSY
PREFILLED_SYRINGE | INTRAVENOUS | Status: DC | PRN
  Administered 2021-07-17: 10 mg via INTRAVENOUS

## 2021-07-17 MED ORDER — FENTANYL CITRATE (PF) 100 MCG/2ML IJ SOLN
INTRAMUSCULAR | Status: AC
  Filled 2021-07-17: qty 2

## 2021-07-17 MED ORDER — DEXAMETHASONE SODIUM PHOSPHATE 10 MG/ML IJ SOLN
INTRAMUSCULAR | Status: DC | PRN
  Administered 2021-07-17: 10 mg via INTRAVENOUS

## 2021-07-17 MED ORDER — LIDOCAINE 2% (20 MG/ML) 5 ML SYRINGE
INTRAMUSCULAR | Status: DC | PRN
  Administered 2021-07-17: 80 mg via INTRAVENOUS

## 2021-07-17 MED ORDER — ORAL CARE MOUTH RINSE: 15.0000 mL | Freq: Once | OROMUCOSAL | Status: AC

## 2021-07-17 MED ORDER — ONDANSETRON HCL 4 MG PO TABS: 4.0000 mg | ORAL_TABLET | Freq: Four times a day (QID) | ORAL | Status: DC | PRN

## 2021-07-17 MED ORDER — POLYVINYL ALCOHOL 1.4 % OP SOLN
1.0000 [drp] | OPHTHALMIC | Status: DC | PRN
  Filled 2021-07-17: qty 15

## 2021-07-17 MED ORDER — ONDANSETRON HCL 4 MG/2ML IJ SOLN
INTRAMUSCULAR | Status: DC | PRN
  Administered 2021-07-17: 4 mg via INTRAVENOUS

## 2021-07-17 MED ORDER — MORPHINE SULFATE (PF) 2 MG/ML IV SOLN: 0.5000 mg | INTRAVENOUS | Status: DC | PRN

## 2021-07-17 MED ORDER — METOCLOPRAMIDE HCL 5 MG/ML IJ SOLN: 5.0000 mg | Freq: Three times a day (TID) | INTRAMUSCULAR | Status: DC | PRN

## 2021-07-17 MED ORDER — DIPHENHYDRAMINE HCL 12.5 MG/5ML PO ELIX
12.5000 mg | ORAL_SOLUTION | ORAL | Status: DC | PRN
  Administered 2021-07-18: 25 mg via ORAL
  Filled 2021-07-17: qty 10

## 2021-07-17 MED ORDER — CLONIDINE HCL (ANALGESIA) 100 MCG/ML EP SOLN
EPIDURAL | Status: DC | PRN
  Administered 2021-07-17: 50 ug

## 2021-07-17 MED ORDER — BUPIVACAINE-EPINEPHRINE (PF) 0.25% -1:200000 IJ SOLN
INTRAMUSCULAR | Status: DC | PRN
  Administered 2021-07-17: 30 mL

## 2021-07-17 MED ORDER — PROPOFOL 10 MG/ML IV BOLUS
INTRAVENOUS | Status: DC | PRN
  Administered 2021-07-17: 150 mg via INTRAVENOUS

## 2021-07-17 MED ORDER — METHOCARBAMOL 500 MG IVPB - SIMPLE MED
500.0000 mg | Freq: Four times a day (QID) | INTRAVENOUS | Status: DC | PRN
  Filled 2021-07-17: qty 50

## 2021-07-17 MED ORDER — DEXAMETHASONE SODIUM PHOSPHATE 10 MG/ML IJ SOLN
INTRAMUSCULAR | Status: AC
  Filled 2021-07-17: qty 1

## 2021-07-17 MED ORDER — DOCUSATE SODIUM 100 MG PO CAPS
100.0000 mg | ORAL_CAPSULE | Freq: Two times a day (BID) | ORAL | Status: DC
  Administered 2021-07-17 – 2021-07-18 (×2): 100 mg via ORAL
  Filled 2021-07-17 (×2): qty 1

## 2021-07-17 MED ORDER — LACTATED RINGERS IV SOLN
INTRAVENOUS | Status: DC
Start: 2021-07-17 — End: 2021-07-17

## 2021-07-17 MED ORDER — ROPIVACAINE HCL 5 MG/ML IJ SOLN
INTRAMUSCULAR | Status: DC | PRN
  Administered 2021-07-17: 30 mL via PERINEURAL

## 2021-07-17 MED ORDER — CEFAZOLIN SODIUM-DEXTROSE 2-4 GM/100ML-% IV SOLN
2.0000 g | Freq: Four times a day (QID) | INTRAVENOUS | Status: AC
  Administered 2021-07-17 – 2021-07-18 (×2): 2 g via INTRAVENOUS
  Filled 2021-07-17 (×2): qty 100

## 2021-07-17 MED ORDER — ROCURONIUM BROMIDE 10 MG/ML (PF) SYRINGE
PREFILLED_SYRINGE | INTRAVENOUS | Status: AC
  Filled 2021-07-17: qty 10

## 2021-07-17 MED ORDER — PROPOFOL 1000 MG/100ML IV EMUL
INTRAVENOUS | Status: AC
  Filled 2021-07-17: qty 100

## 2021-07-17 MED ORDER — MIDAZOLAM HCL 2 MG/2ML IJ SOLN
1.0000 mg | INTRAMUSCULAR | Status: DC
  Filled 2021-07-17: qty 2

## 2021-07-17 MED ORDER — ONDANSETRON HCL 4 MG/2ML IJ SOLN: 4.0000 mg | Freq: Four times a day (QID) | INTRAMUSCULAR | Status: DC | PRN

## 2021-07-17 MED ORDER — SODIUM CHLORIDE (PF) 0.9 % IJ SOLN
INTRAMUSCULAR | Status: AC
  Filled 2021-07-17: qty 30

## 2021-07-17 MED ORDER — METHOCARBAMOL 500 MG PO TABS
500.0000 mg | ORAL_TABLET | Freq: Four times a day (QID) | ORAL | Status: DC | PRN
  Administered 2021-07-17 – 2021-07-18 (×3): 500 mg via ORAL
  Filled 2021-07-17 (×3): qty 1

## 2021-07-17 MED ORDER — FENTANYL CITRATE PF 50 MCG/ML IJ SOSY
25.0000 ug | PREFILLED_SYRINGE | INTRAMUSCULAR | Status: DC | PRN
  Administered 2021-07-17 (×2): 50 ug via INTRAVENOUS

## 2021-07-17 MED ORDER — HYDROCODONE-ACETAMINOPHEN 5-325 MG PO TABS
1.0000 | ORAL_TABLET | ORAL | Status: DC | PRN
  Administered 2021-07-17: 1 via ORAL
  Administered 2021-07-18: 2 via ORAL
  Administered 2021-07-18: 1 via ORAL
  Filled 2021-07-17 (×2): qty 1
  Filled 2021-07-17: qty 2

## 2021-07-17 MED ORDER — TRANEXAMIC ACID-NACL 1000-0.7 MG/100ML-% IV SOLN
1000.0000 mg | Freq: Once | INTRAVENOUS | Status: AC
  Administered 2021-07-17: 1000 mg via INTRAVENOUS
  Filled 2021-07-17: qty 100

## 2021-07-17 MED ORDER — MENTHOL 3 MG MT LOZG: 1.0000 | LOZENGE | OROMUCOSAL | Status: DC | PRN

## 2021-07-17 MED ORDER — PHENOL 1.4 % MT LIQD: 1.0000 | OROMUCOSAL | Status: DC | PRN

## 2021-07-17 MED ORDER — ACETAMINOPHEN 500 MG PO TABS
1000.0000 mg | ORAL_TABLET | Freq: Once | ORAL | Status: AC
  Administered 2021-07-17: 1000 mg via ORAL
  Filled 2021-07-17: qty 2

## 2021-07-17 MED ORDER — ASPIRIN 81 MG PO CHEW
81.0000 mg | CHEWABLE_TABLET | Freq: Two times a day (BID) | ORAL | Status: DC
  Administered 2021-07-17 – 2021-07-18 (×2): 81 mg via ORAL
  Filled 2021-07-17 (×2): qty 1

## 2021-07-17 MED ORDER — KETOROLAC TROMETHAMINE 30 MG/ML IJ SOLN
INTRAMUSCULAR | Status: DC | PRN
  Administered 2021-07-17: 30 mg

## 2021-07-17 MED ORDER — SODIUM CHLORIDE 0.9 % IV SOLN: INTRAVENOUS | Status: DC

## 2021-07-17 MED ORDER — FENTANYL CITRATE (PF) 100 MCG/2ML IJ SOLN
INTRAMUSCULAR | Status: DC | PRN
  Administered 2021-07-17: 100 ug via INTRAVENOUS
  Administered 2021-07-17: 50 ug via INTRAVENOUS

## 2021-07-17 MED ORDER — DEXAMETHASONE SODIUM PHOSPHATE 10 MG/ML IJ SOLN: 8.0000 mg | Freq: Once | INTRAMUSCULAR | Status: DC

## 2021-07-17 MED ORDER — ONDANSETRON HCL 4 MG/2ML IJ SOLN: 4.0000 mg | Freq: Once | INTRAMUSCULAR | Status: DC | PRN

## 2021-07-17 MED ORDER — SERTRALINE HCL 50 MG PO TABS
150.0000 mg | ORAL_TABLET | Freq: Every day | ORAL | Status: DC
  Administered 2021-07-17: 150 mg via ORAL
  Filled 2021-07-17: qty 1

## 2021-07-17 MED ORDER — SALINE NASAL SPRAY 0.65 % NA SOLN: 1.0000 | NASAL | Status: DC | PRN

## 2021-07-17 MED ORDER — CEFAZOLIN SODIUM-DEXTROSE 2-4 GM/100ML-% IV SOLN
2.0000 g | INTRAVENOUS | Status: AC
  Administered 2021-07-17: 2 g via INTRAVENOUS
  Filled 2021-07-17: qty 100

## 2021-07-17 MED ORDER — POLYETHYL GLYCOL-PROPYL GLYCOL 0.4-0.3 % OP GEL: Freq: Every day | OPHTHALMIC | Status: DC | PRN

## 2021-07-17 MED ORDER — FERROUS SULFATE 325 (65 FE) MG PO TABS
325.0000 mg | ORAL_TABLET | Freq: Three times a day (TID) | ORAL | Status: DC
  Administered 2021-07-18 (×3): 325 mg via ORAL
  Filled 2021-07-17 (×3): qty 1

## 2021-07-17 MED ORDER — BISACODYL 10 MG RE SUPP: 10.0000 mg | Freq: Every day | RECTAL | Status: DC | PRN

## 2021-07-17 MED ORDER — EPHEDRINE 5 MG/ML INJ
INTRAVENOUS | Status: AC
  Filled 2021-07-17: qty 5

## 2021-07-17 MED ORDER — SALINE SPRAY 0.65 % NA SOLN
1.0000 | NASAL | Status: DC | PRN
  Filled 2021-07-17: qty 44

## 2021-07-17 MED ORDER — POVIDONE-IODINE 10 % EX SWAB
2.0000 "application " | Freq: Once | CUTANEOUS | Status: AC
  Administered 2021-07-17: 2 via TOPICAL

## 2021-07-17 MED ORDER — TRANEXAMIC ACID-NACL 1000-0.7 MG/100ML-% IV SOLN
1000.0000 mg | INTRAVENOUS | Status: AC
  Administered 2021-07-17: 1000 mg via INTRAVENOUS
  Filled 2021-07-17: qty 100

## 2021-07-17 MED ORDER — STERILE WATER FOR IRRIGATION IR SOLN
Status: DC | PRN
  Administered 2021-07-17: 2000 mL

## 2021-07-17 MED ORDER — BUPIVACAINE-EPINEPHRINE (PF) 0.25% -1:200000 IJ SOLN
INTRAMUSCULAR | Status: AC
  Filled 2021-07-17: qty 30

## 2021-07-17 MED ORDER — METOCLOPRAMIDE HCL 5 MG PO TABS: 5.0000 mg | ORAL_TABLET | Freq: Three times a day (TID) | ORAL | Status: DC | PRN

## 2021-07-17 MED ORDER — LACTATED RINGERS IV SOLN: INTRAVENOUS | Status: DC

## 2021-07-17 MED ORDER — SUGAMMADEX SODIUM 200 MG/2ML IV SOLN
INTRAVENOUS | Status: DC | PRN
  Administered 2021-07-17: 200 mg via INTRAVENOUS

## 2021-07-17 MED ORDER — ROCURONIUM BROMIDE 10 MG/ML (PF) SYRINGE
PREFILLED_SYRINGE | INTRAVENOUS | Status: DC | PRN
  Administered 2021-07-17: 60 mg via INTRAVENOUS

## 2021-07-17 MED ORDER — DEXAMETHASONE SODIUM PHOSPHATE 10 MG/ML IJ SOLN
10.0000 mg | Freq: Once | INTRAMUSCULAR | Status: AC
  Administered 2021-07-18: 10 mg via INTRAVENOUS
  Filled 2021-07-17: qty 1

## 2021-07-17 MED ORDER — SODIUM CHLORIDE (PF) 0.9 % IJ SOLN
INTRAMUSCULAR | Status: DC | PRN
  Administered 2021-07-17: 30 mL

## 2021-07-17 MED ORDER — ALBUTEROL SULFATE (2.5 MG/3ML) 0.083% IN NEBU: 3.0000 mL | INHALATION_SOLUTION | Freq: Four times a day (QID) | RESPIRATORY_TRACT | Status: DC | PRN

## 2021-07-17 MED ORDER — GLYCOPYRROLATE 0.2 MG/ML IJ SOLN
INTRAMUSCULAR | Status: DC | PRN
  Administered 2021-07-17: .2 mg via INTRAVENOUS

## 2021-07-17 MED ORDER — PRAVASTATIN SODIUM 20 MG PO TABS: 40.0000 mg | ORAL_TABLET | Freq: Every evening | ORAL | Status: DC

## 2021-07-17 MED ORDER — PHENYLEPHRINE 40 MCG/ML (10ML) SYRINGE FOR IV PUSH (FOR BLOOD PRESSURE SUPPORT)
PREFILLED_SYRINGE | INTRAVENOUS | Status: AC
  Filled 2021-07-17: qty 10

## 2021-07-17 MED ORDER — FENTANYL CITRATE PF 50 MCG/ML IJ SOSY
PREFILLED_SYRINGE | INTRAMUSCULAR | Status: AC
  Filled 2021-07-17: qty 3

## 2021-07-17 MED ORDER — POLYETHYLENE GLYCOL 3350 17 G PO PACK: 17.0000 g | PACK | Freq: Every day | ORAL | Status: DC | PRN

## 2021-07-17 MED ORDER — HYDROCODONE-ACETAMINOPHEN 7.5-325 MG PO TABS
1.0000 | ORAL_TABLET | ORAL | Status: DC | PRN
  Administered 2021-07-18: 2 via ORAL
  Filled 2021-07-17: qty 2

## 2021-07-17 MED ORDER — DIPHENHYDRAMINE HCL 25 MG PO CAPS: 25.0000 mg | ORAL_CAPSULE | Freq: Every evening | ORAL | Status: DC | PRN

## 2021-07-17 MED ORDER — METHOCARBAMOL 500 MG IVPB - SIMPLE MED
INTRAVENOUS | Status: AC
  Administered 2021-07-17: 500 mg
  Filled 2021-07-17: qty 50

## 2021-07-17 SURGICAL SUPPLY — 55 items
ATTUNE MED ANAT PAT 35 KNEE (Knees) ×2 IMPLANT
ATTUNE MED ANAT PAT 35MM KNEE (Knees) ×1 IMPLANT
ATTUNE PSFEM LTSZ5 NARCEM KNEE (Femur) ×3 IMPLANT
ATTUNE PSRP INSR SZ5 5 KNEE (Insert) ×2 IMPLANT
ATTUNE PSRP INSR SZ5 5MM KNEE (Insert) ×1 IMPLANT
BAG COUNTER SPONGE SURGICOUNT (BAG) IMPLANT
BAG SURGICOUNT SPONGE COUNTING (BAG)
BAG ZIPLOCK 12X15 (MISCELLANEOUS) IMPLANT
BASEPLATE TIBIAL ROTATING SZ 4 (Knees) ×3 IMPLANT
BLADE SAW SGTL 11.0X1.19X90.0M (BLADE) IMPLANT
BLADE SAW SGTL 13.0X1.19X90.0M (BLADE) ×3 IMPLANT
BLADE SURG SZ10 CARB STEEL (BLADE) ×6 IMPLANT
BNDG ELASTIC 6X5.8 VLCR STR LF (GAUZE/BANDAGES/DRESSINGS) ×3 IMPLANT
BOWL SMART MIX CTS (DISPOSABLE) ×3 IMPLANT
CEMENT HV SMART SET (Cement) ×6 IMPLANT
CUFF TOURN SGL QUICK 34 (TOURNIQUET CUFF) ×2
CUFF TRNQT CYL 34X4.125X (TOURNIQUET CUFF) ×1 IMPLANT
DECANTER SPIKE VIAL GLASS SM (MISCELLANEOUS) ×6 IMPLANT
DERMABOND ADVANCED (GAUZE/BANDAGES/DRESSINGS) ×2
DERMABOND ADVANCED .7 DNX12 (GAUZE/BANDAGES/DRESSINGS) ×1 IMPLANT
DRAPE U-SHAPE 47X51 STRL (DRAPES) ×3 IMPLANT
DRESSING AQUACEL AG SP 3.5X10 (GAUZE/BANDAGES/DRESSINGS) ×1 IMPLANT
DRSG AQUACEL AG ADV 3.5X10 (GAUZE/BANDAGES/DRESSINGS) ×3 IMPLANT
DRSG AQUACEL AG SP 3.5X10 (GAUZE/BANDAGES/DRESSINGS) ×3
DURAPREP 26ML APPLICATOR (WOUND CARE) ×6 IMPLANT
ELECT REM PT RETURN 15FT ADLT (MISCELLANEOUS) ×3 IMPLANT
GLOVE SURG ENC MOIS LTX SZ6 (GLOVE) IMPLANT
GLOVE SURG UNDER LTX SZ7.5 (GLOVE) ×3 IMPLANT
GLOVE SURG UNDER POLY LF SZ6.5 (GLOVE) IMPLANT
GLOVE SURG UNDER POLY LF SZ7.5 (GLOVE) ×3 IMPLANT
GOWN STRL REUS W/TWL LRG LVL3 (GOWN DISPOSABLE) ×3 IMPLANT
HANDPIECE INTERPULSE COAX TIP (DISPOSABLE) ×2
HOLDER FOLEY CATH W/STRAP (MISCELLANEOUS) IMPLANT
KIT TURNOVER KIT A (KITS) ×3 IMPLANT
MANIFOLD NEPTUNE II (INSTRUMENTS) ×3 IMPLANT
NDL SAFETY ECLIPSE 18X1.5 (NEEDLE) IMPLANT
NEEDLE HYPO 18GX1.5 SHARP (NEEDLE)
NS IRRIG 1000ML POUR BTL (IV SOLUTION) ×3 IMPLANT
PACK TOTAL KNEE CUSTOM (KITS) ×3 IMPLANT
PENCIL SMOKE EVACUATOR (MISCELLANEOUS) IMPLANT
PIN DRILL FIX HALF THREAD (BIT) ×3 IMPLANT
PIN FIX SIGMA LCS THRD HI (PIN) ×3 IMPLANT
PROTECTOR NERVE ULNAR (MISCELLANEOUS) ×3 IMPLANT
SET HNDPC FAN SPRY TIP SCT (DISPOSABLE) ×1 IMPLANT
SET PAD KNEE POSITIONER (MISCELLANEOUS) ×3 IMPLANT
SUT MNCRL AB 4-0 PS2 18 (SUTURE) ×3 IMPLANT
SUT STRATAFIX PDS+ 0 24IN (SUTURE) ×3 IMPLANT
SUT VIC AB 1 CT1 36 (SUTURE) ×3 IMPLANT
SUT VIC AB 2-0 CT1 27 (SUTURE) ×6
SUT VIC AB 2-0 CT1 TAPERPNT 27 (SUTURE) ×3 IMPLANT
SYR 3ML LL SCALE MARK (SYRINGE) ×3 IMPLANT
TRAY FOLEY MTR SLVR 16FR STAT (SET/KITS/TRAYS/PACK) ×3 IMPLANT
TUBE SUCTION HIGH CAP CLEAR NV (SUCTIONS) ×3 IMPLANT
WATER STERILE IRR 1000ML POUR (IV SOLUTION) ×6 IMPLANT
WRAP KNEE MAXI GEL POST OP (GAUZE/BANDAGES/DRESSINGS) ×3 IMPLANT

## 2021-07-17 NOTE — Anesthesia Preprocedure Evaluation (Addendum)
Anesthesia Evaluation  Patient identified by MRN, date of birth, ID band Patient awake    Reviewed: Allergy & Precautions, NPO status , Patient's Chart, lab work & pertinent test results, reviewed documented beta blocker date and time   Airway Mallampati: II  TM Distance: >3 FB Neck ROM: Full    Dental  (+) Teeth Intact, Dental Advisory Given   Pulmonary asthma , former smoker,    Pulmonary exam normal breath sounds clear to auscultation       Cardiovascular hypertension, Pt. on home beta blockers and Pt. on medications Normal cardiovascular exam+ dysrhythmias (LBBB)  Rhythm:Regular Rate:Normal     Neuro/Psych PSYCHIATRIC DISORDERS Anxiety Depression negative neurological ROS     GI/Hepatic negative GI ROS, Neg liver ROS,   Endo/Other  Obesity   Renal/GU negative Renal ROS     Musculoskeletal  (+) Arthritis ,   Abdominal   Peds  Hematology negative hematology ROS (+)   Anesthesia Other Findings Day of surgery medications reviewed with the patient.  Reproductive/Obstetrics                            Anesthesia Physical Anesthesia Plan  ASA: 3  Anesthesia Plan: General   Post-op Pain Management:  Regional for Post-op pain   Induction: Intravenous  PONV Risk Score and Plan: 3 and Dexamethasone and Ondansetron  Airway Management Planned: Oral ETT  Additional Equipment:   Intra-op Plan:   Post-operative Plan: Extubation in OR  Informed Consent: I have reviewed the patients History and Physical, chart, labs and discussed the procedure including the risks, benefits and alternatives for the proposed anesthesia with the patient or authorized representative who has indicated his/her understanding and acceptance.     Dental advisory given  Plan Discussed with: CRNA, Anesthesiologist and Surgeon  Anesthesia Plan Comments:        Anesthesia Quick Evaluation

## 2021-07-17 NOTE — Anesthesia Postprocedure Evaluation (Signed)
Anesthesia Post Note  Patient: Samantha Jenkins  Procedure(s) Performed: TOTAL KNEE ARTHROPLASTY (Left: Knee)     Patient location during evaluation: PACU Anesthesia Type: General Level of consciousness: awake and alert Pain management: pain level controlled Vital Signs Assessment: post-procedure vital signs reviewed and stable Respiratory status: spontaneous breathing, nonlabored ventilation, respiratory function stable and patient connected to nasal cannula oxygen Cardiovascular status: blood pressure returned to baseline and stable Postop Assessment: no apparent nausea or vomiting Anesthetic complications: no   No notable events documented.  Last Vitals:  Vitals:   07/17/21 1400 07/17/21 1500  BP: 122/60 128/60  Pulse: 60 (!) 58  Resp: 13 14  Temp: 36.4 C   SpO2: 95% 96%    Last Pain:  Vitals:   07/17/21 1500  TempSrc:   PainSc: Asleep                 Catalina Gravel

## 2021-07-17 NOTE — Anesthesia Procedure Notes (Signed)
Procedure Name: Intubation Date/Time: 07/17/2021 9:53 AM Performed by: Myna Bright, CRNA Pre-anesthesia Checklist: Emergency Drugs available, Suction available, Patient identified and Patient being monitored Patient Re-evaluated:Patient Re-evaluated prior to induction Oxygen Delivery Method: Circle system utilized Preoxygenation: Pre-oxygenation with 100% oxygen Induction Type: IV induction Ventilation: Mask ventilation without difficulty Laryngoscope Size: Mac and 3 Grade View: Grade II Tube type: Oral Tube size: 7.0 mm Number of attempts: 1 Airway Equipment and Method: Stylet Placement Confirmation: ETT inserted through vocal cords under direct vision, positive ETCO2 and breath sounds checked- equal and bilateral Secured at: 21 cm Tube secured with: Tape Dental Injury: Teeth and Oropharynx as per pre-operative assessment

## 2021-07-17 NOTE — Progress Notes (Signed)
AssistedDr. Turk with left, ultrasound guided, adductor canal block. Side rails up, monitors on throughout procedure. See vital signs in flow sheet. Tolerated Procedure well.  

## 2021-07-17 NOTE — Care Plan (Signed)
Ortho Bundle Case Management Note  Patient Details  Name: Samantha Jenkins MRN: [id number] Date of Birth: 08-17-1941                  L TKA on 07-17-21 DCP: Home with son. 1 story home with 1 ste. DME: No needs. Has a RW and 3-in-1. PT: EO. PT eval to be scheduled on 07-20-21.   DME Arranged:  N/A DME Agency:     HH Arranged:    HH Agency:     Additional Comments: Please contact me with any questions of if this plan should need to change.  Marianne Sofia, RN,CCM [facility]  [phone] 07/17/2021, 10:56 AM

## 2021-07-17 NOTE — Discharge Instructions (Signed)

## 2021-07-17 NOTE — Transfer of Care (Signed)
Immediate Anesthesia Transfer of Care Note  Patient: Samantha Jenkins  Procedure(s) Performed: TOTAL KNEE ARTHROPLASTY (Left: Knee)  Patient Location: PACU  Anesthesia Type:GA combined with regional for post-op pain  Level of Consciousness: awake, alert , oriented and patient cooperative  Airway & Oxygen Therapy: Patient Spontanous Breathing and Patient connected to face mask oxygen  Post-op Assessment: Report given to RN and Post -op Vital signs reviewed and stable  Post vital signs: Reviewed and stable  Last Vitals:  Vitals Value Taken Time  BP    Temp    Pulse    Resp    SpO2      Last Pain:  Vitals:   07/17/21 0751  TempSrc: Oral         Complications: No notable events documented.

## 2021-07-17 NOTE — Interval H&P Note (Signed)
History and Physical Interval Note:  07/17/2021 8:32 AM  Samantha Jenkins  has presented today for surgery, with the diagnosis of Left knee osteoarthritis.  The various methods of treatment have been discussed with the patient and family. After consideration of risks, benefits and other options for treatment, the patient has consented to  Procedure(s): TOTAL KNEE ARTHROPLASTY (Left) as a surgical intervention.  The patient's history has been reviewed, patient examined, no change in status, stable for surgery.  I have reviewed the patient's chart and labs.  Questions were answered to the patient's satisfaction.     Mauri Pole

## 2021-07-17 NOTE — Op Note (Signed)
NAME:  Samantha Jenkins                      MEDICAL RECORD NO.:  [id number]                             FACILITY:  [facility]      PHYSICIAN:  Pietro Cassis. Alvan Dame, M.D.  DATE OF BIRTH:  1941/09/10      DATE OF PROCEDURE:  07/17/2021                                     OPERATIVE REPORT         PREOPERATIVE DIAGNOSIS:  Left knee osteoarthritis.      POSTOPERATIVE DIAGNOSIS:  Left knee osteoarthritis.      FINDINGS:  The patient was noted to have complete loss of cartilage and   bone-on-bone arthritis with associated osteophytes in the lateral and patellofemoral compartments of   the knee with a significant synovitis and associated effusion.  The patient had failed months of conservative treatment including medications, injection therapy, activity modification.     PROCEDURE:  Left total knee replacement.      COMPONENTS USED:  DePuy Attune rotating platform posterior stabilized knee   system, a size 5N femur, 4 tibia, size 5 mm PS AOX insert, and 45 anatomic patellar   button.      SURGEON:  Pietro Cassis. Alvan Dame, M.D.      ASSISTANT:  Costella Hatcher, PA-C.      ANESTHESIA:  General and Regional.      SPECIMENS:  None.      COMPLICATION:  None.      DRAINS:  None.  EBL: about 150 cc      TOURNIQUET TIME:   Total Tourniquet Time Documented: Thigh (Left) - 3 minutes Thigh (Left) - 30 minutes Total: Thigh (Left) - 33 minutes  .      The patient was stable to the recovery room.      INDICATION FOR PROCEDURE:  Samantha Jenkins is a 80 y.o. female patient of   mine.  The patient had been seen, evaluated, and treated for months conservatively in the   office with medication, activity modification, and injections.  The patient had   radiographic changes of bone-on-bone arthritis with endplate sclerosis and osteophytes noted.  Based on the radiographic changes and failed conservative measures, the patient   decided to proceed with definitive treatment, total knee replacement.  Risks of infection,  DVT, component failure, need for revision surgery, neurovascular injury were reviewed in the office setting.  The postop course was reviewed stressing the efforts to maximize post-operative satisfaction and function.  Consent was obtained for benefit of pain   relief.      PROCEDURE IN DETAIL:  The patient was brought to the operative theater.   Once adequate anesthesia, preoperative antibiotics, 2 gm of Ancef,1 gm of Tranexamic Acid, and 10 mg of Decadron administered, the patient was positioned supine with a left thigh tourniquet placed.  The  left lower extremity was prepped and draped in sterile fashion.  A time-   out was performed identifying the patient, planned procedure, and the appropriate extremity.      The left lower extremity was placed in the [employer] leg holder.  The leg was   exsanguinated, tourniquet elevated to 250 mmHg.  A midline incision was   made followed by median parapatellar arthrotomy.  Following initial   exposure, attention was first directed to the patella.  Precut   measurement was noted to be 24 mm.  I resected down to 14 mm and used a   35 anatomic patellar button to restore patellar height as well as cover the cut surface.      The lug holes were drilled and a metal shim was placed to protect the   patella from retractors and saw blade during the procedure.      At this point, attention was now directed to the femur.  The femoral   canal was opened with a drill, irrigated to try to prevent fat emboli.  An   intramedullary rod was passed at 3 degrees valgus, 11 mm of bone was   resected off the distal femur due to preoperative flexion contracture.  Following this resection, the tibia was   subluxated anteriorly.  Using the extramedullary guide, 2 mm of bone was resected off   the proximal lateral tibia.  We confirmed the gap would be   stable medially and laterally with a size 65 spacer block as well as confirmed that the tibial cut was perpendicular in the  coronal plane, checking with an alignment rod.      Once this was done, I sized the femur to be a size 5 in the anterior-   posterior dimension, chose a narrow component based on medial and   lateral dimension.  The size 5 rotation block was then pinned in   position anterior referenced using the C-clamp to set rotation.  The   anterior, posterior, and  chamfer cuts were made without difficulty nor   notching making certain that I was along the anterior cortex to help   with flexion gap stability.      The final box cut was made off the lateral aspect of distal femur.      At this point, the tibia was sized to be a size 4.  The size 4 tray was   then pinned in position through the medial third of the tubercle,   drilled, and keel punched.  Trial reduction was now carried with a 5 femur,  4 tibia, a size 5 mm PS insert, and the 35 anatomic patella botton.  The knee was brought to full extension with good flexion stability with the patella   tracking through the trochlea without application of pressure.  Given   all these findings the trial components removed.  Final components were   opened and cement was mixed.  The knee was irrigated with normal saline solution and pulse lavage.  The synovial lining was   then injected with 30 cc of 0.25% Marcaine with epinephrine, 1 cc of Toradol and 30 cc of NS for a total of 61 cc.     Final implants were then cemented onto cleaned and dried cut surfaces of bone with the knee brought to extension with a size 5 mm PS trial insert.      Once the cement had fully cured, excess cement was removed   throughout the knee.  I confirmed that I was satisfied with the range of   motion and stability, and the final size 5 mm PS AOX insert was chosen.  It was   placed into the knee.      The tourniquet had been let down at 33 minutes.  No significant   hemostasis was  required.  The extensor mechanism was then reapproximated using #1 Vicryl and #1 Stratafix  sutures with the knee   in flexion.  The   remaining wound was closed with 2-0 Vicryl and running 4-0 Monocryl.   The knee was cleaned, dried, dressed sterilely using Dermabond and   Aquacel dressing.  The patient was then   brought to recovery room in stable condition, tolerating the procedure   well.   Please note that Physician Assistant, Costella Hatcher, PA-C was present for the entirety of the case, and was utilized for pre-operative positioning, peri-operative retractor management, general facilitation of the procedure and for primary wound closure at the end of the case.              Pietro Cassis Alvan Dame, M.D.    07/17/2021 11:00 AM

## 2021-07-17 NOTE — Anesthesia Procedure Notes (Signed)
Anesthesia Regional Block: Adductor canal block   Pre-Anesthetic Checklist: , timeout performed,  Correct Patient, Correct Site, Correct Laterality,  Correct Procedure, Correct Position, site marked,  Risks and benefits discussed,  Surgical consent,  Pre-op evaluation,  At surgeon's request and post-op pain management  Laterality: Left  Prep: chloraprep       Needles:  Injection technique: Single-shot  Needle Type: Echogenic Needle     Needle Length: 9cm  Needle Gauge: 21     Additional Needles:   Procedures:,,,, ultrasound used (permanent image in chart),,    Narrative:  Start time: 07/17/2021 9:01 AM End time: 07/17/2021 9:08 AM Injection made incrementally with aspirations every 5 mL.  Performed by: Personally  Anesthesiologist: Catalina Gravel, MD  Additional Notes: No pain on injection. No increased resistance to injection. Injection made in 5cc increments.  Good needle visualization.  Patient tolerated procedure well.

## 2021-07-18 ENCOUNTER — Encounter (HOSPITAL_COMMUNITY): Payer: Self-pay | Admitting: Orthopedic Surgery

## 2021-07-18 DIAGNOSIS — M1712 Unilateral primary osteoarthritis, left knee: Secondary | ICD-10-CM | POA: Diagnosis not present

## 2021-07-18 LAB — BASIC METABOLIC PANEL
Anion gap: 8 (ref 5–15)
BUN: 16 mg/dL (ref 8–23)
CO2: 23 mmol/L (ref 22–32)
Calcium: 8.8 mg/dL — ABNORMAL LOW (ref 8.9–10.3)
Chloride: 107 mmol/L (ref 98–111)
Creatinine, Ser: 1.06 mg/dL — ABNORMAL HIGH (ref 0.44–1.00)
GFR, Estimated: 53 mL/min — ABNORMAL LOW (ref >60)
Glucose, Bld: 138 mg/dL — ABNORMAL HIGH (ref 70–99)
Potassium: 3.6 mmol/L (ref 3.5–5.1)
Sodium: 138 mmol/L (ref 135–145)

## 2021-07-18 LAB — CBC
HCT: 31.6 % — ABNORMAL LOW (ref 36.0–46.0)
Hemoglobin: 10.7 g/dL — ABNORMAL LOW (ref 12.0–15.0)
MCH: 32.2 pg (ref 26.0–34.0)
MCHC: 33.9 g/dL (ref 30.0–36.0)
MCV: 95.2 fL (ref 80.0–100.0)
Platelets: 169 10*3/uL (ref 150–400)
RBC: 3.32 MIL/uL — ABNORMAL LOW (ref 3.87–5.11)
RDW: 13.5 % (ref 11.5–15.5)
WBC: 10.3 10*3/uL (ref 4.0–10.5)
nRBC: 0 % (ref 0.0–0.2)

## 2021-07-18 NOTE — TOC Transition Note (Signed)
Transition of Care [facility]) - CM/SW Discharge Note  Patient Details  Name: Samantha Jenkins MRN: [id number] Date of Birth: 1940/12/05  [facility] CM/SW Contact:  Sherie Don, LCSW Phone Number: 07/18/2021, 10:10 AM  Clinical Narrative: Patient is expected to discharge home after working with PT. CSW met with patient to confirm discharge plan and assess for needs. Patient will discharge home with OPPT through [facility]. Patient has a rolling walker and 3N1 at home, so there are no DME needs at this time. TOC signing off.  Final next level of care: OP Rehab Barriers to Discharge: No Barriers Identified  Patient Goals and CMS Choice Patient states their goals for this hospitalization and ongoing recovery are:: Discharge home with [place] CMS Medicare.gov Compare Post Acute Care list provided to:: Patient Choice offered to / list presented to : Patient  Discharge Plan and Services        DME Arranged: N/A DME Agency: NA  Readmission Risk Interventions No flowsheet data found.

## 2021-07-18 NOTE — Progress Notes (Signed)
   Subjective: 1 Day Post-Op Procedure(s) (LRB): TOTAL KNEE ARTHROPLASTY (Left) Patient reports pain as mild.   Patient seen in rounds by Dr. Alvan Dame. Patient is well, and has had no acute complaints or problems. No acute events overnight. We will start therapy today.   Objective: Vital signs in last 24 hours: Temp:  [97.5 F (36.4 C)-98.8 F (37.1 C)] 98.2 F (36.8 C) (08/31 0521) Pulse Rate:  [49-66] 49 (08/31 0521) Resp:  [10-18] 14 (08/31 0521) BP: (120-201)/(59-107) 121/71 (08/31 0521) SpO2:  [90 %-100 %] 96 % (08/31 0521) Weight:  [86.6 kg] 86.6 kg (08/30 1818)  Intake/Output from previous day:  Intake/Output Summary (Last 24 hours) at 07/18/2021 0808 Last data filed at 07/18/2021 0534 Gross per 24 hour  Intake 1892.19 ml  Output 650 ml  Net 1242.19 ml     Intake/Output this shift: No intake/output data recorded.  Labs: Recent Labs    07/18/21 0339  HGB 10.7*   Recent Labs    07/18/21 0339  WBC 10.3  RBC 3.32*  HCT 31.6*  PLT 169   Recent Labs    07/18/21 0339  NA 138  K 3.6  CL 107  CO2 23  BUN 16  CREATININE 1.06*  GLUCOSE 138*  CALCIUM 8.8*   No results for input(s): LABPT, INR in the last 72 hours.  Exam: General - Patient is Alert and Oriented Extremity - Neurologically intact Sensation intact distally Intact pulses distally Dressing - dressing C/D/I Motor Function - intact, moving foot and toes well on exam.   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   Weight as of this encounter: 86.6 kg. Advance diet Up with therapy D/C IV fluids  DVT Prophylaxis - Aspirin Weight bearing as tolerated.  Hemoglobin stable at 10.7 this AM.   Plan is to go Home after hospital stay. Plan for discharge today after 1-2 sessions of therapy as long as she is meeting her goals. Follow up in the office in 2 weeks.   Hydrocodone, aspirin, meloxicam, robaxin sent ahead of time.   Griffith Citron, PA-C Orthopedic Surgery [phone] 07/18/2021, 8:08 AM

## 2021-07-18 NOTE — Progress Notes (Signed)
Physical Therapy Treatment Patient Details Name: Samantha Jenkins MRN: [id number] DOB: Mar 04, 1941 Today's Date: 07/18/2021    History of Present Illness 80 y.o. female admitted 07/17/2021 for L TKA. PMH includes asthma, HTN, obesity, R TKA 05/2020.    PT Comments    Pt is mobilizing well. Stair training completed. Completed instruction in TKA HEP. She is ready to DC home from PT standpoint.     Follow Up Recommendations  Follow surgeon's recommendation for DC plan and follow-up therapies     Equipment Recommendations  None recommended by PT    Recommendations for Other Services       Precautions / Restrictions Precautions Precautions: Fall;Knee Precaution Booklet Issued: Yes (comment) Precaution Comments: reviewed no pillow under knee Restrictions Weight Bearing Restrictions: No    Mobility  Bed Mobility Overal bed mobility: Modified Independent             General bed mobility comments: up in recliner    Transfers Overall transfer level: Needs assistance Equipment used: Rolling walker (2 wheeled) Transfers: Sit to/from Stand Sit to Stand: Supervision         General transfer comment: VCs hand placement  Ambulation/Gait Ambulation/Gait assistance: Supervision Gait Distance (Feet): 90 Feet Assistive device: Rolling walker (2 wheeled) Gait Pattern/deviations: Step-through pattern;Decreased stride length Gait velocity: decr   General Gait Details: steady, no loss of balance   Stairs Stairs: Yes Stairs assistance: Min guard Stair Management: No rails;Backwards;Forwards;With walker Number of Stairs: 1 General stair comments: 1 step x 2 trials, once forwards, once backwards, min/guard for safety, VCs sequencing   Wheelchair Mobility    Modified Rankin (Stroke Patients Only)       Balance Overall balance assessment: Modified Independent                                          Cognition Arousal/Alertness: Awake/alert Behavior  During Therapy: WFL for tasks assessed/performed Overall Cognitive Status: Within Functional Limits for tasks assessed                                        Exercises Total Joint Exercises Ankle Circles/Pumps: AROM;Both;10 reps Quad Sets: 5 reps Short Arc Quad: AROM;Left;10 reps;Supine Heel Slides: AAROM;Left;10 reps;Supine Hip ABduction/ADduction: AAROM;Left;10 reps;Supine Straight Leg Raises: AROM;Left;5 reps;Supine Long Arc Quad: AROM;Left;10 reps;Seated Knee Flexion: AAROM;Left;10 reps;Seated    General Comments        Pertinent Vitals/Pain Pain Assessment: 0-10 Pain Score: 3  Pain Location: L knee Pain Descriptors / Indicators: Aching Pain Intervention(s): Limited activity within patient's tolerance;Monitored during session;Premedicated before session;Ice applied    Home Living Family/patient expects to be discharged to:: Private residence Living Arrangements: Alone Available Help at Discharge: Family;Available 24 hours/day Type of Home: House Home Access: Stairs to enter   Home Layout: One level Home Equipment: Environmental consultant - 2 wheels;Walker - 4 wheels;Toilet riser Additional Comments: plans to DC to son's home for first 5 days, info above is for son's home    Prior Function Level of Independence: Independent with assistive device(s)      Comments: used SPC as needed   PT Goals (current goals can now be found in the care plan section) Acute Rehab PT Goals Patient Stated Goal: likes to read PT Goal Formulation: With patient Time For Goal Achievement: 07/25/21 Potential to  Achieve Goals: Good Progress towards PT goals: Progressing toward goals    Frequency    7X/week      PT Plan Current plan remains appropriate    Co-evaluation              AM-PAC PT "6 Clicks" Mobility   Outcome Measure  Help needed turning from your back to your side while in a flat bed without using bedrails?: None Help needed moving from lying on your back to  sitting on the side of a flat bed without using bedrails?: A Little Help needed moving to and from a bed to a chair (including a wheelchair)?: None Help needed standing up from a chair using your arms (e.g., wheelchair or bedside chair)?: None Help needed to walk in hospital room?: None Help needed climbing 3-5 steps with a railing? : A Little 6 Click Score: 22    End of Session Equipment Utilized During Treatment: Gait belt Activity Tolerance: Patient tolerated treatment well Patient left: in chair;with call bell/phone within reach;with nursing/sitter in room Nurse Communication: Mobility status PT Visit Diagnosis: Difficulty in walking, not elsewhere classified (R26.2);Pain Pain - Right/Left: Left Pain - part of body: Knee     Time: [id number] PT Time Calculation (min) (ACUTE ONLY): 18 min  Charges:  $Gait Training: 8-22 mins                     Blondell Reveal Kistler PT 07/18/2021  Acute Rehabilitation Services Pager [phone] Office [phone]

## 2021-07-18 NOTE — Evaluation (Signed)
Physical Therapy Evaluation Patient Details Name: Samantha Jenkins MRN: [id number] DOB: 12/15/1940 Today's Date: 07/18/2021   History of Present Illness  80 y.o. female admitted 07/17/2021 for L TKA. PMH includes asthma, HTN, obesity, R TKA 05/2020.  Clinical Impression  Pt is s/p TKA resulting in the deficits listed below (see PT Problem List). Pt  ambulated 150' with RW. INitiated TKA HEP. Will plan to do stair training this afternoon.  Pt will benefit from skilled PT to increase their independence and safety with mobility to allow discharge to the venue listed below.      Follow Up Recommendations Follow surgeon's recommendation for DC plan and follow-up therapies    Equipment Recommendations  None recommended by PT    Recommendations for Other Services       Precautions / Restrictions Precautions Precautions: Fall;Knee Precaution Booklet Issued: Yes (comment) Precaution Comments: reviewed no pillow under knee Restrictions Weight Bearing Restrictions: No      Mobility  Bed Mobility Overal bed mobility: Modified Independent             General bed mobility comments: HOB up, used rail    Transfers Overall transfer level: Needs assistance Equipment used: Rolling walker (2 wheeled) Transfers: Sit to/from Stand Sit to Stand: Min guard         General transfer comment: VCs hand placement, min/guard safety  Ambulation/Gait Ambulation/Gait assistance: Min guard Gait Distance (Feet): 150 Feet Assistive device: Rolling walker (2 wheeled) Gait Pattern/deviations: Step-through pattern;Decreased stride length Gait velocity: decr   General Gait Details: steady, no loss of balance, pt incontinent of urine at baseline  Stairs            Wheelchair Mobility    Modified Rankin (Stroke Patients Only)       Balance Overall balance assessment: Modified Independent                                           Pertinent Vitals/Pain Pain Assessment:  0-10 Pain Score: 3  Pain Location: L knee Pain Descriptors / Indicators: Aching Pain Intervention(s): Limited activity within patient's tolerance;Monitored during session;Premedicated before session    Home Living Family/patient expects to be discharged to:: Private residence Living Arrangements: Alone Available Help at Discharge: Family;Available 24 hours/day Type of Home: House Home Access: Stairs to enter   [employer] of Steps: 1 Home Layout: One level Home Equipment: Walker - 2 wheels;Walker - 4 wheels;Toilet riser Additional Comments: plans to DC to son's home for first 5 days, info above is for son's home    Prior Function Level of Independence: Independent with assistive device(s)         Comments: used SPC as needed     Hand Dominance        Extremity/Trunk Assessment   Upper Extremity Assessment Upper Extremity Assessment: Overall WFL for tasks assessed    Lower Extremity Assessment Lower Extremity Assessment: LLE deficits/detail LLE Deficits / Details: 0-50* AAROM L knee LLE Sensation: WNL LLE Coordination: WNL    Cervical / Trunk Assessment Cervical / Trunk Assessment: Normal  Communication   Communication: No difficulties  Cognition Arousal/Alertness: Awake/alert Behavior During Therapy: WFL for tasks assessed/performed Overall Cognitive Status: Within Functional Limits for tasks assessed  General Comments      Exercises Total Joint Exercises Ankle Circles/Pumps: AROM;Both;10 reps Quad Sets: AROM;Left;5 reps;Supine Heel Slides: AAROM;Left;10 reps;Supine Knee Flexion: AAROM;Left;10 reps;Seated   Assessment/Plan    PT Assessment Patient needs continued PT services  PT Problem List Decreased range of motion;Decreased strength;Decreased activity tolerance;Decreased mobility;Pain       PT Treatment Interventions Therapeutic activities;Therapeutic exercise;Gait training;Stair  training;Patient/family education    PT Goals (Current goals can be found in the Care Plan section)  Acute Rehab PT Goals Patient Stated Goal: likes to read PT Goal Formulation: With patient Time For Goal Achievement: 07/25/21 Potential to Achieve Goals: Good    Frequency 7X/week   Barriers to discharge        Co-evaluation               AM-PAC PT "6 Clicks" Mobility  Outcome Measure Help needed turning from your back to your side while in a flat bed without using bedrails?: None Help needed moving from lying on your back to sitting on the side of a flat bed without using bedrails?: A Little Help needed moving to and from a bed to a chair (including a wheelchair)?: A Little Help needed standing up from a chair using your arms (e.g., wheelchair or bedside chair)?: A Little Help needed to walk in hospital room?: A Little Help needed climbing 3-5 steps with a railing? : A Little 6 Click Score: 19    End of Session Equipment Utilized During Treatment: Gait belt Activity Tolerance: Patient tolerated treatment well Patient left: in chair;with call bell/phone within reach;with nursing/sitter in room Nurse Communication: Mobility status PT Visit Diagnosis: Difficulty in walking, not elsewhere classified (R26.2);Pain Pain - Right/Left: Left Pain - part of body: Knee    Time: 1120-1156 PT Time Calculation (min) (ACUTE ONLY): 36 min   Charges:   PT Evaluation $PT Eval Moderate Complexity: 1 Mod PT Treatments $Gait Training: 8-22 mins       Blondell Reveal Kistler PT 07/18/2021  Acute Rehabilitation Services Pager [phone] Office [phone]

## 2021-07-18 NOTE — Progress Notes (Signed)
Nurse reviewed discharge instructions with pt.  Pt verbalized understanding of discharge instructions, follow up appointments and new medications.  No concerns at time of discharge. 

## 2021-07-19 NOTE — Discharge Summary (Signed)
Physician Discharge Summary   Patient ID: Samantha Jenkins MRN: [id number] DOB/AGE: June 15, 1941 80 y.o.  Admit date: 07/17/2021 Discharge date: 07/18/2021  Primary Diagnosis:  Left knee osteoarthritis.   Admission Diagnoses:  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   Weight as of this encounter: 86.6 kg.  Procedure:  Procedure(s) (LRB): TOTAL KNEE ARTHROPLASTY (Left)   Consults: None  HPI: Samantha Jenkins is a 80 y.o. female patient of   mine.  The patient had been seen, evaluated, and treated for months conservatively in the   office with medication, activity modification, and injections.  The patient had   radiographic changes of bone-on-bone arthritis with endplate sclerosis and osteophytes noted.  Based on the radiographic changes and failed conservative measures, the patient   decided to proceed with definitive treatment, total knee replacement.  Risks of infection, DVT, component failure, need for revision surgery, neurovascular injury were reviewed in the office setting.  The postop course was reviewed stressing the efforts to maximize post-operative satisfaction and function.  Consent was obtained for benefit of pain   relief.   Laboratory Data: Admission on 07/17/2021, Discharged on 07/18/2021  Component Date Value Ref Range Status   WBC  07/18/2021 10.3  4.0 - 10.5 K/uL Final   RBC 07/18/2021 3.32 (A) 3.87 - 5.11 MIL/uL Final   Hemoglobin 07/18/2021 10.7 (A) 12.0 - 15.0 g/dL Final   HCT 07/18/2021 31.6 (A) 36.0 - 46.0 % Final   MCV 07/18/2021 95.2  80.0 - 100.0 fL Final   MCH 07/18/2021 32.2  26.0 - 34.0 pg Final   MCHC 07/18/2021 33.9  30.0 - 36.0 g/dL Final   RDW 07/18/2021 13.5  11.5 - 15.5 % Final   Platelets 07/18/2021 169  150 - 400 K/uL Final   nRBC 07/18/2021 0.0  0.0 - 0.2 % Final   Performed at [facility], [place] [address] [place], [place] [address]   Sodium 07/18/2021 138  135 - 145 mmol/L Final   Potassium 07/18/2021 3.6  3.5 - 5.1 mmol/L Final   Chloride 07/18/2021 107  98 - 111 mmol/L Final   CO2 07/18/2021 23  22 - 32 mmol/L Final   Glucose, Bld 07/18/2021 138 (A) 70 - 99 mg/dL Final   Glucose reference range applies only to samples taken after fasting for at least 8 hours.   BUN 07/18/2021 16  8 - 23 mg/dL Final   Creatinine, Ser 07/18/2021 1.06 (A) 0.44 - 1.00 mg/dL Final   Calcium 07/18/2021 8.8 (A) 8.9 - 10.3 mg/dL Final   GFR, Estimated 07/18/2021 53 (A) >60 mL/min Final   Comment: (NOTE)  Calculated using the CKD-EPI Creatinine Equation (2021)    Anion gap 07/18/2021 8  5 - 15 Final   Performed at [facility], [place] [address] [place], [place] [address]  Orders Only on 07/13/2021  Component Date Value Ref Range Status   SARS Coronavirus 2 07/13/2021 RESULT: NEGATIVE   Final   Comment: RESULT: NEGATIVESARS-CoV-2 INTERPRETATION:A NEGATIVE  test result means that SARS-CoV-2 RNA was not present in the specimen above the limit of detection of this test. This does not preclude a possible SARS-CoV-2 infection and should not be used as the  sole basis for patient management decisions. Negative results must be combined with clinical observations, patient history, and epidemiological information. Optimum specimen types and timing for peak viral levels during infections  caused by SARS-CoV-2  have not been determined. Collection of multiple specimens or types of specimens may be necessary to detect virus. Improper specimen collection and handling, sequence variability under primers/probes, or organism present below the limit of detection may  lead to false negative results. Positive and negative predictive values of testing are highly dependent on prevalence. False negative test results are more likely when prevalence of disease is high.The expected result is NEGATIVE.Fact S                          heet for  Healthcare Providers: LocalChronicle.no Sheet for Patients: SalonLookup.es Reference Range - Negative   Hospital Outpatient Visit on 07/04/2021  Component Date Value Ref Range Status   WBC 07/04/2021 6.2  4.0 - 10.5 K/uL Final   RBC 07/04/2021 4.25  3.87 - 5.11 MIL/uL Final   Hemoglobin 07/04/2021 13.7  12.0 - 15.0 g/dL Final   HCT 07/04/2021 40.4  36.0 - 46.0 % Final   MCV 07/04/2021 95.1  80.0 - 100.0 fL Final   MCH 07/04/2021 32.2  26.0 - 34.0 pg Final   MCHC 07/04/2021 33.9  30.0 - 36.0 g/dL Final   RDW 07/04/2021 13.4  11.5 - 15.5 % Final   Platelets 07/04/2021 213  150 - 400 K/uL Final   nRBC 07/04/2021 0.0  0.0 - 0.2 % Final   Performed at [facility], [place] [address], [place], [place] [address]   MRSA, PCR 07/04/2021 NEGATIVE  NEGATIVE Final   Staphylococcus aureus 07/04/2021 NEGATIVE  NEGATIVE Final   Comment: (NOTE) The Xpert SA Assay (FDA approved for NASAL specimens in patients 73 years of age and older), is one component of a comprehensive surveillance program. It is not intended to diagnose infection nor to guide or monitor treatment. Performed at [facility], [place] [address], [place], [place] [address]    Cholesterol 07/04/2021 196  0 - 200 mg/dL Final   Triglycerides 07/04/2021 109  <150 mg/dL Final   HDL 07/04/2021 52  >40 mg/dL  Final   Total CHOL/HDL Ratio 07/04/2021 3.8  RATIO Final   VLDL 07/04/2021 22  0 - 40 mg/dL Final   LDL Cholesterol 07/04/2021 122 (A) 0 - 99 mg/dL Final   Comment:        Total Cholesterol/HDL:CHD Risk Coronary Heart Disease Risk Table                     Men   Women  1/2 Average Risk   3.4   3.3  Average Risk       5.0   4.4  2 X Average Risk   9.6   7.1  3 X Average Risk  23.4   11.0        Use the calculated Patient Ratio above and the CHD Risk Table to determine the patient's CHD Risk.        ATP III CLASSIFICATION (LDL):  <100     mg/dL   Optimal  100-129  mg/dL   Near or Above                    Optimal  130-159  mg/dL   Borderline  160-189  mg/dL   High  >190     mg/dL   Very High Performed at [place] [address] [place], [place] [address]    Sodium 07/04/2021 140  135 - 145 mmol/L Final   Potassium 07/04/2021 3.4 (A) 3.5 - 5.1 mmol/L Final   Chloride 07/04/2021 109  98 - 111 mmol/L Final   CO2 07/04/2021 24  22 - 32 mmol/L Final   Glucose, Bld 07/04/2021 106 (A) 70 - 99 mg/dL Final   Glucose reference range applies only to samples taken after fasting for at least 8 hours.   BUN 07/04/2021 18  8 - 23 mg/dL Final   Creatinine, Ser 07/04/2021 1.08 (A) 0.44 - 1.00 mg/dL Final   Calcium 07/04/2021 9.5  8.9 - 10.3 mg/dL Final   Total Protein 07/04/2021 7.1  6.5 - 8.1 g/dL Final   Albumin 07/04/2021 4.0  3.5 - 5.0 g/dL Final   AST 07/04/2021 23  15 - 41 U/L Final   ALT 07/04/2021 15  0 - 44 U/L Final   Alkaline Phosphatase 07/04/2021 100  38 - 126 U/L Final   Total Bilirubin 07/04/2021 1.2  0.3 - 1.2 mg/dL Final   GFR, Estimated 07/04/2021 52 (A) >60 mL/min Final   Comment: (NOTE) Calculated using the CKD-EPI Creatinine Equation (2021)    Anion gap 07/04/2021 7  5 - 15 Final   Performed at [facility], [place] [address], [place], [place] [address]   ABO/RH(D) 07/04/2021 O POS   Final   Antibody Screen 07/04/2021 NEG    Final   Sample Expiration 07/04/2021 07/18/2021,2359   Final   Extend sample reason 07/04/2021    Final                   Value:NO TRANSFUSIONS OR PREGNANCY IN THE PAST 3 MONTHS Performed at [facility] [address], [place], [place] [address]      X-Rays:No results found.  EKG: Orders placed or performed during the hospital encounter of 07/17/21   EKG 12-Lead   EKG 12-Lead     Hospital Course: Samantha Jenkins is a 80 y.o. who was admitted to [facility]. They were brought to the operating room on 07/17/2021 and underwent Procedure(s): TOTAL KNEE ARTHROPLASTY.  Patient tolerated the procedure well and was later transferred to the recovery room and then to the orthopaedic floor for postoperative care. They were given PO and IV analgesics for pain control following their surgery. They were given 24 hours of postoperative antibiotics of  Anti-infectives (From admission, onward)    Start     Dose/Rate Route Frequency Ordered Stop   07/17/21 2000  ceFAZolin (ANCEF) IVPB 2g/100 mL premix        2 g 200 mL/hr over 30 Minutes Intravenous Every 6 hours 07/17/21 1832 07/18/21 0327   07/17/21 0745  ceFAZolin (ANCEF) IVPB 2g/100 mL premix  2 g 200 mL/hr over 30 Minutes Intravenous On call to O.R. 07/17/21 [id number] 07/17/21 0956      and started on DVT prophylaxis in the form of Aspirin.   PT and OT were ordered for total joint protocol. Discharge planning consulted to help with postop disposition and equipment needs.  Patient had a good night on the evening of surgery. They started to get up OOB with therapy on POD #1. Pt was seen during rounds and was ready to go home pending progress with therapy.She worked with therapy on POD #1 and was meeting her goals. Pt was discharged to home later that day in stable condition.  Diet: Regular diet Activity: WBAT Follow-up: in 2 weeks Disposition: Home Discharged Condition: good   Discharge Instructions     Call MD /  Call 911   Complete by: As directed    If you experience chest pain or shortness of breath, CALL 911 and be transported to the hospital emergency room.  If you develope a fever above 101 F, pus (white drainage) or increased drainage or redness at the wound, or calf pain, call your surgeon's office.   Change dressing   Complete by: As directed    Maintain surgical dressing until follow up in the clinic. If the edges start to pull up, may reinforce with tape. If the dressing is no longer working, may remove and cover with gauze and tape, but must keep the area dry and clean.  Call with any questions or concerns.   Constipation Prevention   Complete by: As directed    Drink plenty of fluids.  Prune juice may be helpful.  You may use a stool softener, such as Colace (over the counter) 100 mg twice a day.  Use MiraLax (over the counter) for constipation as needed.   Diet - low sodium heart healthy   Complete by: As directed    Increase activity slowly as tolerated   Complete by: As directed    Weight bearing as tolerated with assist device (walker, cane, etc) as directed, use it as long as suggested by your surgeon or therapist, typically at least 4-6 weeks.   Post-operative opioid taper instructions:   Complete by: As directed    POST-OPERATIVE OPIOID TAPER INSTRUCTIONS: It is important to wean off of your opioid medication as soon as possible. If you do not need pain medication after your surgery it is ok to stop day one. Opioids include: Codeine, Hydrocodone(Norco, Vicodin), Oxycodone(Percocet, oxycontin) and hydromorphone amongst others.  Long term and even short term use of opiods can cause: Increased pain response Dependence Constipation Depression Respiratory depression And more.  Withdrawal symptoms can include Flu like symptoms Nausea, vomiting And more Techniques to manage these symptoms Hydrate well Eat regular healthy meals Stay active Use relaxation techniques(deep  breathing, meditating, yoga) Do Not substitute Alcohol to help with tapering If you have been on opioids for less than two weeks and do not have pain than it is ok to stop all together.  Plan to wean off of opioids This plan should start within one week post op of your joint replacement. Maintain the same interval or time between taking each dose and first decrease the dose.  Cut the total daily intake of opioids by one tablet each day Next start to increase the time between doses. The last dose that should be eliminated is the evening dose.      TED hose   Complete by: As directed    Use  stockings (TED hose) for 2 weeks on both leg(s).  You may remove them at night for sleeping.      Allergies as of 07/18/2021       Reactions   Statins    Muscle aches can take pravastatin   Sulfa Antibiotics Other (See Comments)   Blood in urine        Medication List     STOP taking these medications    acetaminophen 650 MG CR tablet Commonly known as: TYLENOL   traMADol 50 MG tablet Commonly known as: Ultram       TAKE these medications    albuterol 108 (90 Base) MCG/ACT inhaler Commonly known as: VENTOLIN HFA Inhale 2 puffs into the lungs every 6 (six) hours as needed for wheezing or shortness of breath.   bacitracin-polymyxin b ointment Commonly known as: POLYSPORIN Apply 1 application topically daily as needed (wound care).   bisoprolol-hydrochlorothiazide 5-6.25 MG tablet Commonly known as: ZIAC TAKE 1 TABLET BY MOUTH DAILY. What changed: when to take this   diphenhydramine-acetaminophen 25-500 MG Tabs tablet Commonly known as: TYLENOL PM Take 1-2 tablets by mouth at bedtime as needed (sleep).   Pepto-Bismol 262 MG Tabs Generic drug: Bismuth Subsalicylate Take 99991111 mg by mouth 2 (two) times daily as needed (indigestion).   pravastatin 40 MG tablet Commonly known as: PRAVACHOL TAKE 1 TABLET EVERY DAY What changed: when to take this   sertraline 100 MG  tablet Commonly known as: ZOLOFT TAKE 1 TABLET EVERY DAY What changed:  how much to take when to take this   sodium chloride 0.65 % nasal spray Commonly known as: OCEAN Place 1 spray into the nose as needed for congestion.   SYSTANE OP Place 1 drop into both eyes daily as needed (dry eyes).               Discharge Care Instructions  (From admission, onward)           Start     Ordered   07/18/21 0000  Change dressing       Comments: Maintain surgical dressing until follow up in the clinic. If the edges start to pull up, may reinforce with tape. If the dressing is no longer working, may remove and cover with gauze and tape, but must keep the area dry and clean.  Call with any questions or concerns.   07/18/21 [id number]            Follow-up Information     Paralee Cancel, MD. Go on 08/01/2021.   Specialty: Orthopedic Surgery Why: You are scheduled for first post op appointment on Wednesday September 14th at 9:45am. Contact information: [address] [place] [address] [id number]         Rosilyn Mings.. Go on 07/20/2021.   Why: You are scheduled for physical therapy eval on Friday September 2nd at 10:15am. Contact information: [address] [place] [address] [phone]                 Signed: Griffith Citron, PA-C Orthopedic Surgery 07/19/2021, 2:21 PM

## 2021-07-20 DIAGNOSIS — M25662 Stiffness of left knee, not elsewhere classified: Secondary | ICD-10-CM | POA: Diagnosis not present

## 2021-07-20 DIAGNOSIS — M25562 Pain in left knee: Secondary | ICD-10-CM | POA: Diagnosis not present

## 2021-07-25 DIAGNOSIS — M25562 Pain in left knee: Secondary | ICD-10-CM | POA: Diagnosis not present

## 2021-07-25 DIAGNOSIS — M25662 Stiffness of left knee, not elsewhere classified: Secondary | ICD-10-CM | POA: Diagnosis not present

## 2021-07-26 DIAGNOSIS — M25662 Stiffness of left knee, not elsewhere classified: Secondary | ICD-10-CM | POA: Diagnosis not present

## 2021-07-26 DIAGNOSIS — M25562 Pain in left knee: Secondary | ICD-10-CM | POA: Diagnosis not present

## 2021-08-01 DIAGNOSIS — M25662 Stiffness of left knee, not elsewhere classified: Secondary | ICD-10-CM | POA: Diagnosis not present

## 2021-08-01 DIAGNOSIS — M25562 Pain in left knee: Secondary | ICD-10-CM | POA: Diagnosis not present

## 2021-08-03 DIAGNOSIS — M25662 Stiffness of left knee, not elsewhere classified: Secondary | ICD-10-CM | POA: Diagnosis not present

## 2021-08-03 DIAGNOSIS — M25562 Pain in left knee: Secondary | ICD-10-CM | POA: Diagnosis not present

## 2021-08-08 DIAGNOSIS — M25562 Pain in left knee: Secondary | ICD-10-CM | POA: Diagnosis not present

## 2021-08-08 DIAGNOSIS — M25662 Stiffness of left knee, not elsewhere classified: Secondary | ICD-10-CM | POA: Diagnosis not present

## 2021-08-10 DIAGNOSIS — M25562 Pain in left knee: Secondary | ICD-10-CM | POA: Diagnosis not present

## 2021-08-10 DIAGNOSIS — M25662 Stiffness of left knee, not elsewhere classified: Secondary | ICD-10-CM | POA: Diagnosis not present

## 2021-08-17 DIAGNOSIS — M25562 Pain in left knee: Secondary | ICD-10-CM | POA: Diagnosis not present

## 2021-08-17 DIAGNOSIS — M25662 Stiffness of left knee, not elsewhere classified: Secondary | ICD-10-CM | POA: Diagnosis not present

## 2021-08-29 DIAGNOSIS — Z96652 Presence of left artificial knee joint: Secondary | ICD-10-CM | POA: Diagnosis not present

## 2021-08-29 DIAGNOSIS — Z471 Aftercare following joint replacement surgery: Secondary | ICD-10-CM | POA: Diagnosis not present

## 2021-09-01 DIAGNOSIS — Z23 Encounter for immunization: Secondary | ICD-10-CM | POA: Diagnosis not present

## 2022-01-14 DIAGNOSIS — I447 Left bundle-branch block, unspecified: Secondary | ICD-10-CM | POA: Diagnosis not present

## 2022-01-14 DIAGNOSIS — G4700 Insomnia, unspecified: Secondary | ICD-10-CM | POA: Diagnosis not present

## 2022-01-14 DIAGNOSIS — M8588 Other specified disorders of bone density and structure, other site: Secondary | ICD-10-CM | POA: Diagnosis not present

## 2022-01-14 DIAGNOSIS — E7800 Pure hypercholesterolemia, unspecified: Secondary | ICD-10-CM | POA: Diagnosis not present

## 2022-01-14 DIAGNOSIS — J45909 Unspecified asthma, uncomplicated: Secondary | ICD-10-CM | POA: Diagnosis not present

## 2022-01-14 DIAGNOSIS — N200 Calculus of kidney: Secondary | ICD-10-CM | POA: Diagnosis not present

## 2022-01-14 DIAGNOSIS — F324 Major depressive disorder, single episode, in partial remission: Secondary | ICD-10-CM | POA: Diagnosis not present

## 2022-01-14 DIAGNOSIS — M1990 Unspecified osteoarthritis, unspecified site: Secondary | ICD-10-CM | POA: Diagnosis not present

## 2022-01-14 DIAGNOSIS — Z0000 Encounter for general adult medical examination without abnormal findings: Secondary | ICD-10-CM | POA: Diagnosis not present

## 2022-01-14 DIAGNOSIS — N393 Stress incontinence (female) (male): Secondary | ICD-10-CM | POA: Diagnosis not present

## 2022-01-14 DIAGNOSIS — I10 Essential (primary) hypertension: Secondary | ICD-10-CM | POA: Diagnosis not present

## 2022-01-14 DIAGNOSIS — Z1389 Encounter for screening for other disorder: Secondary | ICD-10-CM | POA: Diagnosis not present

## 2022-02-11 DIAGNOSIS — M1612 Unilateral primary osteoarthritis, left hip: Secondary | ICD-10-CM | POA: Diagnosis not present

## 2022-02-11 DIAGNOSIS — M25552 Pain in left hip: Secondary | ICD-10-CM | POA: Diagnosis not present

## 2022-03-12 DIAGNOSIS — H353131 Nonexudative age-related macular degeneration, bilateral, early dry stage: Secondary | ICD-10-CM | POA: Diagnosis not present

## 2022-03-12 DIAGNOSIS — H43813 Vitreous degeneration, bilateral: Secondary | ICD-10-CM | POA: Diagnosis not present

## 2022-03-12 DIAGNOSIS — Z961 Presence of intraocular lens: Secondary | ICD-10-CM | POA: Diagnosis not present

## 2022-03-12 DIAGNOSIS — H52223 Regular astigmatism, bilateral: Secondary | ICD-10-CM | POA: Diagnosis not present

## 2022-03-14 IMAGING — CR DG CHEST 2V
2 series · 2 of 2 positions shown · non-contrast
Comparison: 07/01/2018

CLINICAL DATA: Chest pain x 2-3 days, asthma, pneumonia

EXAM:
CHEST - 2 VIEW

[w chest pa]
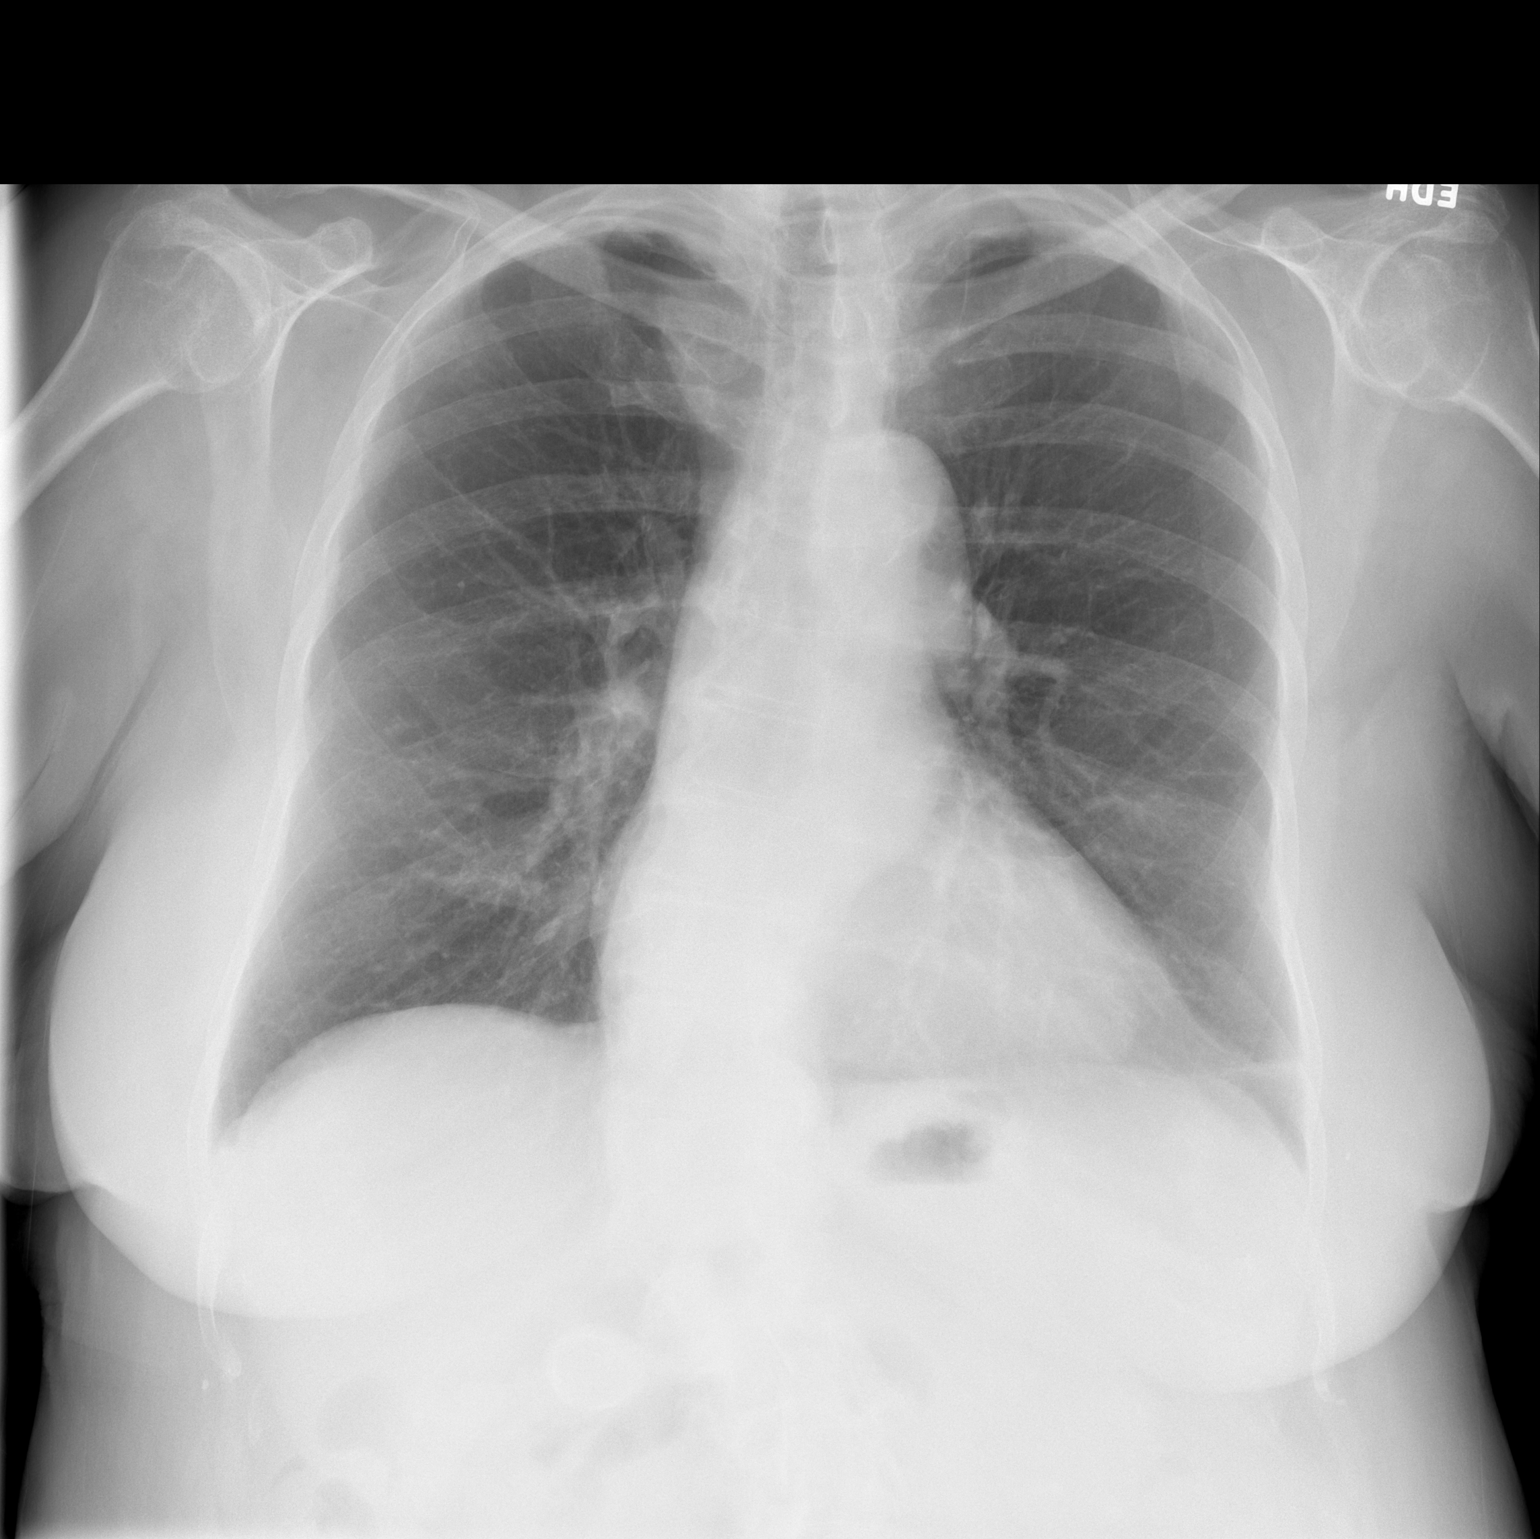

[w chest lat]
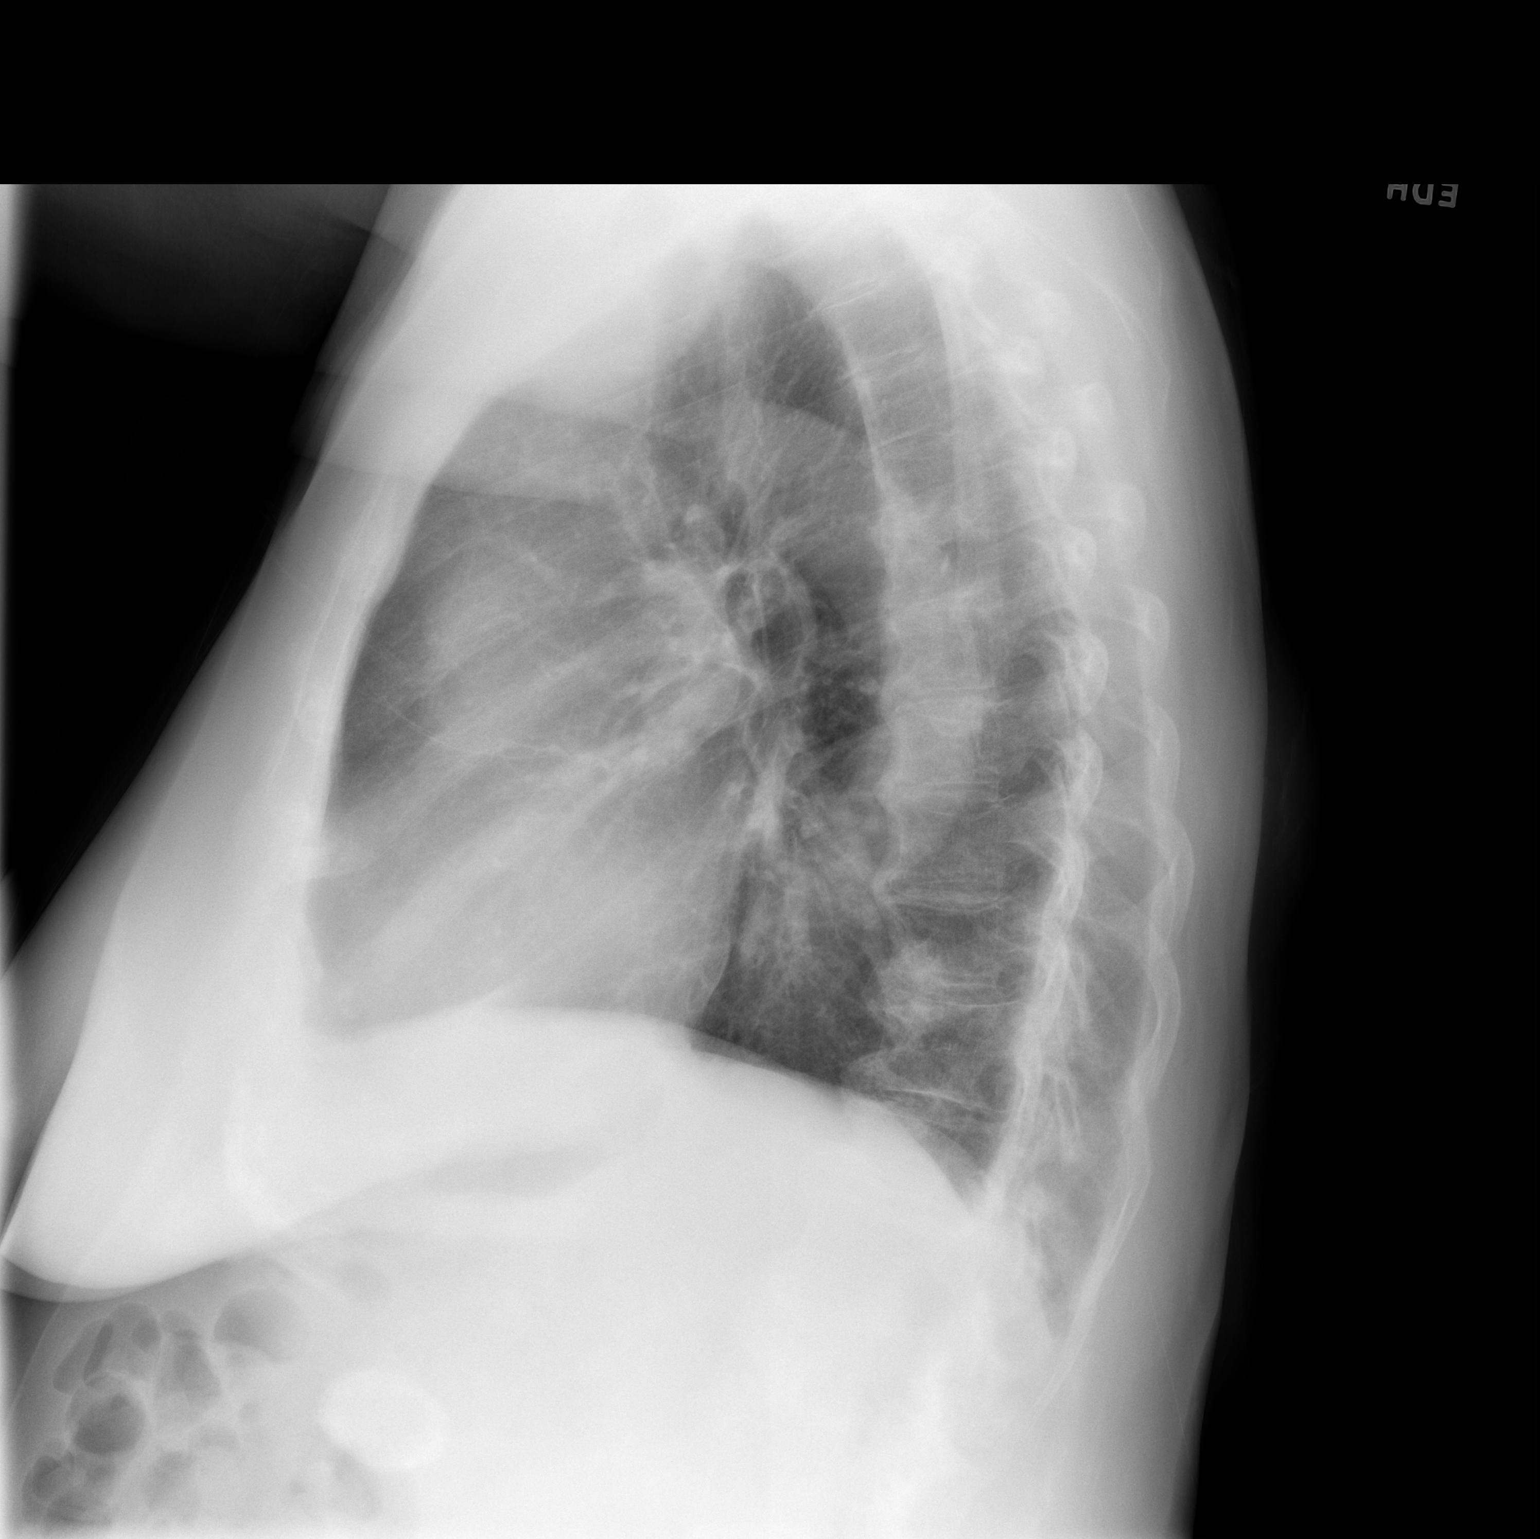

[2 of 2 positions shown; findings below may reference images not displayed]

FINDINGS: Mild left basilar scarring/atelectasis. Right lung is clear. No
pleural effusion or pneumothorax.

Heart is normal in size.

Degenerative changes of the visualized thoracolumbar spine.

Large gallstone overlying the right upper quadrant.
IMPRESSION: Normal chest radiographs.

## 2022-05-20 ENCOUNTER — Emergency Department (HOSPITAL_COMMUNITY): Payer: PPO

## 2022-05-20 ENCOUNTER — Encounter (HOSPITAL_COMMUNITY): Payer: Self-pay | Admitting: Pharmacy Technician

## 2022-05-20 ENCOUNTER — Emergency Department (HOSPITAL_COMMUNITY)
Admission: EM | Admit: 2022-05-20 | Discharge: 2022-05-20 | Disposition: A | Discharge status: Home / Self Care | Payer: PPO | Attending: Emergency Medicine | Admitting: Emergency Medicine

## 2022-05-20 DIAGNOSIS — S0090XA Unspecified superficial injury of unspecified part of head, initial encounter: Secondary | ICD-10-CM | POA: Diagnosis present

## 2022-05-20 DIAGNOSIS — Z23 Encounter for immunization: Secondary | ICD-10-CM | POA: Insufficient documentation

## 2022-05-20 DIAGNOSIS — W19XXXA Unspecified fall, initial encounter: Secondary | ICD-10-CM

## 2022-05-20 DIAGNOSIS — M542 Cervicalgia: Secondary | ICD-10-CM | POA: Diagnosis not present

## 2022-05-20 DIAGNOSIS — S0990XA Unspecified injury of head, initial encounter: Secondary | ICD-10-CM | POA: Diagnosis not present

## 2022-05-20 DIAGNOSIS — S0101XA Laceration without foreign body of scalp, initial encounter: Secondary | ICD-10-CM | POA: Diagnosis not present

## 2022-05-20 DIAGNOSIS — R001 Bradycardia, unspecified: Secondary | ICD-10-CM | POA: Insufficient documentation

## 2022-05-20 DIAGNOSIS — I10 Essential (primary) hypertension: Secondary | ICD-10-CM | POA: Diagnosis not present

## 2022-05-20 DIAGNOSIS — M4802 Spinal stenosis, cervical region: Secondary | ICD-10-CM | POA: Diagnosis not present

## 2022-05-20 DIAGNOSIS — S51011A Laceration without foreign body of right elbow, initial encounter: Secondary | ICD-10-CM | POA: Diagnosis not present

## 2022-05-20 DIAGNOSIS — M25521 Pain in right elbow: Secondary | ICD-10-CM

## 2022-05-20 DIAGNOSIS — M47812 Spondylosis without myelopathy or radiculopathy, cervical region: Secondary | ICD-10-CM | POA: Diagnosis not present

## 2022-05-20 DIAGNOSIS — R222 Localized swelling, mass and lump, trunk: Secondary | ICD-10-CM | POA: Diagnosis not present

## 2022-05-20 DIAGNOSIS — S199XXA Unspecified injury of neck, initial encounter: Secondary | ICD-10-CM | POA: Diagnosis not present

## 2022-05-20 DIAGNOSIS — I6782 Cerebral ischemia: Secondary | ICD-10-CM | POA: Diagnosis not present

## 2022-05-20 LAB — CBC
HCT: 44.2 % (ref 36.0–46.0)
Hemoglobin: 15 g/dL (ref 12.0–15.0)
MCH: 33.1 pg (ref 26.0–34.0)
MCHC: 33.9 g/dL (ref 30.0–36.0)
MCV: 97.6 fL (ref 80.0–100.0)
Platelets: 194 10*3/uL (ref 150–400)
RBC: 4.53 MIL/uL (ref 3.87–5.11)
RDW: 13.2 % (ref 11.5–15.5)
WBC: 8.4 10*3/uL (ref 4.0–10.5)
nRBC: 0 % (ref 0.0–0.2)

## 2022-05-20 LAB — COMPREHENSIVE METABOLIC PANEL
ALT: 17 U/L (ref 0–44)
AST: 33 U/L (ref 15–41)
Albumin: 3.8 g/dL (ref 3.5–5.0)
Alkaline Phosphatase: 98 U/L (ref 38–126)
Anion gap: 11 (ref 5–15)
BUN: 11 mg/dL (ref 8–23)
CO2: 25 mmol/L (ref 22–32)
Calcium: 9.8 mg/dL (ref 8.9–10.3)
Chloride: 104 mmol/L (ref 98–111)
Creatinine, Ser: 1.05 mg/dL — ABNORMAL HIGH (ref 0.44–1.00)
GFR, Estimated: 53 mL/min — ABNORMAL LOW (ref >60)
Glucose, Bld: 101 mg/dL — ABNORMAL HIGH (ref 70–99)
Potassium: 4.2 mmol/L (ref 3.5–5.1)
Sodium: 140 mmol/L (ref 135–145)
Total Bilirubin: 0.9 mg/dL (ref 0.3–1.2)
Total Protein: 6.7 g/dL (ref 6.5–8.1)

## 2022-05-20 MED ORDER — LIDOCAINE 4 % EX PTCH: 1.0000 | MEDICATED_PATCH | CUTANEOUS | 0 refills | Status: DC

## 2022-05-20 MED ORDER — TETANUS-DIPHTH-ACELL PERTUSSIS 5-2.5-18.5 LF-MCG/0.5 IM SUSY
0.5000 mL | PREFILLED_SYRINGE | Freq: Once | INTRAMUSCULAR | Status: AC
  Administered 2022-05-20: 0.5 mL via INTRAMUSCULAR
  Filled 2022-05-20: qty 0.5

## 2022-05-20 MED ORDER — LIDOCAINE 5 % EX PTCH
1.0000 | MEDICATED_PATCH | CUTANEOUS | Status: DC
  Administered 2022-05-20: 1 via TRANSDERMAL
  Filled 2022-05-20: qty 1

## 2022-05-20 NOTE — Discharge Instructions (Addendum)
You were seen in the ER for evaluation after your fall today.  Your head laceration is very superficial and does not need any additional treatment.  Please make sure you are keeping the area clean with plenty of soap and water.  Your imaging of your head, neck, and elbow are reassuring as well.  You can take Tylenol or ibuprofen as needed for pain.  I would also like for you to apply lidocaine patches to the area of your neck.  I have sent in a prescription of these however you can get them over-the-counter if your insurance does not cover them.  Please follow with your primary care doctor within the week.  If you have any concerns, new or worsening symptoms, please return to the nearest emergency department for evaluation.  Get help right away if: You have: A very bad headache that is not helped by medicine. Trouble walking or weakness in your arms and legs. Clear or bloody fluid coming from your nose or ears. Changes in how you see (vision). A seizure. More confusion or more grumpy moods. Your symptoms get worse. You are sleepier than normal and have trouble staying awake. You lose your balance. The black centers of your eyes (pupils) change in size. Your speech is slurred. Your dizziness gets worse. You vomit. These symptoms may be an emergency. Do not wait to see if the symptoms will go away. Get medical help right away. Call your local emergency services (911 in the [place] Do not drive yourself to the hospital.

## 2022-05-20 NOTE — ED Provider Notes (Signed)
[facility] EMERGENCY DEPARTMENT Provider Note   CSN: [id number] Arrival date & time: 05/20/22  1113     History Chief Complaint  Patient presents with   Samantha Jenkins is a 81 y.o. female with history of hypertension, hyperlipidemia presents to the ED for evaluation of head wound and neck pain after a mechanical fall today.  Earlier this morning, the patient reports that she was getting out of bed and went to put 1 hand on her walker to help get herself up when the walker flipped and she fell.  She reports that she thinks she hit the right side of her head on the corner of her nightstand and also scraped her right elbow as well.  She denies any chest pain, abdominal pain, hip pain, leg pain, hand pain, or wrist pain.  She denies any back pain.  Denies any LOC.  Patient reports that she was walking afterwards.  She denies any blood thinner use.  She denies any dizziness or any lightheadedness. Denies any unilateral weakness, numbness, tingling.    Fall Associated symptoms include headaches. Pertinent negatives include no chest pain, no abdominal pain and no shortness of breath.       Home Medications Prior to Admission medications   Medication Sig Start Date End Date Taking? Authorizing Provider  albuterol (PROVENTIL HFA;VENTOLIN HFA) 108 (90 BASE) MCG/ACT inhaler Inhale 2 puffs into the lungs every 6 (six) hours as needed for wheezing or shortness of breath. 03/04/14   Denita Lung, MD  bacitracin-polymyxin b (POLYSPORIN) ointment Apply 1 application topically daily as needed (wound care).    [provider]  Bismuth Subsalicylate (PEPTO-BISMOL) 262 MG TABS Take 262 mg by mouth 2 (two) times daily as needed (indigestion).    [provider]  bisoprolol-hydrochlorothiazide (ZIAC) 5-6.25 MG tablet TAKE 1 TABLET BY MOUTH DAILY. Patient taking differently: Take 1 tablet by mouth at bedtime. 04/04/16   Denita Lung, MD   diphenhydramine-acetaminophen (TYLENOL PM) 25-500 MG TABS tablet Take 1-2 tablets by mouth at bedtime as needed (sleep).    [provider]  Polyethyl Glycol-Propyl Glycol (SYSTANE OP) Place 1 drop into both eyes daily as needed (dry eyes).    [provider]  pravastatin (PRAVACHOL) 40 MG tablet TAKE 1 TABLET EVERY DAY Patient taking differently: Take 40 mg by mouth every evening. 04/04/16   Denita Lung, MD  sertraline (ZOLOFT) 100 MG tablet TAKE 1 TABLET EVERY DAY Patient taking differently: Take 150 mg by mouth at bedtime. 04/04/16   Denita Lung, MD  sodium chloride (OCEAN) 0.65 % nasal spray Place 1 spray into the nose as needed for congestion.    [provider]      Allergies    Statins and Sulfa antibiotics    Review of Systems   Review of Systems  Constitutional:  Negative for chills and fever.  Respiratory:  Negative for shortness of breath.   Cardiovascular:  Negative for chest pain and palpitations.  Gastrointestinal:  Negative for abdominal pain, nausea and vomiting.  Musculoskeletal:  Positive for arthralgias and myalgias.  Neurological:  Positive for headaches. Negative for weakness and numbness.    Physical Exam Updated Vital Signs BP (!) 153/69 (BP Location: Left Arm)   Pulse (!) 56   Temp (!) 97.5 F (36.4 C)   Resp 14   SpO2 97%  Physical Exam Vitals and nursing note reviewed.  Constitutional:      General: She is  not in acute distress.    Appearance: Normal appearance. She is not ill-appearing or toxic-appearing.  HENT:     Head: Normocephalic.     Comments: Small laceration noted superiorly and just posteriorly from the right ear. Mild tenderness over the laceration. No step off or deformities. Bleeding is controlled. No battle signs or raccoon eyes.     Mouth/Throat:     Mouth: Mucous membranes are moist.  Eyes:     General: No scleral icterus.    Extraocular Movements: Extraocular movements intact.     Pupils: Pupils  are equal, round, and reactive to light.  Neck:     Comments: Full ROM. No paraspinal or midline cervical tenderness. No step offs or deformities. No signs of trauma. No overlying skin changes noted.  Cardiovascular:     Rate and Rhythm: Regular rhythm. Bradycardia present.  Pulmonary:     Effort: Pulmonary effort is normal. No respiratory distress.     Comments: Very mild end expiratory wheezing heard bilateral, history of asthma. She denies any SOB.  No accessory muscle use, nasal flaring, tripoding, respiratory distress, or cyanosis present.  Patient is satting well on room air without any increased work of breathing.  Speaking in full sentences with ease. Chest:     Chest wall: No tenderness.  Abdominal:     General: Bowel sounds are normal.     Palpations: Abdomen is soft.     Tenderness: There is no abdominal tenderness. There is no guarding or rebound.  Musculoskeletal:        General: Tenderness present. No deformity.     Cervical back: Normal range of motion. No tenderness.     Comments: Superficial skin tear noted to the right elbow.  Bleeding controlled.  Mild tenderness.  Patient has full range of motion of elbow.  She has no tenderness otherwise to her upper and lower extremities bilaterally.  Compartments are soft.  She is palpable pulses.  Sensation intact throughout.  Strength is 5-5 in patient's upper and lower bilateral extremities.  No snuffbox tenderness bilaterally.  Cap refill intact.  No midline or paraspinal cervical, thoracic, or lumbar tenderness palpation.  No step-offs or deformities noted.  Skin:    General: Skin is warm and dry.  Neurological:     General: No focal deficit present.     Mental Status: She is alert. Mental status is at baseline.     GCS: GCS eye subscore is 4. GCS verbal subscore is 5. GCS motor subscore is 6.     Cranial Nerves: No cranial nerve deficit, dysarthria or facial asymmetry.     Sensory: No sensory deficit.     Motor: No weakness or  pronator drift.     ED Results / Procedures / Treatments   Labs (all labs ordered are listed, but only abnormal results are displayed) Labs Reviewed  COMPREHENSIVE METABOLIC PANEL - Abnormal; Notable for the following components:      Result Value   Glucose, Bld 101 (*)    Creatinine, Ser 1.05 (*)    GFR, Estimated 53 (*)    All other components within normal limits  CBC    EKG None  Radiology DG Elbow Complete Right  Result Date: 05/20/2022 CLINICAL DATA:  Fall.  Laceration. EXAM: RIGHT ELBOW - COMPLETE 3+ VIEW COMPARISON:  None Available. FINDINGS: No evidence of fracture of the ulna or humerus. The radial head is normal. No joint effusion. IMPRESSION: No fracture or dislocation. Electronically Signed   By: Nicole Kindred  Leonia Reeves M.D.   On: 05/20/2022 14:22   CT Cervical Spine Wo Contrast  Result Date: 05/20/2022 CLINICAL DATA:  Fall.  Neck trauma (Age >= 65y) EXAM: CT CERVICAL SPINE WITHOUT CONTRAST TECHNIQUE: Multidetector CT imaging of the cervical spine was performed without intravenous contrast. Multiplanar CT image reconstructions were also generated. RADIATION DOSE REDUCTION: This exam was performed according to the departmental dose-optimization program which includes automated exposure control, adjustment of the mA and/or kV according to patient size and/or use of iterative reconstruction technique. COMPARISON:  None Available. FINDINGS: Alignment: Straightening of the cervical spine. No facet subluxation. Dens is well positioned between the lateral masses of C1. Skull base and vertebrae: No acute fracture. No primary bone lesion or focal pathologic process. Soft tissues and spinal canal: No prevertebral edema. No visible canal hematoma. Disc levels: Moderate to marked multilevel cervical degenerative disc disease, most prominent at C6-7. Advanced bilateral cervical facet arthropathy, asymmetric to the left. Mild degenerative foraminal stenosis bilaterally at C6-7. Upper chest: No acute  abnormality. Other: Visualized mastoid air cells appear clear. No discrete thyroid nodules. No pathologically enlarged cervical nodes. IMPRESSION: 1. No cervical spine fracture or subluxation. 2. Moderate to marked multilevel cervical degenerative changes as described. Electronically Signed   By: Ilona Sorrel M.D.   On: 05/20/2022 14:18   CT Head Wo Contrast  Result Date: 05/20/2022 CLINICAL DATA:  Fall.  Head trauma, minor (Age >= 65y) EXAM: CT HEAD WITHOUT CONTRAST TECHNIQUE: Contiguous axial images were obtained from the base of the skull through the vertex without intravenous contrast. RADIATION DOSE REDUCTION: This exam was performed according to the departmental dose-optimization program which includes automated exposure control, adjustment of the mA and/or kV according to patient size and/or use of iterative reconstruction technique. COMPARISON:  None Available. FINDINGS: Brain: Nonspecific moderate subcortical and periventricular white matter hypodensity, most in keeping with chronic small vessel ischemic change. No evidence of parenchymal hemorrhage or extra-axial fluid collection. No mass lesion, mass effect, or midline shift. No CT evidence of acute infarction. Cerebral volume is age appropriate. No ventriculomegaly. Vascular: No acute abnormality. Skull: No evidence of calvarial fracture. Sinuses/Orbits: The visualized paranasal sinuses are essentially clear. Other:  The mastoid air cells are unopacified. IMPRESSION: 1. No evidence of acute intracranial abnormality. No evidence of calvarial fracture. 2. Moderate chronic small vessel ischemic changes in the cerebral white matter. Electronically Signed   By: Ilona Sorrel M.D.   On: 05/20/2022 14:13    Procedures Procedures   Medications Ordered in ED Medications  Tdap (BOOSTRIX) injection 0.5 mL (has no administration in time range)    ED Course/ Medical Decision Making/ A&P                           Medical Decision Making Amount and/or  Complexity of Data Reviewed Labs: ordered. Radiology: ordered.  Risk OTC drugs. Prescription drug management.    81 year old female presents emerged department for evaluation of head and neck pain after fall.  Differential diagnosis includes but is not limited to laceration, head bleed, skull fracture, cervical fracture, cervical subluxation, cervical strain, cervical sprain.  Vitals show mildly high blood pressure 123/69, slightly decreased temperature 97.5.  Patient bradycardic with longstanding history of bradycardia.  Satting well on room air without increased work of breathing.  Physical exam as noted above.  X-ray of the right elbow shows no evidence any fracture of the ulnar humerus.  The radial head is normal.  No joint effusion.  No fracture or dislocation.  CT of the cervical spine shows no cervical spine fracture or subluxation.  There is moderate to marked multilevel cervical degenerative changes as described.  CT of the head shows no evidence of acute intercranial abnormality.  No evidence of any calvarial fracture.  Moderate chronic small vessel ischemic changes seen in the cerebral white matter.  We will update tetanus given the patient's laceration.  I independently reviewed and interpreted the patient's labs.  CMP shows mildly elevated glucose at 101.  Creatinine at baseline at 1.05.  No leukocytosis or anemia seen on CBC.  After blood was cleaned up, the patient has a very superficial abrasion noted to the scalp approximately 1.5cm long. Bleeding is controlled. I do not think this needs any suturing or staples.  The patient is ambulatory with minimal assistance. She reports no new pain other than her unchanged chronic pain. Her imaging is reassuring and the patient reports she feels fine. Given this, I do not think the patient has a medical need for admission and can be discharged home. I discussed the labs and imaging with the patient and son at bedside. We discussed keeping  the abrasion on her scalp clean. Recommended follow up with her PCP this up coming week for re-evaluation. I discussed strict return precautions and red flag symptoms with the patient and son at bedside. They verbalized understanding anf agree to the plan. The patient is being discharged home in good condition.   I discussed this case with my attending physician who cosigned this note including patient's presenting symptoms, physical exam, and planned diagnostics and interventions. Attending physician stated agreement with plan or made changes to plan which were implemented.   Final Clinical Impression(s) / ED Diagnoses Final diagnoses:  Fall, initial encounter  Laceration of scalp, initial encounter  Neck pain    Rx / DC Orders ED Discharge Orders          Ordered    lidocaine (HM LIDOCAINE PATCH) 4 %  Every 24 hours        05/20/22 1559              Sherrell Puller, PA-C 05/21/22 1625    Jeanell Sparrow, DO 05/22/22 [phone]

## 2022-05-20 NOTE — ED Triage Notes (Signed)
Pt here after mechanical fall, in which she hit her head. Denies LOC. Lac to posterior head. Pt not on anticoags.

## 2022-05-27 DIAGNOSIS — N130 Hydronephrosis with ureteropelvic junction obstruction: Secondary | ICD-10-CM | POA: Diagnosis not present

## 2022-05-27 DIAGNOSIS — R3915 Urgency of urination: Secondary | ICD-10-CM | POA: Diagnosis not present

## 2022-07-15 DIAGNOSIS — E7800 Pure hypercholesterolemia, unspecified: Secondary | ICD-10-CM | POA: Diagnosis not present

## 2022-07-15 DIAGNOSIS — F324 Major depressive disorder, single episode, in partial remission: Secondary | ICD-10-CM | POA: Diagnosis not present

## 2022-07-15 DIAGNOSIS — I10 Essential (primary) hypertension: Secondary | ICD-10-CM | POA: Diagnosis not present

## 2022-07-15 DIAGNOSIS — M8588 Other specified disorders of bone density and structure, other site: Secondary | ICD-10-CM | POA: Diagnosis not present

## 2022-07-15 DIAGNOSIS — M7989 Other specified soft tissue disorders: Secondary | ICD-10-CM | POA: Diagnosis not present

## 2022-07-15 DIAGNOSIS — J45909 Unspecified asthma, uncomplicated: Secondary | ICD-10-CM | POA: Diagnosis not present

## 2022-07-29 DIAGNOSIS — Z96652 Presence of left artificial knee joint: Secondary | ICD-10-CM | POA: Diagnosis not present

## 2022-07-29 DIAGNOSIS — M1612 Unilateral primary osteoarthritis, left hip: Secondary | ICD-10-CM | POA: Diagnosis not present

## 2022-08-17 DIAGNOSIS — Z23 Encounter for immunization: Secondary | ICD-10-CM | POA: Diagnosis not present

## 2022-09-20 DIAGNOSIS — G5620 Lesion of ulnar nerve, unspecified upper limb: Secondary | ICD-10-CM | POA: Diagnosis not present

## 2022-09-20 DIAGNOSIS — R197 Diarrhea, unspecified: Secondary | ICD-10-CM | POA: Diagnosis not present

## 2022-09-28 DIAGNOSIS — M25552 Pain in left hip: Secondary | ICD-10-CM | POA: Diagnosis not present

## 2022-10-17 DIAGNOSIS — E559 Vitamin D deficiency, unspecified: Secondary | ICD-10-CM | POA: Diagnosis not present

## 2022-12-03 DIAGNOSIS — H43811 Vitreous degeneration, right eye: Secondary | ICD-10-CM | POA: Diagnosis not present

## 2023-04-04 ENCOUNTER — Telehealth: Payer: Self-pay | Admitting: Physician Assistant

## 2023-04-04 NOTE — Telephone Encounter (Signed)
Patient's PCP called on patient's behalf stating that the patient is having blood in her stool. States she is having hip surgery soon and will be out of commission for 12 weeks. Dr. Lewie Chamber is wondering if anything could be done sooner than 07/26 since the patient is needing hip surgery. Phone number to call back on is [phone]. Please advise, thank you.

## 2023-04-08 NOTE — Telephone Encounter (Signed)
Returned call to [facility] and spoke with Lafonda Mosses. Patient's appt has been rescheduled to Tuesday, 04/15/23 at 11 am with Alcide Evener, NP. Pt is to arrive by 10:45 am for check in. Lafonda Mosses verbalized understanding and had no concerns at the end of the call.

## 2023-04-15 ENCOUNTER — Encounter: Payer: Self-pay | Admitting: Nurse Practitioner

## 2023-04-15 ENCOUNTER — Ambulatory Visit: Payer: PPO | Admitting: Nurse Practitioner

## 2023-04-15 VITALS — BP 140/76 | Ht 64.0 in | Wt 184.6 lb

## 2023-04-15 DIAGNOSIS — R194 Change in bowel habit: Secondary | ICD-10-CM

## 2023-04-15 DIAGNOSIS — K625 Hemorrhage of anus and rectum: Secondary | ICD-10-CM | POA: Diagnosis not present

## 2023-04-15 NOTE — Progress Notes (Signed)
04/15/2023 Samantha Jenkins [id number] 07/27/1941   CHIEF COMPLAINT: Rectal bleeding   HISTORY OF PRESENT ILLNESS: Samantha Jenkins is an 82 year old female with a past medical history of anxiety, depression, arthritis, hypertension, hyperlipidemia, LBBB and kidney stones. Past right and left knee replacement surgery. She presents to our office today as referred by Dr. Irven Coe for further evaluation regarding rectal bleeding. She describes seeing a small amount of bright red blood on the toilet tissue after passing a normal bowel movement on several occasions over the past 4 to 6 weeks.  A few times, she saw a moderate amount of bright red blood on the toilet tissue and on 1 occasion she describes seeing a ropelike streak of blood in the toilet water.  She sometimes has alternating bowel pattern with constipation and loose stools.  She denies having any associated anorectal pain.  She has intermittent mild discomfort to the left mid to lower abdomen which she first noticed 1 or 2 weeks ago.  She denies having any severe abdominal pain.  She underwent a colonoscopy by GI in [place] at least 20 years ago which she reported was normal.  She is planning on having left hip replacement surgery in the near future and she does not wish to pursue a colonoscopy prior to this surgery.  She reported undergoing a recent rectal exam by Dr. Lewie Chamber which was was unrevealing.  No known family history of colon polyps or colorectal cancer.  Labs 03/18/2023: WBC 7.7.  Hemoglobin 13.7.  Hematocrit 41.6.  MCV 95.  Platelet 228.     Latest Ref Rng & Units 05/20/2022   11:13 AM 07/18/2021    3:39 AM 07/04/2021   11:08 AM  CBC  WBC 4.0 - 10.5 K/uL 8.4  10.3  6.2   Hemoglobin 12.0 - 15.0 g/dL 78.2  95.6  21.3   Hematocrit 36.0 - 46.0 % 44.2  31.6  40.4   Platelets 150 - 400 K/uL 194  169  213        Latest Ref Rng & Units 05/20/2022   11:13 AM 07/18/2021    3:39 AM 07/04/2021   11:08 AM  CMP  Glucose 70 - 99 mg/dL 086   578  469   BUN 8 - 23 mg/dL 11  16  18    Creatinine 0.44 - 1.00 mg/dL 6.29  5.28  4.13   Sodium 135 - 145 mmol/L 140  138  140   Potassium 3.5 - 5.1 mmol/L 4.2  3.6  3.4   Chloride 98 - 111 mmol/L 104  107  109   CO2 22 - 32 mmol/L 25  23  24    Calcium 8.9 - 10.3 mg/dL 9.8  8.8  9.5   Total Protein 6.5 - 8.1 g/dL 6.7   7.1   Total Bilirubin 0.3 - 1.2 mg/dL 0.9   1.2   Alkaline Phos 38 - 126 U/L 98   100   AST 15 - 41 U/L 33   23   ALT 0 - 44 U/L 17   15      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   Depression    Dysthymia    FHx: cardiovascular disease    History of blood transfusion as child   History of  kidney stones    Hyperlipidemia    Hypertension    LBBB (left bundle branch block) 2014   on ekg   Obesity    Pneumonia    hx of as a child   Vitamin D deficiency    Past Surgical History:  Procedure Laterality Date   ABDOMINAL HYSTERECTOMY  1986   partial   APPENDECTOMY  1986   with hysterectomy   CATARACT EXTRACTION Bilateral 2016   CYSTOSCOPY/URETEROSCOPY/HOLMIUM LASER/STENT PLACEMENT Left 02/09/2021   Procedure: LEFT URETEROSCOPY/ LASER LITHOTRIPSY  AND  STENT PLACEMENT,ENDOPYLOTOMY;  Surgeon: Crist Fat, MD;  Location: [facility];  Service: Urology;  Laterality: Left;   CYSTOSCOPY/URETEROSCOPY/HOLMIUM LASER/STENT PLACEMENT Left 03/15/2021   Procedure: CYSTOSCOPY, LEFT URETEROSCOPY, HOLMIUM LASER ENDOPYELOTOMY, URETERAL STENT PLACEMENT;  Surgeon: Crist Fat, MD;  Location: [facility];  Service: Urology;  Laterality: Left;   EYE SURGERY  2016   bil cataracts   HOLMIUM LASER APPLICATION Left 02/09/2021   Procedure: HOLMIUM LASER APPLICATION;  Surgeon: Crist Fat, MD;  Location[facility];  Service: Urology;  Laterality: Left;   KNEE ARTHROSCOPY  1995   left   NEPHROLITHOTOMY Left 03/23/2020    Procedure: LEFT NEPHROLITHOTOMY PERCUTANEOUS WITH SURGEON ACCESS;  Surgeon: Crist Fat, MD;  Location: [facility];  Service: Urology;  Laterality: Left;   TONSILLECTOMY  1945   adenoids removed   TOTAL KNEE ARTHROPLASTY Right 06/06/2020   Procedure: TOTAL KNEE ARTHROPLASTY;  Surgeon: Durene Romans, MD;  Location: [facility];  Service: Orthopedics;  Laterality: Right;  70 mins   TOTAL KNEE ARTHROPLASTY Left 07/17/2021   Procedure: TOTAL KNEE ARTHROPLASTY;  Surgeon: Durene Romans, MD;  Location: [facility];  Service: Orthopedics;  Laterality: Left;   TUBAL LIGATION  1979   Social History: She is divorced.  She has 2 sons.  She stopped smoking cigarettes 55 years ago. She drinks 2  (4oz) glasses of wine or 1.5 oz vodka once weekly. No drug use.   Family History: Mother had  breast cancer. No known family history of esophageal, gastric or colon cancer.   Allergies  Allergen Reactions   Statins     Muscle aches can take pravastatin   Sulfa Antibiotics Other (See Comments)    Blood in urine      Outpatient Encounter Medications as of 04/15/2023  Medication Sig   albuterol (PROVENTIL HFA;VENTOLIN HFA) 108 (90 BASE) MCG/ACT inhaler Inhale 2 puffs into the lungs every 6 (six) hours as needed for wheezing or shortness of breath.   bacitracin-polymyxin b (POLYSPORIN) ointment Apply 1 application topically daily as needed (wound care).   Bismuth Subsalicylate (PEPTO-BISMOL) 262 MG TABS Take 262 mg by mouth 2 (two) times daily as needed (indigestion).   bisoprolol-hydrochlorothiazide (ZIAC) 5-6.25 MG tablet TAKE 1 TABLET BY MOUTH DAILY. (Patient taking differently: Take 1 tablet by mouth at bedtime.)   diphenhydramine-acetaminophen (TYLENOL PM) 25-500 MG TABS tablet Take 1-2 tablets by mouth at bedtime as needed (sleep).   lidocaine (HM LIDOCAINE PATCH) 4 % Place 1 patch onto the skin daily.   Polyethyl Glycol-Propyl Glycol (SYSTANE OP) Place 1 drop into both eyes daily as needed (dry eyes).    pravastatin (PRAVACHOL) 40 MG tablet TAKE 1 TABLET EVERY DAY (Patient taking differently: Take 40 mg by mouth every evening.)   sertraline (ZOLOFT) 100 MG tablet TAKE 1 TABLET EVERY DAY (Patient taking differently: Take 150 mg by mouth at bedtime.)   sodium chloride (OCEAN) 0.65 % nasal spray Place 1  spray into the nose as needed for congestion.   No facility-administered encounter medications on file as of 04/15/2023.    REVIEW OF SYSTEMS:  Gen: Denies fever, sweats or chills. No weight loss.  CV: Denies chest pain, palpitations or edema. Resp: Denies cough, shortness of breath of hemoptysis.  GI: See HPI. No GERD symptoms.  GU : Denies urinary burning, blood in urine, increased urinary frequency or incontinence. MS: Denies joint pain, muscles aches or weakness. Derm: Denies rash, itchiness, skin lesions or unhealing ulcers. Psych: Denies depression, anxiety, memory loss or confusion. Heme: Denies bruising, easy bleeding. Neuro:  Denies headaches, dizziness or paresthesias. Endo:  Denies any problems with DM, thyroid or adrenal function.  PHYSICAL EXAM: BP (!) 140/76   Ht 5\' 4"  (1.626 m)   Wt 184 lb 9.6 oz (83.7 kg)   BMI 31.69 kg/m   General: 82 year old female in no acute distress. Head: Normocephalic and atraumatic. Eyes:  Sclerae non-icteric, conjunctive pink. Ears: Normal auditory acuity. Mouth: Dentition intact. No ulcers or lesions.  Neck: Supple, no lymphadenopathy or thyromegaly.  Lungs: Clear bilaterally to auscultation without wheezes, crackles or rhonchi. Heart: Regular rate and rhythm. No murmur, rub or gallop appreciated.  Abdomen: Soft, nontender, nondistended. No masses. No hepatosplenomegaly. Normoactive bowel sounds x 4 quadrants.  Rectal: Patient declined rectal exam. Musculoskeletal: Symmetrical with no gross deformities. Skin: Warm and dry. No rash or lesions on visible extremities. Extremities: No edema. Neurological: Alert oriented x 4, no focal  deficits.  Psychological:  Alert and cooperative. Normal mood and affect.  ASSESSMENT AND PLAN:  82 year old female with with intermittent bright red rectal bleeding x 4 to 6 months. No associated anorectal pain. Colonoscopy 20+ years ago reported as normal.  -Colonoscopy benefits and risks discussed including risk with sedation, risk of bleeding, perforation and infection  -Patient did not wish to pursue a diagnostic colonoscopy prior to her upcoming left hip surgery -She will contact our office if her rectal bleeding persists or worsens -She will contact our office when she is ready to schedule colonoscopy, no earlier than 3 months status post left hip replacement surgery -Apply a small amount of Desitin inside the anal opening and to the external anal area three times daily as needed for anal or hemorrhoidal irritation/bleeding.   Alternating constipation/loose stools -Benefiber QD as tolerated -See plan above   Left mid/LLQ discomfort x 1 to 2 weeks. Negative abdominal exam.  -Patient contact office if left mid/LLQ pain recurs -Recommend CTAP if abdominal pain recurs     CC:  Irven Coe, MD

## 2023-04-15 NOTE — Progress Notes (Signed)
I left Mrs Kanda a detailed voice mail message that Dr Yancey Flemings will gladly be her GI Doctor.

## 2023-04-15 NOTE — Patient Instructions (Addendum)
_______________________________________________________  If your blood pressure at your visit was 140/90 or greater, please contact your primary care physician to follow up on this.  _______________________________________________________  If you are age 83 or older, your body mass index should be between 23-30. Your Body mass index is 31.69 kg/m. If this is out of the aforementioned range listed, please consider follow up with your Primary Care Provider.  If you are age 72 or younger, your body mass index should be between 19-25. Your Body mass index is 31.69 kg/m. If this is out of the aformentioned range listed, please consider follow up with your Primary Care Provider.    The [place] [facility] providers would like to encourage you to use [facility] to communicate with providers for non-urgent requests or questions.  Due to long hold times on the telephone, sending your provider a message by [facility] may be a faster and more efficient way to get a response.  Please allow 48 business hours for a response.  Please remember that this is for non-urgent requests.   Benefiber one tablespoon once daily   Apply a small amount of Desitin inside the anal opening and to the external anal area three times daily as needed for anal or hemorrhoidal irritation/bleeding.   Contact our office when you are ready to schedule a colonoscopy   Contact our office if your rectal bleeding worsens   It was a pleasure to see you today!  Thank you for trusting me with your gastrointestinal care!

## 2023-04-15 NOTE — Progress Notes (Signed)
DD, pls contact patient and let her know Dr. Marina Goodell will gladly be her primary GI. THX.

## 2023-04-15 NOTE — Progress Notes (Signed)
I am happy to see her. Thanks

## 2023-04-25 ENCOUNTER — Encounter (HOSPITAL_COMMUNITY): Payer: Self-pay

## 2023-05-20 NOTE — Progress Notes (Signed)
COVID Vaccine Completed: yes  Date of COVID positive in last 90 days:  PCP - Irven Coe, MD Cardiologist - Zoila Shutter, MD LOV 2014 for LBB and HTN  Medical/cardiac clearance by Dr. Lewie Chamber 04/25/23 on chart  Chest x-ray -  EKG -  Stress Test -  ECHO -  Cardiac Cath -  Pacemaker/ICD device last checked: Spinal Cord Stimulator:  Bowel Prep -   Sleep Study -  CPAP -   Fasting Blood Sugar -  Checks Blood Sugar _____ times a day  Last dose of GLP1 agonist-  N/A GLP1 instructions:  N/A   Last dose of SGLT-2 inhibitors-  N/A SGLT-2 instructions: N/A   Blood Thinner Instructions:  Time Aspirin Instructions: Last Dose:  Activity level:  Can go up a flight of stairs and perform activities of daily living without stopping and without symptoms of chest pain or shortness of breath.  Able to exercise without symptoms  Unable to go up a flight of stairs without symptoms of     Anesthesia review: HTN, LBBB, asthma  Patient denies shortness of breath, fever, cough and chest pain at PAT appointment  Patient verbalized understanding of instructions that were given to them at the PAT appointment. Patient was also instructed that they will need to review over the PAT instructions again at home before surgery.

## 2023-05-20 NOTE — Patient Instructions (Signed)
SURGICAL WAITING ROOM VISITATION  Patients having surgery or a procedure may have no more than 2 support people in the waiting area - these visitors may rotate.    Children under the age of 16 must have an adult with them who is not the patient.  Due to an increase in RSV and influenza rates and associated hospitalizations, children ages 12 and under may not visit patients in [place] hospitals.  If the patient needs to stay at the hospital during part of their recovery, the visitor guidelines for inpatient rooms apply. Pre-op nurse will coordinate an appropriate time for 1 support person to accompany patient in pre-op.  This support person may not rotate.    Please refer to the [place] website for the visitor guidelines for Inpatients (after your surgery is over and you are in a regular room).    Your procedure is scheduled on: 06/03/23   Report to [facility] Main Entrance    Report to admitting at 9:00 AM   Call this number if you have problems the morning of surgery [phone]   Do not eat food :After Midnight.   After Midnight you may have the following liquids until 8:30 AM DAY OF SURGERY  Water Non-Citrus Juices (without pulp, NO RED-Apple, White grape, White cranberry) Black Coffee (NO MILK/CREAM OR CREAMERS, sugar ok)  Clear Tea (NO MILK/CREAM OR CREAMERS, sugar ok) regular and decaf                             Plain Jell-O (NO RED)                                           Fruit ices (not with fruit pulp, NO RED)                                     Popsicles (NO RED)                                                               Sports drinks like Gatorade (NO RED)    The day of surgery:  Drink ONE (1) Pre-Surgery Clear Ensure at 8:30 AM the morning of surgery. Drink in one sitting. Do not sip.  This drink was given to you during your hospital  pre-op appointment visit. Nothing else to drink after completing the  Pre-Surgery Clear Ensure.          If  you have questions, please contact your surgeon's office.   FOLLOW BOWEL PREP AND ANY ADDITIONAL PRE OP INSTRUCTIONS YOU RECEIVED FROM YOUR SURGEON'S OFFICE!!!     Oral Hygiene is also important to reduce your risk of infection.                                    Remember - BRUSH YOUR TEETH THE MORNING OF SURGERY WITH YOUR REGULAR TOOTHPASTE  DENTURES WILL BE REMOVED PRIOR TO SURGERY PLEASE DO NOT APPLY "Poly grip" OR ADHESIVES!!!     Do NOT smoke after Midnight   Take these medicines the morning of surgery with A SIP OF WATER: Amlodipine, Zetia, Norco, Pravastatin, Pregabalin                              You may not have any metal on your body including hair pins, jewelry, and body piercing             Do not wear make-up, lotions, powders, perfumes, or deodorant  Do not wear nail polish including gel and S&S, artificial/acrylic nails, or any other type of covering on natural nails including finger and toenails. If you have artificial nails, gel coating, etc. that needs to be removed by a nail salon please have this removed prior to surgery or surgery may need to be canceled/ delayed if the surgeon/ anesthesia feels like they are unable to be safely monitored.   Do not shave  48 hours prior to surgery.    Do not bring valuables to the hospital. [facility] IS NOT             RESPONSIBLE   FOR VALUABLES.   Contacts, glasses, dentures or bridgework may not be worn into surgery.   Bring small overnight bag day of surgery.   DO NOT BRING YOUR HOME MEDICATIONS TO THE HOSPITAL. PHARMACY WILL DISPENSE MEDICATIONS LISTED ON YOUR MEDICATION LIST TO YOU DURING YOUR ADMISSION IN THE HOSPITAL!   Special Instructions: Bring a copy of your healthcare power of attorney and living will documents the day of surgery if you haven't scanned them before.              Please read over the following fact sheets you were given: IF YOU HAVE QUESTIONS ABOUT YOUR PRE-OP INSTRUCTIONS PLEASE CALL [phone]-  Quindon Denker   If you received a COVID test during your pre-op visit  it is requested that you wear a mask when out in public, stay away from anyone that may not be feeling well and notify your surgeon if you develop symptoms. If you test positive for Covid or have been in contact with anyone that has tested positive in the last 10 days please notify you surgeon.      Pre-operative 5 CHG Bath Instructions   You can play a key role in reducing the risk of infection after surgery. Your skin needs to be as free of germs as possible. You can reduce the number of germs on your skin by washing with CHG (chlorhexidine gluconate) soap before surgery. CHG is an antiseptic soap that kills germs and continues to kill germs even after washing.   DO NOT use if you have an allergy to chlorhexidine/CHG or antibacterial soaps. If your skin becomes reddened or irritated, stop using the CHG and notify one of our RNs at [phone].   Please shower with the CHG soap starting 4 days before surgery using the following schedule:     Please keep in mind the following:  DO NOT shave, including legs and underarms, starting the day of your first shower.   You may shave your face at any point before/day of surgery.  Place clean sheets on your bed the day you start using CHG soap. Use a clean washcloth (not used since being washed) for each shower. DO NOT sleep with pets once you start using the CHG.   CHG Shower Instructions:  If you choose to wash your hair and private area, wash first   with your normal shampoo/soap.  After you use shampoo/soap, rinse your hair and body thoroughly to remove shampoo/soap residue.  Turn the water OFF and apply about 3 tablespoons (45 ml) of CHG soap to a CLEAN washcloth.  Apply CHG soap ONLY FROM YOUR NECK DOWN TO YOUR TOES (washing for 3-5 minutes)  DO NOT use CHG soap on face, private areas, open wounds, or sores.  Pay special attention to the area where your surgery is being  performed.  If you are having back surgery, having someone wash your back for you may be helpful. Wait 2 minutes after CHG soap is applied, then you may rinse off the CHG soap.  Pat dry with a clean towel  Put on clean clothes/pajamas   If you choose to wear lotion, please use ONLY the CHG-compatible lotions on the back of this paper.     Additional instructions for the day of surgery: DO NOT APPLY any lotions, deodorants, cologne, or perfumes.   Put on clean/comfortable clothes.  Brush your teeth.  Ask your nurse before applying any prescription medications to the skin.      CHG Compatible Lotions   Aveeno Moisturizing lotion  Cetaphil Moisturizing Cream  Cetaphil Moisturizing Lotion  Clairol Herbal Essence Moisturizing Lotion, Dry Skin  Clairol Herbal Essence Moisturizing Lotion, Extra Dry Skin  Clairol Herbal Essence Moisturizing Lotion, Normal Skin  Curel Age Defying Therapeutic Moisturizing Lotion with Alpha Hydroxy  Curel Extreme Care Body Lotion  Curel Soothing Hands Moisturizing Hand Lotion  Curel Therapeutic Moisturizing Cream, Fragrance-Free  Curel Therapeutic Moisturizing Lotion, Fragrance-Free  Curel Therapeutic Moisturizing Lotion, Original Formula  Eucerin Daily Replenishing Lotion  Eucerin Dry Skin Therapy Plus Alpha Hydroxy Crme  Eucerin Dry Skin Therapy Plus Alpha Hydroxy Lotion  Eucerin Original Crme  Eucerin Original Lotion  Eucerin Plus Crme Eucerin Plus Lotion  Eucerin TriLipid Replenishing Lotion  Keri Anti-Bacterial Hand Lotion  Keri Deep Conditioning Original Lotion Dry Skin Formula Softly Scented  Keri Deep Conditioning Original Lotion, Fragrance Free Sensitive Skin Formula  Keri Lotion Fast Absorbing Fragrance Free Sensitive Skin Formula  Keri Lotion Fast Absorbing Softly Scented Dry Skin Formula  Keri Original Lotion  Keri Skin Renewal Lotion Keri Silky Smooth Lotion  Keri Silky Smooth Sensitive Skin Lotion  Nivea Body Creamy Conditioning  Oil  Nivea Body Extra Enriched Lotion  Nivea Body Original Lotion  Nivea Body Sheer Moisturizing Lotion Nivea Crme  Nivea Skin Firming Lotion  NutraDerm 30 Skin Lotion  NutraDerm Skin Lotion  NutraDerm Therapeutic Skin Cream  NutraDerm Therapeutic Skin Lotion  ProShield Protective Hand Cream  Provon moisturizing lotion   Incentive Spirometer  An incentive spirometer is a tool that can help keep your lungs clear and active. This tool measures how well you are filling your lungs with each breath. Taking long deep breaths may help reverse or decrease the chance of developing breathing (pulmonary) problems (especially infection) following: A long period of time when you are unable to move or be active. BEFORE THE PROCEDURE  If the spirometer includes an indicator to show your best effort, your nurse or respiratory therapist will set it to a desired goal. If possible, sit up straight or lean slightly forward. Try not to slouch. Hold the incentive spirometer in an upright position. INSTRUCTIONS FOR USE  Sit on the edge of your bed if possible, or sit up as far as you can in bed or on a chair. Hold the incentive spirometer in an upright position. Breathe out normally. Place the   mouthpiece in your mouth and seal your lips tightly around it. Breathe in slowly and as deeply as possible, raising the piston or the ball toward the top of the column. Hold your breath for 3-5 seconds or for as long as possible. Allow the piston or ball to fall to the bottom of the column. Remove the mouthpiece from your mouth and breathe out normally. Rest for a few seconds and repeat Steps 1 through 7 at least 10 times every 1-2 hours when you are awake. Take your time and take a few normal breaths between deep breaths. The spirometer may include an indicator to show your best effort. Use the indicator as a goal to work toward during each repetition. After each set of 10 deep breaths, practice coughing to be sure  your lungs are clear. If you have an incision (the cut made at the time of surgery), support your incision when coughing by placing a pillow or rolled up towels firmly against it. Once you are able to get out of bed, walk around indoors and cough well. You may stop using the incentive spirometer when instructed by your caregiver.  RISKS AND COMPLICATIONS Take your time so you do not get dizzy or light-headed. If you are in pain, you may need to take or ask for pain medication before doing incentive spirometry. It is harder to take a deep breath if you are having pain. AFTER USE Rest and breathe slowly and easily. It can be helpful to keep track of a log of your progress. Your caregiver can provide you with a simple table to help with this. If you are using the spirometer at home, follow these instructions: SEEK MEDICAL CARE IF:  You are having difficultly using the spirometer. You have trouble using the spirometer as often as instructed. Your pain medication is not giving enough relief while using the spirometer. You develop fever of 100.5 F (38.1 C) or higher. SEEK IMMEDIATE MEDICAL CARE IF:  You cough up bloody sputum that had not been present before. You develop fever of 102 F (38.9 C) or greater. You develop worsening pain at or near the incision site. MAKE SURE YOU:  Understand these instructions. Will watch your condition. Will get help right away if you are not doing well or get worse. Document Released: 03/17/2007 Document Revised: 01/27/2012 Document Reviewed: 05/18/2007 Exit[employer] Patient Information 2014 [employer].   ________________________________________________________________________ WHAT IS A BLOOD TRANSFUSION? Blood Transfusion Information  A transfusion is the replacement of blood or some of its parts. Blood is made up of multiple cells which provide different functions. Red blood cells carry oxygen and are used for blood loss replacement. White blood cells  fight against infection. Platelets control bleeding. Plasma helps clot blood. Other blood products are available for specialized needs, such as hemophilia or other clotting disorders. BEFORE THE TRANSFUSION  Who gives blood for transfusions?  Healthy volunteers who are fully evaluated to make sure their blood is safe. This is blood bank blood. Transfusion therapy is the safest it has ever been in the practice of medicine. Before blood is taken from a donor, a complete history is taken to make sure that person has no history of diseases nor engages in risky social behavior (examples are intravenous drug use or sexual activity with multiple partners). The donor's travel history is screened to minimize risk of transmitting infections, such as malaria. The donated blood is tested for signs of infectious diseases, such as HIV and hepatitis. The blood is then   tested to be sure it is compatible with you in order to minimize the chance of a transfusion reaction. If you or a relative donates blood, this is often done in anticipation of surgery and is not appropriate for emergency situations. It takes many days to process the donated blood. RISKS AND COMPLICATIONS Although transfusion therapy is very safe and saves many lives, the main dangers of transfusion include:  Getting an infectious disease. Developing a transfusion reaction. This is an allergic reaction to something in the blood you were given. Every precaution is taken to prevent this. The decision to have a blood transfusion has been considered carefully by your caregiver before blood is given. Blood is not given unless the benefits outweigh the risks. AFTER THE TRANSFUSION Right after receiving a blood transfusion, you will usually feel much better and more energetic. This is especially true if your red blood cells have gotten low (anemic). The transfusion raises the level of the red blood cells which carry oxygen, and this usually causes an energy  increase. The nurse administering the transfusion will monitor you carefully for complications. HOME CARE INSTRUCTIONS  No special instructions are needed after a transfusion. You may find your energy is better. Speak with your caregiver about any limitations on activity for underlying diseases you may have. SEEK MEDICAL CARE IF:  Your condition is not improving after your transfusion. You develop redness or irritation at the intravenous (IV) site. SEEK IMMEDIATE MEDICAL CARE IF:  Any of the following symptoms occur over the next 12 hours: Shaking chills. You have a temperature by mouth above 102 F (38.9 C), not controlled by medicine. Chest, back, or muscle pain. People around you feel you are not acting correctly or are confused. Shortness of breath or difficulty breathing. Dizziness and fainting. You get a rash or develop hives. You have a decrease in urine output. Your urine turns a dark color or changes to pink, red, or brown. Any of the following symptoms occur over the next 10 days: You have a temperature by mouth above 102 F (38.9 C), not controlled by medicine. Shortness of breath. Weakness after normal activity. The white part of the eye turns yellow (jaundice). You have a decrease in the amount of urine or are urinating less often. Your urine turns a dark color or changes to pink, red, or brown. Document Released: 11/01/2000 Document Revised: 01/27/2012 Document Reviewed: 06/20/2008 [employer] Patient Information 2014 [employer].  _______________________________________________________________________ 

## 2023-05-21 ENCOUNTER — Encounter (HOSPITAL_COMMUNITY)
Admission: RE | Admit: 2023-05-21 | Discharge: 2023-05-21 | Disposition: A | Discharge status: Home / Self Care | Payer: PPO | Source: Ambulatory Visit | Attending: Orthopedic Surgery | Admitting: Orthopedic Surgery

## 2023-05-21 ENCOUNTER — Other Ambulatory Visit: Payer: Self-pay

## 2023-05-21 ENCOUNTER — Encounter (HOSPITAL_COMMUNITY): Payer: Self-pay

## 2023-05-21 VITALS — BP 146/75 | HR 55 | Temp 97.9°F | Resp 18 | Ht 64.0 in | Wt 184.5 lb

## 2023-05-21 DIAGNOSIS — I498 Other specified cardiac arrhythmias: Secondary | ICD-10-CM | POA: Diagnosis not present

## 2023-05-21 DIAGNOSIS — I447 Left bundle-branch block, unspecified: Secondary | ICD-10-CM | POA: Insufficient documentation

## 2023-05-21 DIAGNOSIS — M1612 Unilateral primary osteoarthritis, left hip: Secondary | ICD-10-CM | POA: Insufficient documentation

## 2023-05-21 DIAGNOSIS — Z01818 Encounter for other preprocedural examination: Secondary | ICD-10-CM | POA: Insufficient documentation

## 2023-05-21 DIAGNOSIS — I10 Essential (primary) hypertension: Secondary | ICD-10-CM | POA: Insufficient documentation

## 2023-05-21 LAB — CBC
HCT: 39.8 % (ref 36.0–46.0)
Hemoglobin: 13.4 g/dL (ref 12.0–15.0)
MCH: 33.1 pg (ref 26.0–34.0)
MCHC: 33.7 g/dL (ref 30.0–36.0)
MCV: 98.3 fL (ref 80.0–100.0)
Platelets: 211 10*3/uL (ref 150–400)
RBC: 4.05 MIL/uL (ref 3.87–5.11)
RDW: 13.7 % (ref 11.5–15.5)
WBC: 6.7 10*3/uL (ref 4.0–10.5)
nRBC: 0 % (ref 0.0–0.2)

## 2023-05-21 LAB — TYPE AND SCREEN
ABO/RH(D): O POS
Antibody Screen: NEGATIVE

## 2023-05-21 LAB — BASIC METABOLIC PANEL
Anion gap: 10 (ref 5–15)
BUN: 20 mg/dL (ref 8–23)
CO2: 25 mmol/L (ref 22–32)
Calcium: 9.6 mg/dL (ref 8.9–10.3)
Chloride: 106 mmol/L (ref 98–111)
Creatinine, Ser: 1 mg/dL (ref 0.44–1.00)
GFR, Estimated: 56 mL/min — ABNORMAL LOW (ref >60)
Glucose, Bld: 103 mg/dL — ABNORMAL HIGH (ref 70–99)
Potassium: 3.5 mmol/L (ref 3.5–5.1)
Sodium: 141 mmol/L (ref 135–145)

## 2023-05-21 LAB — SURGICAL PCR SCREEN
MRSA, PCR: NEGATIVE
Staphylococcus aureus: NEGATIVE

## 2023-06-02 NOTE — H&P (Signed)
TOTAL HIP ADMISSION H&P  Patient is admitted for left total hip arthroplasty.  Therapy Plans: HEP Disposition: Home with son (staying at his house) Planned DVT Prophylaxis: aspirin 81mg  BID DME needed: none PCP: Dr. Lewie Chamber, clearance received TXA: IV Allergies: sulfa - hematuria Anesthesia Concerns: none BMI: 31.9 Last HgbA1c: Not diabetic   Other: - Hydrocodone, robaxin, meloxicam - Her son Steele Berg is my neighbor!  Subjective:  Chief Complaint: left hip pain  HPI: Samantha Jenkins, 82 y.o. female, has a history of pain and functional disability in the left hip(s) due to arthritis and patient has failed non-surgical conservative treatments for greater than 12 weeks to include NSAID's and/or analgesics, corticosteriod injections, and activity modification.  Onset of symptoms was gradual starting 2 years ago with gradually worsening course since that time.The patient noted no past surgery on the left hip(s).  Patient currently rates pain in the left hip at 8 out of 10 with activity. Patient has worsening of pain with activity and weight bearing, pain that interfers with activities of daily living, and pain with passive range of motion. Patient has evidence of joint space narrowing by imaging studies. This condition presents safety issues increasing the risk of falls. There is no current active infection.  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   Depression    Dysthymia    FHx: cardiovascular disease    History of blood transfusion as child   History of kidney stones    Hyperlipidemia    Hypertension    LBBB (left bundle branch block) 2014   on ekg   Obesity    Pneumonia    hx of as a child   Vitamin D deficiency     Past Surgical History:  Procedure Laterality Date   ABDOMINAL HYSTERECTOMY  1986   partial   APPENDECTOMY  1986   with hysterectomy   CATARACT EXTRACTION Bilateral 2016   CYSTOSCOPY/URETEROSCOPY/HOLMIUM LASER/STENT PLACEMENT Left 02/09/2021   Procedure: LEFT URETEROSCOPY/ LASER LITHOTRIPSY  AND  STENT PLACEMENT,ENDOPYLOTOMY;  Surgeon: Crist Fat, MD;  Location: [facility];  Service: Urology;  Laterality: Left;   CYSTOSCOPY/URETEROSCOPY/HOLMIUM LASER/STENT PLACEMENT Left 03/15/2021   Procedure: CYSTOSCOPY, LEFT URETEROSCOPY, HOLMIUM LASER ENDOPYELOTOMY, URETERAL STENT PLACEMENT;  Surgeon: Crist Fat, MD;  Location: [facility];  Service: Urology;  Laterality: Left;   EYE SURGERY  2016   bil cataracts   HOLMIUM LASER APPLICATION Left 02/09/2021   Procedure: HOLMIUM LASER APPLICATION;  Surgeon: Crist Fat, MD;  Location:  [facility];  Service: Urology;  Laterality: Left;   KNEE ARTHROSCOPY  1995   left   NEPHROLITHOTOMY Left 03/23/2020   Procedure: LEFT NEPHROLITHOTOMY PERCUTANEOUS WITH SURGEON ACCESS;  Surgeon: Crist Fat, MD;  Location: [facility];  Service: Urology;  Laterality: Left;   TONSILLECTOMY  1945   adenoids removed   TOTAL KNEE ARTHROPLASTY Right 06/06/2020   Procedure: TOTAL KNEE ARTHROPLASTY;  Surgeon:  Durene Romans, MD;  Location: [facility];  Service: Orthopedics;  Laterality: Right;  70 mins   TOTAL KNEE ARTHROPLASTY Left 07/17/2021   Procedure: TOTAL KNEE ARTHROPLASTY;  Surgeon: Durene Romans, MD;  Location: [facility];  Service: Orthopedics;  Laterality: Left;   TUBAL LIGATION  1979    No current facility-administered medications for this encounter.   Current Outpatient Medications  Medication Sig Dispense Refill Last Dose   acetaminophen (TYLENOL) 650 MG CR tablet Take 1,300 mg by mouth every 8 (eight) hours as needed for pain.      albuterol (PROVENTIL HFA;VENTOLIN HFA) 108 (90 BASE) MCG/ACT inhaler Inhale 2 puffs into the lungs every 6 (six) hours as needed for wheezing or shortness of breath. 1 Inhaler 0    bacitracin-polymyxin b (POLYSPORIN) ointment Apply 1 application topically daily as needed (wound care).      bisoprolol-hydrochlorothiazide (ZIAC) 5-6.25 MG tablet TAKE 1 TABLET BY MOUTH DAILY. (Patient taking differently: Take 1 tablet by mouth at bedtime.) 90 tablet 0    diclofenac Sodium (VOLTAREN) 1 % GEL Apply 1 Application topically 2 (two) times daily.      diphenhydramine-acetaminophen (TYLENOL PM) 25-500 MG TABS tablet Take 2 tablets by mouth at bedtime as needed (sleep).      pravastatin (PRAVACHOL) 40 MG tablet TAKE 1 TABLET EVERY DAY (Patient taking differently: Take 40 mg by mouth every evening.) 90 tablet 0    Propylene Glycol, PF, (SYSTANE COMPLETE PF) 0.6 % SOLN Place 1 drop into both eyes daily.      sertraline (ZOLOFT) 100 MG tablet TAKE 1 TABLET EVERY DAY (Patient taking differently: Take 150 mg by mouth at bedtime.) 90 tablet 0    Wheat Dextrin (BENEFIBER) POWD Take 1 Dose by mouth daily. 1 dose = 1 tablespoon      Allergies  Allergen Reactions   Statins     Muscle aches can take pravastatin   Sulfa Antibiotics Other (See Comments)    Blood in urine    Social History   Tobacco Use   Smoking status: Former    Current packs/day: 0.00    Average packs/day: 0.5  packs/day for 10.0 years (5.0 ttl pk-yrs)    Types: Cigarettes    Start date: 07/17/1957    Quit date: 07/18/1967    Years since quitting: 55.9   Smokeless tobacco: Never  Substance Use Topics   Alcohol use: Yes    Alcohol/week: 1.0 standard drink of alcohol    Types: 1 Glasses of wine per week    Comment: 1-2 glasses  weekly    Family History  Problem Relation Age of Onset   Breast cancer Mother    Heart disease Father    Colon cancer Neg Hx    Stomach cancer Neg Hx    Rectal cancer Neg Hx      Review of Systems  Constitutional:  Negative for chills and fever.  Respiratory:  Negative for cough and shortness of breath.   Cardiovascular:  Negative for chest pain.  Gastrointestinal:  Negative for nausea and vomiting.  Musculoskeletal:  Positive for arthralgias.     Objective:  Physical Exam Well nourished and well developed. General: Alert and oriented x3, cooperative and pleasant, no acute distress. Head: normocephalic, atraumatic, neck supple. Eyes: EOMI.  Musculoskeletal: Left hip exam: Painful and limited hip flexion internal and external rotation with pelvic tilting over 5 degrees of internal rotation Active hip flexion with slight external rotation contracture due to pain Neurovascular intact distally  Calves soft and nontender. Motor function intact in LE. Strength 5/5 LE bilaterally. Neuro: Distal pulses 2+. Sensation to light touch intact in LE.  Vital signs in last 24 hours:    Labs:   Estimated body mass index is 31.67 kg/m as calculated from the following:   Height as of 05/21/23: 5\' 4"  (1.626 m).   Weight as of 05/21/23: 83.7 kg.   Imaging Review Plain radiographs demonstrate severe degenerative joint disease of the left hip(s). The bone quality appears to be adequate for age and reported activity level.      Assessment/Plan:  End stage arthritis, left hip(s)  The patient history, physical examination, clinical judgement of the provider and  imaging studies are consistent with end stage degenerative joint disease of the left hip(s) and total hip arthroplasty is deemed medically necessary. The treatment options including medical management, injection therapy, arthroscopy and arthroplasty were discussed at length. The risks and benefits of total hip arthroplasty were presented and reviewed. The risks due to aseptic loosening, infection, stiffness, dislocation/subluxation,  thromboembolic complications and other imponderables were discussed.  The patient acknowledged the explanation, agreed to proceed with the plan and consent was signed. Patient is being admitted for inpatient treatment for surgery, pain control, PT, OT, prophylactic antibiotics, VTE prophylaxis, progressive ambulation and ADL's and discharge planning.The patient is planning to be discharged  home.   Rosalene Billings, PA-C Orthopedic Surgery [facility] [place] [phone]

## 2023-06-03 ENCOUNTER — Observation Stay (HOSPITAL_COMMUNITY)
Admission: RE | Admit: 2023-06-03 | Discharge: 2023-06-04 | Disposition: A | Discharge status: Home / Self Care | Payer: PPO | Source: Ambulatory Visit | Attending: Orthopedic Surgery | Admitting: Orthopedic Surgery

## 2023-06-03 ENCOUNTER — Ambulatory Visit (HOSPITAL_COMMUNITY): Payer: PPO | Admitting: Physician Assistant

## 2023-06-03 ENCOUNTER — Observation Stay (HOSPITAL_COMMUNITY): Payer: PPO

## 2023-06-03 ENCOUNTER — Other Ambulatory Visit: Payer: Self-pay

## 2023-06-03 ENCOUNTER — Encounter (HOSPITAL_COMMUNITY): Payer: Self-pay | Admitting: Orthopedic Surgery

## 2023-06-03 ENCOUNTER — Ambulatory Visit (HOSPITAL_BASED_OUTPATIENT_CLINIC_OR_DEPARTMENT_OTHER): Payer: PPO

## 2023-06-03 ENCOUNTER — Ambulatory Visit (HOSPITAL_COMMUNITY): Payer: PPO

## 2023-06-03 ENCOUNTER — Encounter (HOSPITAL_COMMUNITY)
Admission: RE | Disposition: A | Discharge status: Home / Self Care | Payer: Self-pay | Source: Ambulatory Visit | Attending: Orthopedic Surgery

## 2023-06-03 DIAGNOSIS — Z96653 Presence of artificial knee joint, bilateral: Secondary | ICD-10-CM | POA: Diagnosis not present

## 2023-06-03 DIAGNOSIS — Z79899 Other long term (current) drug therapy: Secondary | ICD-10-CM | POA: Insufficient documentation

## 2023-06-03 DIAGNOSIS — Z87891 Personal history of nicotine dependence: Secondary | ICD-10-CM | POA: Insufficient documentation

## 2023-06-03 DIAGNOSIS — E669 Obesity, unspecified: Secondary | ICD-10-CM | POA: Diagnosis not present

## 2023-06-03 DIAGNOSIS — Z96642 Presence of left artificial hip joint: Secondary | ICD-10-CM

## 2023-06-03 DIAGNOSIS — I10 Essential (primary) hypertension: Secondary | ICD-10-CM

## 2023-06-03 DIAGNOSIS — Z6831 Body mass index (BMI) 31.0-31.9, adult: Secondary | ICD-10-CM

## 2023-06-03 DIAGNOSIS — J45909 Unspecified asthma, uncomplicated: Secondary | ICD-10-CM | POA: Diagnosis not present

## 2023-06-03 DIAGNOSIS — M1612 Unilateral primary osteoarthritis, left hip: Secondary | ICD-10-CM

## 2023-06-03 HISTORY — PX: TOTAL HIP ARTHROPLASTY: SHX124

## 2023-06-03 SURGERY — ARTHROPLASTY, HIP, TOTAL, ANTERIOR APPROACH
Anesthesia: Monitor Anesthesia Care | Site: Hip | Laterality: Left

## 2023-06-03 MED ORDER — MIDAZOLAM HCL 2 MG/2ML IJ SOLN
INTRAMUSCULAR | Status: AC
  Filled 2023-06-03: qty 2

## 2023-06-03 MED ORDER — KETOROLAC TROMETHAMINE 30 MG/ML IJ SOLN
INTRAMUSCULAR | Status: DC | PRN
  Administered 2023-06-03: 30 mg

## 2023-06-03 MED ORDER — ONDANSETRON HCL 4 MG PO TABS: 4.0000 mg | ORAL_TABLET | Freq: Four times a day (QID) | ORAL | Status: DC | PRN

## 2023-06-03 MED ORDER — LACTATED RINGERS IV SOLN: INTRAVENOUS | Status: DC

## 2023-06-03 MED ORDER — PHENOL 1.4 % MT LIQD: 1.0000 | OROMUCOSAL | Status: DC | PRN

## 2023-06-03 MED ORDER — HYDROCODONE-ACETAMINOPHEN 7.5-325 MG PO TABS
1.0000 | ORAL_TABLET | ORAL | Status: DC | PRN
  Administered 2023-06-04 (×3): 2 via ORAL
  Filled 2023-06-03 (×3): qty 2

## 2023-06-03 MED ORDER — OXYCODONE HCL 5 MG PO TABS
ORAL_TABLET | ORAL | Status: AC
  Filled 2023-06-03: qty 1

## 2023-06-03 MED ORDER — METOCLOPRAMIDE HCL 5 MG/ML IJ SOLN: 5.0000 mg | Freq: Three times a day (TID) | INTRAMUSCULAR | Status: DC | PRN

## 2023-06-03 MED ORDER — OXYCODONE HCL 5 MG/5ML PO SOLN: 5.0000 mg | Freq: Once | ORAL | Status: AC | PRN

## 2023-06-03 MED ORDER — HYDROCODONE-ACETAMINOPHEN 5-325 MG PO TABS
1.0000 | ORAL_TABLET | ORAL | Status: DC | PRN
  Administered 2023-06-03: 2 via ORAL
  Filled 2023-06-03: qty 2

## 2023-06-03 MED ORDER — DEXAMETHASONE SODIUM PHOSPHATE 10 MG/ML IJ SOLN
INTRAMUSCULAR | Status: AC
  Filled 2023-06-03: qty 1

## 2023-06-03 MED ORDER — BISACODYL 10 MG RE SUPP: 10.0000 mg | Freq: Every day | RECTAL | Status: DC | PRN

## 2023-06-03 MED ORDER — CHLORHEXIDINE GLUCONATE 0.12 % MT SOLN
15.0000 mL | Freq: Once | OROMUCOSAL | Status: AC
  Administered 2023-06-03: 15 mL via OROMUCOSAL

## 2023-06-03 MED ORDER — DEXAMETHASONE SODIUM PHOSPHATE 10 MG/ML IJ SOLN
8.0000 mg | Freq: Once | INTRAMUSCULAR | Status: AC
  Administered 2023-06-03: 8 mg via INTRAVENOUS

## 2023-06-03 MED ORDER — OXYCODONE HCL 5 MG PO TABS
5.0000 mg | ORAL_TABLET | Freq: Once | ORAL | Status: AC | PRN
  Administered 2023-06-03: 5 mg via ORAL

## 2023-06-03 MED ORDER — METOCLOPRAMIDE HCL 5 MG PO TABS: 5.0000 mg | ORAL_TABLET | Freq: Three times a day (TID) | ORAL | Status: DC | PRN

## 2023-06-03 MED ORDER — MENTHOL 3 MG MT LOZG: 1.0000 | LOZENGE | OROMUCOSAL | Status: DC | PRN

## 2023-06-03 MED ORDER — ACETAMINOPHEN 10 MG/ML IV SOLN: 1000.0000 mg | Freq: Once | INTRAVENOUS | Status: DC | PRN

## 2023-06-03 MED ORDER — ACETAMINOPHEN 500 MG PO TABS
1000.0000 mg | ORAL_TABLET | Freq: Once | ORAL | Status: AC
  Administered 2023-06-03: 1000 mg via ORAL
  Filled 2023-06-03: qty 2

## 2023-06-03 MED ORDER — ONDANSETRON HCL 4 MG/2ML IJ SOLN
INTRAMUSCULAR | Status: DC | PRN
  Administered 2023-06-03: 4 mg via INTRAVENOUS

## 2023-06-03 MED ORDER — POVIDONE-IODINE 10 % EX SWAB: 2.0000 | Freq: Once | CUTANEOUS | Status: DC

## 2023-06-03 MED ORDER — METHOCARBAMOL 500 MG PO TABS
500.0000 mg | ORAL_TABLET | Freq: Four times a day (QID) | ORAL | Status: DC | PRN
  Administered 2023-06-03 – 2023-06-04 (×2): 500 mg via ORAL
  Filled 2023-06-03 (×3): qty 1

## 2023-06-03 MED ORDER — MORPHINE SULFATE (PF) 2 MG/ML IV SOLN
0.5000 mg | INTRAVENOUS | Status: DC | PRN
  Administered 2023-06-03: 1 mg via INTRAVENOUS
  Filled 2023-06-03: qty 1

## 2023-06-03 MED ORDER — PROPOFOL 500 MG/50ML IV EMUL
INTRAVENOUS | Status: DC | PRN
  Administered 2023-06-03: 72 ug/kg/min via INTRAVENOUS

## 2023-06-03 MED ORDER — AMISULPRIDE (ANTIEMETIC) 5 MG/2ML IV SOLN: 10.0000 mg | Freq: Once | INTRAVENOUS | Status: DC | PRN

## 2023-06-03 MED ORDER — PROPOFOL 1000 MG/100ML IV EMUL
INTRAVENOUS | Status: AC
  Filled 2023-06-03: qty 100

## 2023-06-03 MED ORDER — BISOPROLOL FUMARATE 5 MG PO TABS
5.0000 mg | ORAL_TABLET | Freq: Every day | ORAL | Status: DC
  Administered 2023-06-03: 5 mg via ORAL
  Filled 2023-06-03: qty 1

## 2023-06-03 MED ORDER — SODIUM CHLORIDE (PF) 0.9 % IJ SOLN
INTRAMUSCULAR | Status: DC | PRN
  Administered 2023-06-03: 30 mL

## 2023-06-03 MED ORDER — SODIUM CHLORIDE (PF) 0.9 % IJ SOLN
INTRAMUSCULAR | Status: AC
  Filled 2023-06-03: qty 50

## 2023-06-03 MED ORDER — METHOCARBAMOL 1000 MG/10ML IJ SOLN: 500.0000 mg | Freq: Four times a day (QID) | INTRAVENOUS | Status: DC | PRN

## 2023-06-03 MED ORDER — TRANEXAMIC ACID-NACL 1000-0.7 MG/100ML-% IV SOLN
1000.0000 mg | Freq: Once | INTRAVENOUS | Status: AC
  Administered 2023-06-03: 1000 mg via INTRAVENOUS
  Filled 2023-06-03: qty 100

## 2023-06-03 MED ORDER — ORAL CARE MOUTH RINSE: 15.0000 mL | Freq: Once | OROMUCOSAL | Status: AC

## 2023-06-03 MED ORDER — HYDROCHLOROTHIAZIDE 12.5 MG PO TABS
6.2500 mg | ORAL_TABLET | Freq: Every day | ORAL | Status: DC
  Administered 2023-06-03: 6.25 mg via ORAL
  Filled 2023-06-03: qty 1

## 2023-06-03 MED ORDER — BUPIVACAINE IN DEXTROSE 0.75-8.25 % IT SOLN
INTRATHECAL | Status: DC | PRN
  Administered 2023-06-03: 1.8 mL via INTRATHECAL

## 2023-06-03 MED ORDER — PHENYLEPHRINE HCL-NACL 20-0.9 MG/250ML-% IV SOLN
INTRAVENOUS | Status: DC | PRN
  Administered 2023-06-03: 15 ug/min via INTRAVENOUS

## 2023-06-03 MED ORDER — ONDANSETRON HCL 4 MG/2ML IJ SOLN
INTRAMUSCULAR | Status: AC
  Filled 2023-06-03: qty 2

## 2023-06-03 MED ORDER — MELOXICAM 15 MG PO TABS
15.0000 mg | ORAL_TABLET | Freq: Every day | ORAL | Status: DC
  Administered 2023-06-04: 15 mg via ORAL
  Filled 2023-06-03: qty 1

## 2023-06-03 MED ORDER — ACETAMINOPHEN 325 MG PO TABS: 325.0000 mg | ORAL_TABLET | Freq: Four times a day (QID) | ORAL | Status: DC | PRN

## 2023-06-03 MED ORDER — KETOROLAC TROMETHAMINE 30 MG/ML IJ SOLN
INTRAMUSCULAR | Status: AC
  Filled 2023-06-03: qty 1

## 2023-06-03 MED ORDER — BUPIVACAINE-EPINEPHRINE 0.5% -1:200000 IJ SOLN
INTRAMUSCULAR | Status: DC | PRN
  Administered 2023-06-03: 30 mL

## 2023-06-03 MED ORDER — SERTRALINE HCL 50 MG PO TABS
150.0000 mg | ORAL_TABLET | Freq: Every day | ORAL | Status: DC
  Administered 2023-06-03: 150 mg via ORAL
  Filled 2023-06-03: qty 1

## 2023-06-03 MED ORDER — MIDAZOLAM HCL 5 MG/5ML IJ SOLN
INTRAMUSCULAR | Status: DC | PRN
  Administered 2023-06-03 (×2): 1 mg via INTRAVENOUS

## 2023-06-03 MED ORDER — CEFAZOLIN SODIUM-DEXTROSE 2-4 GM/100ML-% IV SOLN
2.0000 g | INTRAVENOUS | Status: AC
  Administered 2023-06-03: 2 g via INTRAVENOUS
  Filled 2023-06-03: qty 100

## 2023-06-03 MED ORDER — TRANEXAMIC ACID-NACL 1000-0.7 MG/100ML-% IV SOLN
1000.0000 mg | INTRAVENOUS | Status: AC
  Administered 2023-06-03: 1000 mg via INTRAVENOUS
  Filled 2023-06-03: qty 100

## 2023-06-03 MED ORDER — ONDANSETRON HCL 4 MG/2ML IJ SOLN: 4.0000 mg | Freq: Four times a day (QID) | INTRAMUSCULAR | Status: DC | PRN

## 2023-06-03 MED ORDER — 0.9 % SODIUM CHLORIDE (POUR BTL) OPTIME
TOPICAL | Status: DC | PRN
  Administered 2023-06-03: 1000 mL

## 2023-06-03 MED ORDER — FENTANYL CITRATE (PF) 100 MCG/2ML IJ SOLN
INTRAMUSCULAR | Status: DC | PRN
  Administered 2023-06-03: 100 ug via INTRAVENOUS

## 2023-06-03 MED ORDER — BISOPROLOL-HYDROCHLOROTHIAZIDE 5-6.25 MG PO TABS: 1.0000 | ORAL_TABLET | Freq: Every day | ORAL | Status: DC

## 2023-06-03 MED ORDER — FENTANYL CITRATE (PF) 100 MCG/2ML IJ SOLN
INTRAMUSCULAR | Status: AC
  Filled 2023-06-03: qty 2

## 2023-06-03 MED ORDER — PRAVASTATIN SODIUM 20 MG PO TABS
40.0000 mg | ORAL_TABLET | Freq: Every evening | ORAL | Status: DC
  Administered 2023-06-03: 40 mg via ORAL
  Filled 2023-06-03: qty 2

## 2023-06-03 MED ORDER — PROPOFOL 500 MG/50ML IV EMUL: INTRAVENOUS | Status: DC | PRN

## 2023-06-03 MED ORDER — ASPIRIN 81 MG PO CHEW
81.0000 mg | CHEWABLE_TABLET | Freq: Two times a day (BID) | ORAL | Status: DC
  Administered 2023-06-03 – 2023-06-04 (×2): 81 mg via ORAL
  Filled 2023-06-03 (×2): qty 1

## 2023-06-03 MED ORDER — FENTANYL CITRATE PF 50 MCG/ML IJ SOSY: 25.0000 ug | PREFILLED_SYRINGE | INTRAMUSCULAR | Status: DC | PRN

## 2023-06-03 MED ORDER — EPHEDRINE SULFATE-NACL 50-0.9 MG/10ML-% IV SOSY
PREFILLED_SYRINGE | INTRAVENOUS | Status: DC | PRN
  Administered 2023-06-03 (×2): 5 mg via INTRAVENOUS

## 2023-06-03 MED ORDER — NUTRISOURCE FIBER PO PACK
1.0000 | PACK | Freq: Every day | ORAL | Status: DC
  Filled 2023-06-03: qty 1

## 2023-06-03 MED ORDER — POLYETHYLENE GLYCOL 3350 17 G PO PACK
17.0000 g | PACK | Freq: Two times a day (BID) | ORAL | Status: DC
  Administered 2023-06-03 – 2023-06-04 (×2): 17 g via ORAL
  Filled 2023-06-03 (×2): qty 1

## 2023-06-03 MED ORDER — SODIUM CHLORIDE 0.9 % IV SOLN: INTRAVENOUS | Status: DC

## 2023-06-03 MED ORDER — DEXAMETHASONE SODIUM PHOSPHATE 10 MG/ML IJ SOLN
10.0000 mg | Freq: Once | INTRAMUSCULAR | Status: AC
  Administered 2023-06-04: 10 mg via INTRAVENOUS
  Filled 2023-06-03: qty 1

## 2023-06-03 MED ORDER — CEFAZOLIN SODIUM-DEXTROSE 2-4 GM/100ML-% IV SOLN
2.0000 g | Freq: Four times a day (QID) | INTRAVENOUS | Status: AC
  Administered 2023-06-03 – 2023-06-04 (×2): 2 g via INTRAVENOUS
  Filled 2023-06-03 (×2): qty 100

## 2023-06-03 MED ORDER — DIPHENHYDRAMINE HCL 12.5 MG/5ML PO ELIX: 12.5000 mg | ORAL_SOLUTION | ORAL | Status: DC | PRN

## 2023-06-03 MED ORDER — ALBUTEROL SULFATE (2.5 MG/3ML) 0.083% IN NEBU: 2.5000 mg | INHALATION_SOLUTION | Freq: Four times a day (QID) | RESPIRATORY_TRACT | Status: DC | PRN

## 2023-06-03 MED ORDER — EPHEDRINE 5 MG/ML INJ
INTRAVENOUS | Status: AC
  Filled 2023-06-03: qty 5

## 2023-06-03 MED ORDER — PROPOFOL 10 MG/ML IV BOLUS
INTRAVENOUS | Status: DC | PRN
  Administered 2023-06-03: 30 mg via INTRAVENOUS
  Administered 2023-06-03: 20 mg via INTRAVENOUS
  Administered 2023-06-03: 40 mg via INTRAVENOUS

## 2023-06-03 MED ORDER — DOCUSATE SODIUM 100 MG PO CAPS
100.0000 mg | ORAL_CAPSULE | Freq: Two times a day (BID) | ORAL | Status: DC
  Administered 2023-06-03 – 2023-06-04 (×2): 100 mg via ORAL
  Filled 2023-06-03 (×2): qty 1

## 2023-06-03 SURGICAL SUPPLY — 44 items
ADH SKN CLS APL DERMABOND .7 (GAUZE/BANDAGES/DRESSINGS) ×1
ARTICULEZE HEAD (Hips) ×1 IMPLANT
BAG COUNTER SPONGE SURGICOUNT (BAG) IMPLANT
BAG SPEC THK2 15X12 ZIP CLS (MISCELLANEOUS)
BAG SPNG CNTER NS LX DISP (BAG)
BAG ZIPLOCK 12X15 (MISCELLANEOUS) IMPLANT
BLADE SAG 18X100X1.27 (BLADE) ×1 IMPLANT
CABLE CERLAGE W/CRIMP 1.8 (Cable) IMPLANT
COVER PERINEAL POST (MISCELLANEOUS) ×1 IMPLANT
COVER SURGICAL LIGHT HANDLE (MISCELLANEOUS) ×1 IMPLANT
CUP ACETBLR 52 OD PINNACLE (Hips) IMPLANT
DERMABOND ADVANCED .7 DNX12 (GAUZE/BANDAGES/DRESSINGS) ×1 IMPLANT
DRAPE FOOT SWITCH (DRAPES) ×1 IMPLANT
DRAPE STERI IOBAN 125X83 (DRAPES) ×1 IMPLANT
DRAPE U-SHAPE 47X51 STRL (DRAPES) ×2 IMPLANT
DRESSING AQUACEL AG SP 3.5X10 (GAUZE/BANDAGES/DRESSINGS) ×1 IMPLANT
DRSG AQUACEL AG ADV 3.5X10 (GAUZE/BANDAGES/DRESSINGS) IMPLANT
DRSG AQUACEL AG SP 3.5X10 (GAUZE/BANDAGES/DRESSINGS) ×1
DURAPREP 26ML APPLICATOR (WOUND CARE) ×1 IMPLANT
ELECT REM PT RETURN 15FT ADLT (MISCELLANEOUS) ×1 IMPLANT
GLOVE BIO SURGEON STRL SZ 6 (GLOVE) ×1 IMPLANT
GLOVE BIOGEL PI IND STRL 6.5 (GLOVE) ×1 IMPLANT
GLOVE BIOGEL PI IND STRL 7.5 (GLOVE) ×1 IMPLANT
GLOVE ORTHO TXT STRL SZ7.5 (GLOVE) ×2 IMPLANT
GOWN STRL REUS W/ TWL LRG LVL3 (GOWN DISPOSABLE) ×2 IMPLANT
GOWN STRL REUS W/TWL LRG LVL3 (GOWN DISPOSABLE) ×2
HEAD ARTICULEZE (Hips) IMPLANT
HOLDER FOLEY CATH W/STRAP (MISCELLANEOUS) ×1 IMPLANT
KIT TURNOVER KIT A (KITS) IMPLANT
LINER NEUTRAL 52X36MM PLUS 4 (Liner) IMPLANT
NDL SAFETY ECLIP 18X1.5 (MISCELLANEOUS) IMPLANT
PACK ANTERIOR HIP CUSTOM (KITS) ×1 IMPLANT
SCREW 6.5MMX25MM (Screw) IMPLANT
STEM FEM ACTIS HIGH SZ3 (Stem) IMPLANT
SUT MNCRL AB 4-0 PS2 18 (SUTURE) ×1 IMPLANT
SUT STRATAFIX 0 PDS 27 VIOLET (SUTURE) ×1
SUT VIC AB 1 CT1 36 (SUTURE) ×3 IMPLANT
SUT VIC AB 2-0 CT1 27 (SUTURE) ×2
SUT VIC AB 2-0 CT1 TAPERPNT 27 (SUTURE) ×2 IMPLANT
SUTURE STRATFX 0 PDS 27 VIOLET (SUTURE) ×1 IMPLANT
SYR 3ML LL SCALE MARK (SYRINGE) IMPLANT
TRAY FOLEY MTR SLVR 16FR STAT (SET/KITS/TRAYS/PACK) IMPLANT
TUBE SUCTION HIGH CAP CLEAR NV (SUCTIONS) ×1 IMPLANT
WATER STERILE IRR 1000ML POUR (IV SOLUTION) ×1 IMPLANT

## 2023-06-03 NOTE — Anesthesia Procedure Notes (Signed)
Procedure Name: MAC Date/Time: 06/03/2023 3:32 PM  Performed by: Maurene Capes, CRNAPre-anesthesia Checklist: Patient identified, Emergency Drugs available, Suction available and Patient being monitored Patient Re-evaluated:Patient Re-evaluated prior to induction Oxygen Delivery Method: Simple face mask Induction Type: IV induction Placement Confirmation: positive ETCO2 Dental Injury: Teeth and Oropharynx as per pre-operative assessment

## 2023-06-03 NOTE — Anesthesia Preprocedure Evaluation (Addendum)
Anesthesia Evaluation  Patient identified by MRN, date of birth, ID band Patient awake    Reviewed: Allergy & Precautions, NPO status , Patient's Chart, lab work & pertinent test results, reviewed documented beta blocker date and time   History of Anesthesia Complications Negative for: history of anesthetic complications  Airway Mallampati: III  TM Distance: >3 FB Neck ROM: Full    Dental no notable dental hx. (+) Dental Advisory Given   Pulmonary asthma , former smoker   Pulmonary exam normal        Cardiovascular hypertension, Pt. on home beta blockers and Pt. on medications Normal cardiovascular exam+ dysrhythmias (LBBB)      Neuro/Psych  PSYCHIATRIC DISORDERS Anxiety Depression    negative neurological ROS     GI/Hepatic negative GI ROS, Neg liver ROS,,,  Endo/Other  Obesity   Renal/GU negative Renal ROS     Musculoskeletal  (+) Arthritis ,    Abdominal   Peds  Hematology negative hematology ROS (+)   Anesthesia Other Findings Day of surgery medications reviewed with the patient.  Reproductive/Obstetrics                             Anesthesia Physical Anesthesia Plan  ASA: 3  Anesthesia Plan: Spinal and MAC   Post-op Pain Management:  Regional for Post-op pain and Toradol IV (intra-op)* and Tylenol PO (pre-op)*   Induction: Intravenous  PONV Risk Score and Plan: 3 and Dexamethasone, Ondansetron and Propofol infusion  Airway Management Planned: Natural Airway  Additional Equipment:   Intra-op Plan:   Post-operative Plan:   Informed Consent: I have reviewed the patients History and Physical, chart, labs and discussed the procedure including the risks, benefits and alternatives for the proposed anesthesia with the patient or authorized representative who has indicated his/her understanding and acceptance.     Dental advisory given  Plan Discussed with: Anesthesiologist  and CRNA  Anesthesia Plan Comments:         Anesthesia Quick Evaluation

## 2023-06-03 NOTE — Anesthesia Postprocedure Evaluation (Signed)
Anesthesia Post Note  Patient: Samantha Jenkins  Procedure(s) Performed: TOTAL HIP ARTHROPLASTY ANTERIOR APPROACH (Left: Hip)     Patient location during evaluation: PACU Anesthesia Type: MAC and Spinal Level of consciousness: awake and alert Pain management: pain level controlled Vital Signs Assessment: post-procedure vital signs reviewed and stable Respiratory status: spontaneous breathing and respiratory function stable Cardiovascular status: blood pressure returned to baseline and stable Postop Assessment: spinal receding Anesthetic complications: no   No notable events documented.  Last Vitals:  Vitals:   06/03/23 1800 06/03/23 1815  BP: (!) 141/55 (!) 153/62  Pulse: (!) 57 61  Resp: 15 15  Temp:  36.6 C  SpO2: 96% 96%    Last Pain:  Vitals:   06/03/23 1810  TempSrc:   PainSc: 2                  [name]

## 2023-06-03 NOTE — Anesthesia Procedure Notes (Signed)
Spinal  Patient location during procedure: OR Start time: 06/03/2023 1:44 PM End time: 06/03/2023 3:40 PM Reason for block: surgical anesthesia Staffing Performed: anesthesiologist  Anesthesiologist: Heather Roberts, MD Performed by: Heather Roberts, MD Authorized by: Heather Roberts, MD   Preanesthetic Checklist Completed: patient identified, IV checked, risks and benefits discussed, surgical consent, monitors and equipment checked, pre-op evaluation and timeout performed Spinal Block Patient position: sitting Prep: DuraPrep Patient monitoring: cardiac monitor, continuous pulse ox and blood pressure Approach: midline Location: L2-3 Injection technique: single-shot Needle Needle type: Pencan  Needle gauge: 24 G Needle length: 9 cm Assessment Events: CSF return Additional Notes Functioning IV was confirmed and monitors were applied. Sterile prep and drape, including hand hygiene and sterile gloves were used. The patient was positioned and the spine was prepped. The skin was anesthetized with lidocaine.  Free flow of clear CSF was obtained prior to injecting local anesthetic into the CSF.  The spinal needle aspirated freely following injection.  The needle was carefully withdrawn.  The patient tolerated the procedure well.

## 2023-06-03 NOTE — Discharge Instructions (Signed)

## 2023-06-03 NOTE — Transfer of Care (Signed)
Immediate Anesthesia Transfer of Care Note  Patient: Samantha Jenkins  Procedure(s) Performed: TOTAL HIP ARTHROPLASTY ANTERIOR APPROACH (Left: Hip)  Patient Location: PACU  Anesthesia Type:Spinal  Level of Consciousness: awake, alert , oriented, and patient cooperative  Airway & Oxygen Therapy: Patient Spontanous Breathing and Patient connected to face mask oxygen  Post-op Assessment: Report given to RN and Post -op Vital signs reviewed and stable  Post vital signs: Reviewed and stable  Last Vitals:  Vitals Value Taken Time  BP 123/53 06/03/23 1733  Temp 37.3 C 06/03/23 1733  Pulse 63 06/03/23 1736  Resp 17 06/03/23 1736  SpO2 100 % 06/03/23 1736  Vitals shown include unfiled device data.  Last Pain:  Vitals:   06/03/23 1733  TempSrc:   PainSc: 0-No pain         Complications: No notable events documented.

## 2023-06-03 NOTE — Op Note (Signed)
NAME:  Samantha Jenkins                ACCOUNT NO.: [id number]      MEDICAL RECORD NO.: [id number]      FACILITY:  [facility]      PHYSICIAN:  Shelda Pal  DATE OF BIRTH:  [birth year]     DATE OF PROCEDURE:  06/03/2023                                 OPERATIVE REPORT         PREOPERATIVE DIAGNOSIS: Left  hip osteoarthritis.      POSTOPERATIVE DIAGNOSIS:  Left hip osteoarthritis.      PROCEDURE:  Left total hip replacement through an anterior approach   utilizing DePuy THR system, component size 52 mm pinnacle cup, a size 36+4 neutral   Altrex liner, a size 3 Hi Actis stem with a 36+5 Articuleze metal head ball.   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 Charlann Boxer, M.D.      ASSISTANT:  Rosalene Billings, PA-C     ANESTHESIA:  Spinal.      SPECIMENS:  None.      COMPLICATIONS:  None.      BLOOD LOSS:  500 cc     DRAINS:  None.      INDICATION OF THE PROCEDURE:  Samantha Jenkins is a 82 y.o. female who had   presented to office for evaluation of left hip pain.  Radiographs revealed   progressive degenerative changes with bone-on-bone   articulation of the  hip joint, including subchondral cystic changes and osteophytes.  The patient had painful limited range of   motion significantly affecting their overall quality of life and function.  The patient was failing to    respond to conservative measures including medications and/or injections and activity modification and at this point was ready   to proceed with more definitive measures.  Consent was obtained for   benefit of pain relief.  Specific risks of infection, DVT, component   failure, dislocation, neurovascular injury, and need for revision surgery were reviewed in the office.     PROCEDURE IN DETAIL:  The patient was brought to operative theater.   Once adequate anesthesia, preoperative antibiotics, 2 gm of Ancef, 1 gm of Tranexamic Acid, and 10 mg  of Decadron were administered, the patient was positioned supine on the [employer] table.  Once the patient was safely positioned with adequate padding of boney prominences we predraped out the hip, and used fluoroscopy to confirm orientation of the pelvis.      The left hip was then prepped and draped from proximal iliac crest to   mid thigh with a shower curtain technique.      Time-out was performed identifying the patient, planned procedure, and the appropriate extremity.     An incision was then made 2 cm lateral to the   anterior superior iliac spine extending over the orientation of the   tensor fascia lata muscle and sharp dissection was carried down to the   fascia of the muscle.      The fascia was then incised.  The muscle belly was identified and swept   laterally and retractor placed along the superior neck.  Following   cauterization of the circumflex vessels and removing some pericapsular  fat, a second cobra retractor was placed on the inferior neck.  A T-capsulotomy was made along the line of the   superior neck to the trochanteric fossa, then extended proximally and   distally.  Tag sutures were placed and the retractors were then placed   intracapsular.  We then identified the trochanteric fossa and   orientation of my neck cut and then made a neck osteotomy with the femur on traction.  The femoral   head was removed without difficulty or complication.  Traction was let   off and retractors were placed posterior and anterior around the   acetabulum.      The labrum and foveal tissue were debrided.  I began reaming with a 45 mm   reamer and reamed up to 51 mm reamer with good bony bed preparation and a 52 mm  cup was chosen.  The final 52 mm Pinnacle cup was then impacted under fluoroscopy to confirm the depth of penetration and orientation with respect to   Abduction and forward flexion.  A screw was placed into the ilium followed by the hole eliminator.  The final    36+4 neutral Altrex liner was impacted with good visualized rim fit.  The cup was positioned anatomically within the acetabular portion of the pelvis.      At this point, the femur was rolled to 100 degrees.  Further capsule was   released off the inferior aspect of the femoral neck.  I then   released the superior capsule proximally.  With the leg in a neutral position the hook was placed laterally   along the femur under the vastus lateralis origin and elevated manually and then held in position using the hook attachment on the bed.  The leg was then extended and adducted with the leg rolled to 100   degrees of external rotation.  Retractors were placed along the medial calcar and posteriorly over the greater trochanter.  Once the proximal femur was fully   exposed, I used a box osteotome to set orientation.  I then began   broaching with the starting chili pepper broach and passed this by hand and then broached up to 3.  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  I went ahead and performed trial reductions.  During the trial reductions with a size 3 broach in place with a high offset neck I found a size +5 head ball seem to match her leg lengths and offset the best.    Prior to placement of the final stem I decided to place a prophylactic cable in the subtrochanteric region.  With the trial broach is removed and the left lower extremity brought into the neutral position with the leg up I passed a single cable in the subtrochanteric region.  I confirmed the position radiographically.  We then provisionally tensioned this.  We then reposition the hip.  The final 3 Hi Actis stem was chosen and it was impacted down to the level of neck cut.  Based on this   and the trial reductions, a final 36+5 Articuleze metal head ball was chosen and   impacted onto a clean and dry trunnion, and the hip was reduced.  Once the final components were in position we  retensioned the subtrochanteric cable.  We crimped it with the screwdriver then cut the cable.  Final fluoroscopic radiographs did not indicate any extended complication or concern based on the intraoperative identification  of the calcar crack.  The hip had been irrigated throughout the case again at this point.  I did   reapproximate the superior capsular leaflet to the anterior leaflet   using #1 Vicryl.  The fascia of the   tensor fascia lata muscle was then reapproximated using #1 Vicryl and #0 Stratafix sutures.  The   remaining wound was closed with 2-0 Vicryl and running 4-0 Monocryl.   The hip was cleaned, dried, and dressed sterilely using Dermabond and   Aquacel dressing.  The patient was then brought   to recovery room in stable condition tolerating the procedure well.    Rosalene Billings, PA-C was present for the entirety of the case involved from   preoperative positioning, perioperative retractor management, general   facilitation of the case, as well as primary wound closure as assistant.            Madlyn Frankel Charlann Boxer, M.D.        06/03/2023 3:32 PM

## 2023-06-04 ENCOUNTER — Encounter (HOSPITAL_COMMUNITY): Payer: Self-pay | Admitting: Orthopedic Surgery

## 2023-06-04 DIAGNOSIS — M1612 Unilateral primary osteoarthritis, left hip: Secondary | ICD-10-CM | POA: Diagnosis not present

## 2023-06-04 LAB — CBC
HCT: 33.5 % — ABNORMAL LOW (ref 36.0–46.0)
Hemoglobin: 11.1 g/dL — ABNORMAL LOW (ref 12.0–15.0)
MCH: 33.1 pg (ref 26.0–34.0)
MCHC: 33.1 g/dL (ref 30.0–36.0)
MCV: 100 fL (ref 80.0–100.0)
Platelets: 167 10*3/uL (ref 150–400)
RBC: 3.35 MIL/uL — ABNORMAL LOW (ref 3.87–5.11)
RDW: 13.6 % (ref 11.5–15.5)
WBC: 10.6 10*3/uL — ABNORMAL HIGH (ref 4.0–10.5)
nRBC: 0 % (ref 0.0–0.2)

## 2023-06-04 LAB — BASIC METABOLIC PANEL
Anion gap: 8 (ref 5–15)
BUN: 15 mg/dL (ref 8–23)
CO2: 24 mmol/L (ref 22–32)
Calcium: 8.4 mg/dL — ABNORMAL LOW (ref 8.9–10.3)
Chloride: 105 mmol/L (ref 98–111)
Creatinine, Ser: 1.14 mg/dL — ABNORMAL HIGH (ref 0.44–1.00)
GFR, Estimated: 48 mL/min — ABNORMAL LOW (ref >60)
Glucose, Bld: 170 mg/dL — ABNORMAL HIGH (ref 70–99)
Potassium: 3.8 mmol/L (ref 3.5–5.1)
Sodium: 137 mmol/L (ref 135–145)

## 2023-06-04 MED ORDER — MELOXICAM 15 MG PO TABS: 15.0000 mg | ORAL_TABLET | Freq: Every day | ORAL | 0 refills | Status: DC

## 2023-06-04 MED ORDER — ASPIRIN 81 MG PO CHEW: 81.0000 mg | CHEWABLE_TABLET | Freq: Two times a day (BID) | ORAL | 0 refills | Status: AC

## 2023-06-04 MED ORDER — METHOCARBAMOL 500 MG PO TABS: 500.0000 mg | ORAL_TABLET | Freq: Four times a day (QID) | ORAL | 2 refills | Status: DC | PRN

## 2023-06-04 MED ORDER — SENNA 8.6 MG PO TABS: 2.0000 | ORAL_TABLET | Freq: Every day | ORAL | 0 refills | Status: AC

## 2023-06-04 MED ORDER — HYDROCODONE-ACETAMINOPHEN 7.5-325 MG PO TABS: 1.0000 | ORAL_TABLET | ORAL | 0 refills | Status: DC | PRN

## 2023-06-04 MED ORDER — POLYETHYLENE GLYCOL 3350 17 G PO PACK: 17.0000 g | PACK | Freq: Two times a day (BID) | ORAL | 0 refills | Status: DC

## 2023-06-04 NOTE — Progress Notes (Signed)
Discharge package printed and instructions given to pt. Pt Verbalizes understanding.

## 2023-06-04 NOTE — Plan of Care (Signed)
  Problem: Activity: Goal: Risk for activity intolerance will decrease Outcome: Progressing   Problem: Pain Managment: Goal: General experience of comfort will improve Outcome: Progressing   Problem: Safety: Goal: Ability to remain free from injury will improve Outcome: Progressing   

## 2023-06-04 NOTE — Evaluation (Addendum)
Physical Therapy Evaluation Patient Details Name: Samantha Jenkins MRN: [id number] DOB: October 19, 1941 Today's Date: 06/04/2023  History of Present Illness  82 yo female s/p L THA, AA on 06/03/23. PMH: bil TKA, LBBB, obesity, HTN  Clinical Impression  Pt is s/p THA resulting in the deficits listed below (see PT Problem List).  Pt having some issues with pain, able to amb ~ 32' with min/guard, will see again later today, pt is hopeful for d/c today.  Pt will benefit from acute skilled PT to increase their independence and safety with mobility to facilitate discharge.          Assistance Recommended at Discharge    If plan is discharge home, recommend the following:  Can travel by private vehicle  Help with stairs or ramp for entrance;Assistance with cooking/housework;Assist for transportation        Equipment Recommendations None recommended by PT  Recommendations for Other Services       Functional Status Assessment Patient has had a recent decline in their functional status and demonstrates the ability to make significant improvements in function in a reasonable and predictable amount of time.     Precautions / Restrictions Precautions Precautions: Fall Restrictions Weight Bearing Restrictions: No Other Position/Activity Restrictions: WBAT      Mobility  Bed Mobility Overal bed mobility: Needs Assistance Bed Mobility: Supine to Sit     Supine to sit: Min guard     General bed mobility comments: incr time    Transfers Overall transfer level: Needs assistance Equipment used: Rolling walker (2 wheels) Transfers: Sit to/from Stand Sit to Stand: Min guard           General transfer comment: cues for hand placement and LLE position    Ambulation/Gait Ambulation/Gait assistance: Min guard Gait Distance (Feet): 70 Feet Assistive device: Rolling walker (2 wheels) Gait Pattern/deviations: Step-to pattern       General Gait Details: cues for sequence and RW  position from self  Stairs            Wheelchair Mobility     Tilt Bed    Modified Rankin (Stroke Patients Only)       Balance                                             Pertinent Vitals/Pain Pain Assessment Pain Assessment: 0-10 Pain Score: 4  Pain Location: left knee Pain Descriptors / Indicators: Aching, Sore Pain Intervention(s): Limited activity within patient's tolerance, Monitored during session, Premedicated before session, Repositioned    Home Living Family/patient expects to be discharged to:: Private residence Living Arrangements: Alone Available Help at Discharge: Family;Available 24 hours/day Type of Home: House Home Access: Stairs to enter   Entrance Stairs-Number of Steps: 1 Alternate Level Stairs-Number of Steps: 4 Home Layout: Multi-level Home Equipment: Agricultural consultant (2 wheels);Toilet riser      Prior Function Prior Level of Function : Independent/Modified Independent             Mobility Comments: amb  with RW       Hand Dominance        Extremity/Trunk Assessment   Upper Extremity Assessment Upper Extremity Assessment: Defer to OT evaluation    Lower Extremity Assessment Lower Extremity Assessment: LLE deficits/detail LLE Deficits / Details: ankle WFL, knee and hip grossly 2+/5, limtied by anticipated post op pain and weakness  Communication      Cognition Arousal/Alertness: Awake/alert Behavior During Therapy: WFL for tasks assessed/performed Overall Cognitive Status: Within Functional Limits for tasks assessed                                          General Comments      Exercises Total Joint Exercises Ankle Circles/Pumps: AROM, Both, 10 reps Quad Sets: 5 reps, Both, AROM   Assessment/Plan    PT Assessment Patient needs continued PT services  PT Problem List Decreased strength;Decreased range of motion;Decreased activity tolerance;Decreased balance;Decreased  mobility;Decreased knowledge of precautions;Decreased knowledge of use of DME;Pain       PT Treatment Interventions DME instruction;Therapeutic exercise;Gait training;Functional mobility training;Therapeutic activities;Patient/family education;Stair training    PT Goals (Current goals can be found in the Care Plan section)  Acute Rehab PT Goals PT Goal Formulation: With patient Time For Goal Achievement: 06/11/23 Potential to Achieve Goals: Good    Frequency 7X/week     Co-evaluation               AM-PAC PT "6 Clicks" Mobility  Outcome Measure Help needed turning from your back to your side while in a flat bed without using bedrails?: A Little Help needed moving from lying on your back to sitting on the side of a flat bed without using bedrails?: A Little Help needed moving to and from a bed to a chair (including a wheelchair)?: A Little Help needed standing up from a chair using your arms (e.g., wheelchair or bedside chair)?: A Little Help needed to walk in hospital room?: A Little Help needed climbing 3-5 steps with a railing? : A Little 6 Click Score: 18    End of Session Equipment Utilized During Treatment: Gait belt Activity Tolerance: Patient tolerated treatment well Patient left: in chair;with call bell/phone within reach;with chair alarm set Nurse Communication: Mobility status PT Visit Diagnosis: Other abnormalities of gait and mobility (R26.89);Difficulty in walking, not elsewhere classified (R26.2)    Time: 1005-1031 PT Time Calculation (min) (ACUTE ONLY): 26 min   Charges:   PT Evaluation $PT Eval Low Complexity: 1 Low PT Treatments $Gait Training: 8-22 mins PT General Charges $$ ACUTE PT VISIT: 1 Visit         Shreyansh Tiffany, PT  Acute Rehab Dept [facility]) [phone]  06/04/2023   [facility] 06/04/2023, 10:39 AM

## 2023-06-04 NOTE — Progress Notes (Signed)
PT TX NOTE  06/04/23 1500  PT Visit Information  Last PT Received On 06/04/23  Assistance Needed Pt progressing well, will benefit from close guarding for safety at home; reviewed gait/stairs and transfer safety this afternoon as well as THA HEP and progression. Pt feels ready to d/c with son's assist   Precautions  Precautions Fall  Restrictions  Weight Bearing Restrictions No  Other Position/Activity Restrictions WBAT  Pain Assessment  Pain Assessment 0-10  Pain Score 6  Pain Location left knee  Pain Descriptors / Indicators Aching;Sore  Pain Intervention(s) Limited activity within patient's tolerance;Monitored during session;Premedicated before session;Repositioned  Cognition  Arousal/Alertness Awake/alert  Behavior During Therapy WFL for tasks assessed/performed  Overall Cognitive Status Within Functional Limits for tasks assessed  Bed Mobility  General bed mobility comments in recliner  Transfers  Overall transfer level Needs assistance  Equipment used Rolling walker (2 wheels)  Transfers Sit to/from Stand  Sit to Stand Min guard  General transfer comment cues for hand placement and safety  Ambulation/Gait  Ambulation/Gait assistance Min guard  Gait Distance (Feet) 80 Feet  Assistive device Rolling walker (2 wheels)  Gait Pattern/deviations Step-through pattern;Step-to pattern  General Gait Details cues for sequence, to keep hands on RW and RW position from self  Stairs Yes  Stairs assistance Min guard  Stair Management One rail Right;Two rails;Forwards  Number of Stairs 3  General stair comments cues for sequence and safe technique, no LOB however needs min-guard assist for safety  PT - End of Session  Equipment Utilized During Treatment Gait belt  Activity Tolerance Patient tolerated treatment well  Patient left in chair;with call bell/phone within reach;with chair alarm set  Nurse Communication Mobility status   PT - Assessment/Plan  PT Plan Current plan remains  appropriate  PT Visit Diagnosis Other abnormalities of gait and mobility (R26.89);Difficulty in walking, not elsewhere classified (R26.2)  PT Frequency (ACUTE ONLY) 7X/week  Follow Up Recommendations Follow physician's recommendations for discharge plan and follow up therapies  Patient can return home with the following Help with stairs or ramp for entrance;Assistance with cooking/housework;Assist for transportation  PT equipment None recommended by PT  AM-PAC PT "6 Clicks" Mobility Outcome Measure (Version 2)  Help needed turning from your back to your side while in a flat bed without using bedrails? 3  Help needed moving from lying on your back to sitting on the side of a flat bed without using bedrails? 3  Help needed moving to and from a bed to a chair (including a wheelchair)? 3  Help needed standing up from a chair using your arms (e.g., wheelchair or bedside chair)? 3  Help needed to walk in hospital room? 3  Help needed climbing 3-5 steps with a railing?  3  6 Click Score 18  Consider Recommendation of Discharge To: Home with [facility]  Progressive Mobility  What is the highest level of mobility based on the progressive mobility assessment? Level 5 (Walks with assist in room/hall) - Balance while stepping forward/back and can walk in room with assist - Complete  PT Goal Progression  Progress towards PT goals Progressing toward goals  Acute Rehab PT Goals  PT Goal Formulation With patient  Time For Goal Achievement 06/11/23  Potential to Achieve Goals Good  PT Time Calculation  PT Start Time (ACUTE ONLY) 1458  PT Stop Time (ACUTE ONLY) 1526  PT Time Calculation (min) (ACUTE ONLY) 28 min  PT General Charges  $$ ACUTE PT VISIT 1 Visit  PT Treatments  $  Gait Training 23-37 mins   Delice Bison, PT  Acute Rehab Dept [facility]) [phone]  06/04/2023

## 2023-06-04 NOTE — Progress Notes (Signed)
   Subjective: 1 Day Post-Op Procedure(s) (LRB): TOTAL HIP ARTHROPLASTY ANTERIOR APPROACH (Left) Patient reports pain as mild.   Patient seen in rounds for Dr. Charlann Boxer. Patient is resting in bed on exam this morning. No acute events overnight. Foley catheter to be removed. Patient has not been up with PT yet.  We will start therapy today.   Objective: Vital signs in last 24 hours: Temp:  [97.5 F (36.4 C)-99.1 F (37.3 C)] 97.9 F (36.6 C) (07/17 0655) Pulse Rate:  [57-66] 66 (07/17 0655) Resp:  [15-18] 17 (07/17 0655) BP: (120-160)/(53-98) 120/62 (07/17 0655) SpO2:  [92 %-100 %] 92 % (07/17 0655) Weight:  [83 kg] 83 kg (07/16 1335)  Intake/Output from previous day:  Intake/Output Summary (Last 24 hours) at 06/04/2023 0751 Last data filed at 06/04/2023 0635 Gross per 24 hour  Intake 2670.81 ml  Output 1500 ml  Net 1170.81 ml     Intake/Output this shift: No intake/output data recorded.  Labs: Recent Labs    06/04/23 0340  HGB 11.1*   Recent Labs    06/04/23 0340  WBC 10.6*  RBC 3.35*  HCT 33.5*  PLT 167   Recent Labs    06/04/23 0340  NA 137  K 3.8  CL 105  CO2 24  BUN 15  CREATININE 1.14*  GLUCOSE 170*  CALCIUM 8.4*   No results for input(s): "LABPT", "INR" in the last 72 hours.  Exam: General - Patient is Alert and Oriented Extremity - Neurologically intact Sensation intact distally Intact pulses distally Dorsiflexion/Plantar flexion intact Dressing - dressing C/D/I Motor Function - intact, moving foot and toes well on exam.   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   Weight as of this encounter: 83 kg. Advance diet Up with therapy D/C IV fluids  DVT Prophylaxis - Aspirin Weight bearing as tolerated.  Hgb stable at 11.1 this AM. Cr. Slightly up at 1.11.   Plan is to go Home after hospital stay. Plan for discharge today after meeting goals with therapy. Follow up in the office in 2 weeks.   Rosalene Billings, PA-C Orthopedic Surgery [phone] 06/04/2023, 7:51 AM

## 2023-06-04 NOTE — Care Management Obs Status (Signed)
MEDICARE OBSERVATION STATUS NOTIFICATION   Patient Details  Name: Samantha Jenkins MRN: [id number] Date of Birth: 08/02/1941   Medicare Observation Status Notification Given:  Yes    Ewing Schlein, LCSW 06/04/2023, 1:34 PM

## 2023-06-04 NOTE — TOC Transition Note (Signed)
Transition of Care [facility]) - CM/SW Discharge Note  Patient Details  Name: Samantha Jenkins MRN: [id number] Date of Birth: 1941/10/22  [facility] CM/SW Contact:  Ewing Schlein, LCSW Phone Number: 06/04/2023, 10:26 AM  Clinical Narrative: Patient is expected to discharge home after working with PT. CSW met with patient to confirm discharge plan. Patient will go home with a home exercise program (HEP). Patient has a rolling walker at home, so there are no DME needs at this time. TOC signing off.   Final next level of care: Home/Self Care Barriers to Discharge: No Barriers Identified  Patient Goals and CMS Choice Choice offered to / list presented to : NA  Discharge Plan and Services Additional resources added to the After Visit Summary for         DME Arranged: N/A DME Agency: NA  Social Determinants of Health (SDOH) Interventions SDOH Screenings   Food Insecurity: No Food Insecurity (06/03/2023)  Housing: Low Risk  (06/03/2023)  Transportation Needs: No Transportation Needs (06/03/2023)  Utilities: Not At Risk (06/03/2023)  Tobacco Use: Medium Risk (06/03/2023)   Readmission Risk Interventions     No data to display

## 2023-06-13 ENCOUNTER — Ambulatory Visit: Payer: PPO | Admitting: Physician Assistant

## 2023-06-17 NOTE — Discharge Summary (Signed)
Patient ID: Samantha Jenkins MRN: [id number] DOB/AGE: 02-21-1941 82 y.o.  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 female who was admitted 06/03/2023 for operative treatment ofS/P total left hip arthroplasty. Patient has severe unremitting pain that affects sleep, daily activities, and work/hobbies. After pre-op clearance the patient was taken to the operating room on 06/03/2023 and underwent  Procedure(s): TOTAL HIP ARTHROPLASTY ANTERIOR APPROACH.    Patient was given perioperative antibiotics:  Anti-infectives (From admission, onward)    Start     Dose/Rate Route Frequency Ordered Stop   06/03/23 2130  ceFAZolin (ANCEF) IVPB 2g/100 mL premix        2 g 200 mL/hr over 30 Minutes Intravenous Every 6 hours 06/03/23 1825 06/04/23 0428   06/03/23 1330  ceFAZolin (ANCEF) IVPB 2g/100 mL premix        2 g 200 mL/hr over 30 Minutes Intravenous On call to O.R. 06/03/23 1317 06/03/23 1534        Patient was given sequential compression devices, early ambulation, and chemoprophylaxis to prevent DVT. Patient worked with PT and was meeting their goals regarding safe ambulation and transfers.  Patient benefited maximally from hospital stay and there  were no complications.    Recent vital signs: No data found.   Recent laboratory studies: No results for input(s): "WBC", "HGB", "HCT", "PLT", "NA", "K", "CL", "CO2", "BUN", "CREATININE", "GLUCOSE", "INR", "CALCIUM" in the last 72 hours.  Invalid input(s): "PT", "2"   Discharge Medications:   Allergies as of 06/04/2023       Reactions   Statins    Muscle aches can take pravastatin   Sulfa Antibiotics Other (See Comments)   Blood in urine        Medication List     STOP taking these medications    acetaminophen 650 MG CR tablet Commonly known as: TYLENOL       TAKE these medications    albuterol 108 (90 Base) MCG/ACT inhaler Commonly known as: VENTOLIN HFA Inhale 2 puffs into the lungs every 6 (six) hours as needed for wheezing or shortness of breath.   aspirin 81 MG chewable tablet Chew 1 tablet (81 mg total) by mouth 2 (two) times daily for 28 days.   bacitracin-polymyxin b ointment Commonly known as: POLYSPORIN Apply 1 application topically daily as needed (wound care).   Benefiber Powd Take 1 Dose by mouth daily. 1 dose = 1 tablespoon   bisoprolol-hydrochlorothiazide 5-6.25 MG tablet Commonly known as: ZIAC TAKE 1 TABLET BY MOUTH DAILY. What changed: when to take this   diphenhydramine-acetaminophen 25-500 MG Tabs  tablet Commonly known as: TYLENOL PM Take 2 tablets by mouth at bedtime as needed (sleep).   HYDROcodone-acetaminophen 7.5-325 MG tablet Commonly known as: NORCO Take 1 tablet by mouth every 4 (four) hours as needed for severe pain.   meloxicam 15 MG tablet Commonly known as: MOBIC Take 1 tablet (15 mg total) by mouth daily.   methocarbamol 500 MG tablet Commonly known as: ROBAXIN Take 1 tablet (500 mg total) by mouth every 6 (six) hours as needed for muscle spasms.   polyethylene glycol 17 g packet Commonly known as: MIRALAX / GLYCOLAX Take 17 g by mouth 2 (two) times daily.   pravastatin 40 MG tablet Commonly known as:  PRAVACHOL TAKE 1 TABLET EVERY DAY What changed: when to take this   senna 8.6 MG Tabs tablet Commonly known as: SENOKOT Take 2 tablets (17.2 mg total) by mouth at bedtime for 14 days.   sertraline 100 MG tablet Commonly known as: ZOLOFT TAKE 1 TABLET EVERY DAY What changed:  how much to take when to take this   Systane Complete PF 0.6 % Soln Generic drug: Propylene Glycol (PF) Place 1 drop into both eyes daily.   Voltaren 1 % Gel Generic drug: diclofenac Sodium Apply 1 Application topically 2 (two) times daily.               Discharge Care Instructions  (From admission, onward)           Start     Ordered   06/04/23 0000  Change dressing       Comments: Maintain surgical dressing until follow up in the clinic. If the edges start to pull up, may reinforce with tape. If the dressing is no longer working, may remove and cover with gauze and tape, but must keep the area dry and clean.  Call with any questions or concerns.   06/04/23 0753            Diagnostic Studies: DG Pelvis Portable  Result Date: 06/03/2023 CLINICAL DATA:  Status post hip arthroplasty. EXAM: PORTABLE PELVIS 1-2 VIEWS COMPARISON:  None Available. FINDINGS: Status post left hip arthroplasty with intact hardware. Subcutaneous emphysema about the left hip joint. There is no evidence of pelvic fracture or diastasis. No pelvic bone lesions are seen. IMPRESSION: Status post left hip arthroplasty with intact hardware. Electronically Signed   By: Larose Hires D.O.   On: 06/03/2023 21:37   DG HIP UNILAT WITH PELVIS 1V LEFT  Result Date: 06/03/2023 CLINICAL DATA:  Status post total hip arthroplasty. EXAM: DG HIP (WITH OR WITHOUT PELVIS) 1V*L* COMPARISON:  None Available. FINDINGS: Two fluoroscopic images were obtained intraoperatively. Total fluoroscopy time is 24 seconds. Dose: 3.1 mGy. Images demonstrate total hip arthroplasty changes and cerclage wire on the left. Please see operative report for  additional information. IMPRESSION: Intraoperative utilization of fluoroscopy. Electronically Signed   By: Thornell Sartorius M.D.   On: 06/03/2023 21:01   DG C-Arm 1-60 Min-No Report  Result Date: 06/03/2023 Fluoroscopy was utilized by the requesting physician.  No radiographic interpretation.   DG C-Arm 1-60 Min-No Report  Result Date: 06/03/2023 Fluoroscopy was utilized by the requesting physician.  No radiographic interpretation.    Disposition: Discharge disposition: 01-Home or Self Care       Discharge Instructions     Call MD / Call 911   Complete by: As directed    If you experience chest pain or shortness of breath, CALL 911 and be transported to the hospital emergency room.  If you develope a fever above 101 F, pus (white drainage) or increased drainage or redness at the wound, or calf pain, call your surgeon's office.   Change dressing   Complete by: As directed    Maintain surgical dressing until follow up in the clinic. If the edges start to pull up, may reinforce with tape. If the dressing is no longer working, may remove and cover with gauze and tape, but must keep the area dry and clean.  Call with any questions or concerns.   Constipation Prevention   Complete by: As directed    Drink plenty of fluids.  Prune juice may be helpful.  You may use a stool softener, such as Colace (over the counter) 100 mg twice a day.  Use MiraLax (over the counter) for constipation as needed.   Diet - low sodium heart healthy   Complete by: As directed    Increase activity slowly as tolerated   Complete by: As directed    Weight bearing as tolerated with assist device (walker, cane, etc) as directed, use it as long as suggested by your surgeon or therapist, typically at least 4-6 weeks.   Post-operative opioid taper instructions:   Complete by: As directed    POST-OPERATIVE OPIOID TAPER INSTRUCTIONS: It is important to wean off of your opioid medication as soon as possible. If you do not  need pain medication after your surgery it is ok to stop day one. Opioids include: Codeine, Hydrocodone(Norco, Vicodin), Oxycodone(Percocet, oxycontin) and hydromorphone amongst others.  Long term and even short term use of opiods can cause: Increased pain response Dependence Constipation Depression Respiratory depression And more.  Withdrawal symptoms can include Flu like symptoms Nausea, vomiting And more Techniques to manage these symptoms Hydrate well Eat regular healthy meals Stay active Use relaxation techniques(deep breathing, meditating, yoga) Do Not substitute Alcohol to help with tapering If you have been on opioids for less than two weeks and do not have pain than it is ok to stop all together.  Plan to wean off of opioids This plan should start within one week post op of your joint replacement. Maintain the same interval or time between taking each dose and first decrease the dose.  Cut the total daily intake of opioids by one tablet each day Next start to increase the time between doses. The last dose that should be eliminated is the evening dose.      TED hose   Complete by: As directed    Use stockings (TED hose) for 2 weeks on both leg(s).  You may remove them at night for sleeping.        Follow-up Information     Durene Romans, MD. Schedule an appointment as soon as possible for a visit in 2 week(s).   Specialty: Orthopedic Surgery Contact information: [address] [place] [address] [phone]                  Signed: Cassandria Anger 06/17/2023, 8:57 AM

## 2023-09-05 ENCOUNTER — Telehealth: Payer: Self-pay | Admitting: Nurse Practitioner

## 2023-09-05 NOTE — Telephone Encounter (Signed)
Patient would like to speak with a nurse states her rectal bleeding is getting worst.

## 2023-09-08 NOTE — Telephone Encounter (Signed)
Pt stated that she started having dots of blood a few months ago which has increased as time went on. Separate from BM. Bright red blood. Pt stated that the consistency is honey or syrup; having 1-2 BM a day.  Pt was scheduled for an office visit to see Dr. Marina Goodell on 09/09/2023 at 9:00 AM. Pt made aware.  Pt verbalized understanding with all questions answered.

## 2023-09-09 ENCOUNTER — Encounter: Payer: Self-pay | Admitting: Internal Medicine

## 2023-09-09 ENCOUNTER — Ambulatory Visit (INDEPENDENT_AMBULATORY_CARE_PROVIDER_SITE_OTHER): Payer: PPO | Admitting: Internal Medicine

## 2023-09-09 VITALS — BP 150/78 | HR 54 | Ht 65.0 in | Wt 181.0 lb

## 2023-09-09 DIAGNOSIS — K625 Hemorrhage of anus and rectum: Secondary | ICD-10-CM

## 2023-09-09 MED ORDER — NA SULFATE-K SULFATE-MG SULF 17.5-3.13-1.6 GM/177ML PO SOLN: 1.0000 | Freq: Once | ORAL | 0 refills | Status: AC

## 2023-09-09 NOTE — Progress Notes (Signed)
HISTORY OF PRESENT ILLNESS:  Samantha Jenkins is a 82 y.o. female with past medical history as listed below who presents today regarding recurrent rectal bleeding.  Patient was seen as a new patient in this office by the GI nurse practitioner Apr 15, 2023 regarding rectal bleeding.  See that dictation for details.  Colonoscopy was recommended.  However, the patient wished to pursue this after her hip surgery, which she had successfully.  She presents today for follow-up.  The patient states that she has had increased rectal bleeding.  It is bright red and mucoid appearing.  She denies any rectal pain as previous.  No lower abdominal pain.  Weight is stable.  Reports previous colonoscopy elsewhere about 20 years ago has been normal.  She describes her bowel habits as alternating.  This is unchanged for her.  Review of blood work from May 21, 2023 shows a normal hemoglobin of 13.4.  REVIEW OF SYSTEMS:  All non-GI ROS negative unless otherwise stated in the HPI except for sinus allergy trouble, anxiety, depression, arthritis, visual change, cough, fever, headaches, hearing problems, urinary frequency, urinary leakage, excessive urination, lower extremity swelling, and sleeping problems  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   Depression    Dysthymia    FHx: cardiovascular disease    History of blood transfusion as child   History of kidney stones    Hyperlipidemia    Hypertension    LBBB (left bundle branch block) 2014   on ekg   Obesity    Pneumonia    hx of as a child   Vitamin D deficiency     Past Surgical History:  Procedure Laterality Date   ABDOMINAL HYSTERECTOMY  1986   partial   APPENDECTOMY  1986   with hysterectomy   CATARACT EXTRACTION Bilateral 2016   CYSTOSCOPY/URETEROSCOPY/HOLMIUM LASER/STENT PLACEMENT Left 02/09/2021   Procedure: LEFT URETEROSCOPY/  LASER LITHOTRIPSY  AND  STENT PLACEMENT,ENDOPYLOTOMY;  Surgeon: Crist Fat, MD;  Location: [facility];  Service: Urology;  Laterality: Left;   CYSTOSCOPY/URETEROSCOPY/HOLMIUM LASER/STENT PLACEMENT Left 03/15/2021   Procedure: CYSTOSCOPY, LEFT URETEROSCOPY, HOLMIUM LASER ENDOPYELOTOMY, URETERAL STENT PLACEMENT;  Surgeon: Crist Fat, MD;  Location: [facility];  Service: Urology;  Laterality: Left;   EYE SURGERY  2016   bil cataracts   HOLMIUM LASER APPLICATION Left 02/09/2021   Procedure: HOLMIUM LASER APPLICATION;  Surgeon: Crist Fat, MD;  Location: [facility];  Service: Urology;  Laterality: Left;   KNEE ARTHROSCOPY  1995   left   NEPHROLITHOTOMY Left 03/23/2020   Procedure: LEFT NEPHROLITHOTOMY PERCUTANEOUS WITH SURGEON ACCESS;  Surgeon: Crist Fat, MD;  Location: [facility];  Service: Urology;  Laterality: Left;   TONSILLECTOMY  1945   adenoids removed   TOTAL HIP ARTHROPLASTY Left 06/03/2023   Procedure: TOTAL HIP ARTHROPLASTY ANTERIOR APPROACH;  Surgeon: Durene Romans, MD;  Location: [facility];  Service: Orthopedics;  Laterality: Left;   TOTAL KNEE ARTHROPLASTY Right 06/06/2020   Procedure: TOTAL KNEE ARTHROPLASTY;  Surgeon: Durene Romans, MD;  Location: [facility];  Service: Orthopedics;  Laterality: Right;  70 mins   TOTAL KNEE ARTHROPLASTY Left 07/17/2021   Procedure: TOTAL KNEE ARTHROPLASTY;  Surgeon: Durene Romans, MD;  Location: [facility];  Service: Orthopedics;  Laterality: Left;   TUBAL LIGATION  1979    Social History CUBIE HARGROVE  reports that she quit smoking about 56 years ago. Her smoking use included cigarettes. She started smoking about 66 years ago. She has a 5 pack-year smoking history. She has never used smokeless tobacco. She reports current alcohol use of about 1.0 standard drink of alcohol per week. She reports that she does not use drugs.  family history includes Breast cancer in her mother; Heart disease in her  father.  Allergies  Allergen Reactions   Statins     Muscle aches can take pravastatin   Sulfa Antibiotics Other (See Comments)    Blood in urine       PHYSICAL EXAMINATION: Vital signs: BP (!) 150/78   Pulse (!) 54   Ht 5\' 5"  (1.651 m)   Wt 181 lb (82.1 kg)   BMI 30.12 kg/m   Constitutional: generally well-appearing, no acute distress Psychiatric: alert and oriented x3, cooperative Eyes: extraocular movements intact, anicteric, conjunctiva pink Mouth: oral pharynx moist, no lesions Neck: supple no lymphadenopathy Cardiovascular: heart regular rate and rhythm, no murmur Lungs: clear to auscultation bilaterally Abdomen: soft, nontender, nondistended, no obvious ascites, no peritoneal signs, normal bowel sounds, no organomegaly Rectal: Deferred until colonoscopy Extremities: no clubbing, cyanosis, or lower extremity edema bilaterally Skin: no lesions on visible extremities Neuro: No focal deficits.  Cranial nerves intact  ASSESSMENT:  1.  Ongoing intermittent rectal bleeding of uncertain etiology.  Rule out neoplasia.  Rule out benign anorectal causes 2.  General Medical problems.  Stable 3.  Advanced age.  Active   PLAN:  1.  Colonoscopy to evaluate rectal bleeding.The nature of the procedure, as well as the risks, benefits, and alternatives were carefully and thoroughly reviewed with the patient. Ample time for discussion and questions allowed. The patient understood, was satisfied, and agreed to proceed. 2.  Further recommendations after the above A total time of 30 minutes was spent preparing to see the patient, obtaining interval history, performing medically appropriate physical examination, counseling and educating the patient regarding the above listed issues, ordering endoscopic procedure, and documenting clinical information in the health record

## 2023-09-09 NOTE — Patient Instructions (Signed)
You have been scheduled for a colonoscopy. Please follow written instructions given to you at your visit today.   Please pick up your prep supplies at the pharmacy within the next 1-3 days.  If you use inhalers (even only as needed), please bring them with you on the day of your procedure.  DO NOT TAKE 7 DAYS PRIOR TO TEST- Trulicity (dulaglutide) Ozempic, Wegovy (semaglutide) Mounjaro (tirzepatide) Bydureon Bcise (exanatide extended release)  DO NOT TAKE 1 DAY PRIOR TO YOUR TEST Rybelsus (semaglutide) Adlyxin (lixisenatide) Victoza (liraglutide) Byetta (exanatide) ___________________________________________________________________________ _______________________________________________________  If your blood pressure at your visit was 140/90 or greater, please contact your primary care physician to follow up on this.  _______________________________________________________  If you are age 79 or older, your body mass index should be between 23-30. Your Body mass index is 30.12 kg/m. If this is out of the aforementioned range listed, please consider follow up with your Primary Care Provider.  If you are age 66 or younger, your body mass index should be between 19-25. Your Body mass index is 30.12 kg/m. If this is out of the aformentioned range listed, please consider follow up with your Primary Care Provider.   ________________________________________________________  The [place] [facility] providers would like to encourage you to use [facility] to communicate with providers for non-urgent requests or questions.  Due to long hold times on the telephone, sending your provider a message by [facility] may be a faster and more efficient way to get a response.  Please allow 48 business hours for a response.  Please remember that this is for non-urgent requests.  _______________________________________________________

## 2023-09-19 ENCOUNTER — Encounter: Payer: Self-pay | Admitting: Internal Medicine

## 2023-09-19 ENCOUNTER — Ambulatory Visit: Payer: PPO | Admitting: Internal Medicine

## 2023-09-19 VITALS — BP 100/64 | HR 91 | Temp 97.3°F | Resp 12 | Ht 65.0 in | Wt 181.0 lb

## 2023-09-19 DIAGNOSIS — C20 Malignant neoplasm of rectum: Secondary | ICD-10-CM

## 2023-09-19 DIAGNOSIS — D122 Benign neoplasm of ascending colon: Secondary | ICD-10-CM

## 2023-09-19 DIAGNOSIS — K6289 Other specified diseases of anus and rectum: Secondary | ICD-10-CM

## 2023-09-19 DIAGNOSIS — K625 Hemorrhage of anus and rectum: Secondary | ICD-10-CM

## 2023-09-19 MED ORDER — SODIUM CHLORIDE 0.9 % IV SOLN: 500.0000 mL | Freq: Once | INTRAVENOUS | Status: DC

## 2023-09-19 NOTE — Progress Notes (Signed)
Pt's states no medical or surgical changes since previsit or office visit. 

## 2023-09-19 NOTE — Progress Notes (Unsigned)
Expand All Collapse All HISTORY OF PRESENT ILLNESS:   Samantha Jenkins is a 82 y.o. female with past medical history as listed below who presents today regarding recurrent rectal bleeding.  Patient was seen as a new patient in this office by the GI nurse practitioner Apr 15, 2023 regarding rectal bleeding.  See that dictation for details.  Colonoscopy was recommended.  However, the patient wished to pursue this after her hip surgery, which she had successfully.  She presents today for follow-up.   The patient states that she has had increased rectal bleeding.  It is bright red and mucoid appearing.  She denies any rectal pain as previous.  No lower abdominal pain.  Weight is stable.  Reports previous colonoscopy elsewhere about 20 years ago has been normal.  She describes her bowel habits as alternating.  This is unchanged for her.   Review of blood work from May 21, 2023 shows a normal hemoglobin of 13.4.   REVIEW OF SYSTEMS:   All non-GI ROS negative unless otherwise stated in the HPI except for sinus allergy trouble, anxiety, depression, arthritis, visual change, cough, fever, headaches, hearing problems, urinary frequency, urinary leakage, excessive urination, lower extremity swelling, and sleeping problems       Past Medical History:  Diagnosis Date   Anxiety     Arthritis      knees ? 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    back   Depression     Dysthymia     FHx: cardiovascular disease     History of blood transfusion as child   History of kidney stones     Hyperlipidemia     Hypertension     LBBB (left bundle branch block) 2014    on ekg   Obesity     Pneumonia      hx of as a child   Vitamin D deficiency                 Past Surgical History:  Procedure Laterality Date   ABDOMINAL HYSTERECTOMY   1986    partial   APPENDECTOMY   1986    with hysterectomy   CATARACT EXTRACTION Bilateral 2016   CYSTOSCOPY/URETEROSCOPY/HOLMIUM  LASER/STENT PLACEMENT Left 02/09/2021    Procedure: LEFT URETEROSCOPY/ LASER LITHOTRIPSY  AND  STENT PLACEMENT,ENDOPYLOTOMY;  Surgeon: Crist Fat, MD;  Location: [facility];  Service: Urology;  Laterality: Left;   CYSTOSCOPY/URETEROSCOPY/HOLMIUM LASER/STENT PLACEMENT Left 03/15/2021    Procedure: CYSTOSCOPY, LEFT URETEROSCOPY, HOLMIUM LASER ENDOPYELOTOMY, URETERAL STENT PLACEMENT;  Surgeon: Crist Fat, MD;  Location[facility];  Service: Urology;  Laterality: Left;   EYE SURGERY   2016    bil cataracts   HOLMIUM LASER APPLICATION Left 02/09/2021    Procedure: HOLMIUM LASER APPLICATION;  Surgeon: Crist Fat, MD;  [facility];  Service: Urology;  Laterality: Left;   KNEE ARTHROSCOPY   1995    left   NEPHROLITHOTOMY Left 03/23/2020    Procedure: LEFT NEPHROLITHOTOMY PERCUTANEOUS WITH SURGEON ACCESS;  Surgeon: Crist Fat, MD;  Location: [facility];  Service: Urology;  Laterality: Left;   TONSILLECTOMY   1945    adenoids removed   TOTAL HIP ARTHROPLASTY Left 06/03/2023    Procedure: TOTAL HIP ARTHROPLASTY ANTERIOR APPROACH;  Surgeon: Durene Romans, MD;  Location: [facility];  Service: Orthopedics;  Laterality: Left;   TOTAL KNEE ARTHROPLASTY Right 06/06/2020  Procedure: TOTAL KNEE ARTHROPLASTY;  Surgeon: Durene Romans, MD;  Location[facility];  Service: Orthopedics;  Laterality: Right;  70 mins   TOTAL KNEE ARTHROPLASTY Left 07/17/2021    Procedure: TOTAL KNEE ARTHROPLASTY;  Surgeon: Durene Romans, MD;  Location: [facility];  Service: Orthopedics;  Laterality: Left;   TUBAL LIGATION   1979          Social History Samantha Jenkins  reports that she quit smoking about 56 years ago. Her smoking use included cigarettes. She started smoking about 66 years ago. She has a 5 pack-year smoking history. She has never used smokeless tobacco. She reports current alcohol use of about 1.0 standard drink of alcohol per week. She reports that she does not use  drugs.   family history includes Breast cancer in her mother; Heart disease in her father.   Allergies       Allergies  Allergen Reactions   Statins        Muscle aches can take pravastatin   Sulfa Antibiotics Other (See Comments)      Blood in urine            PHYSICAL EXAMINATION: Vital signs: BP (!) 150/78   Pulse (!) 54   Ht 5\' 5"  (1.651 m)   Wt 181 lb (82.1 kg)   BMI 30.12 kg/m   Constitutional: generally well-appearing, no acute distress Psychiatric: alert and oriented x3, cooperative Eyes: extraocular movements intact, anicteric, conjunctiva pink Mouth: oral pharynx moist, no lesions Neck: supple no lymphadenopathy Cardiovascular: heart regular rate and rhythm, no murmur Lungs: clear to auscultation bilaterally Abdomen: soft, nontender, nondistended, no obvious ascites, no peritoneal signs, normal bowel sounds, no organomegaly Rectal: Deferred until colonoscopy Extremities: no clubbing, cyanosis, or lower extremity edema bilaterally Skin: no lesions on visible extremities Neuro: No focal deficits.  Cranial nerves intact   ASSESSMENT:   1.  Ongoing intermittent rectal bleeding of uncertain etiology.  Rule out neoplasia.  Rule out benign anorectal causes 2.  General Medical problems.  Stable 3.  Advanced age.  Active     PLAN:   1.  Colonoscopy to evaluate rectal bleeding.The nature of the procedure, as well as the risks, benefits, and alternatives were carefully and thoroughly reviewed with the patient. Ample time for discussion and questions allowed. The patient understood, was satisfied, and agreed to proceed. 2.  Further recommendations after the above

## 2023-09-19 NOTE — Progress Notes (Unsigned)
Called to room to assist during endoscopic procedure.  Patient ID and intended procedure confirmed with present staff. Received instructions for my participation in the procedure from the performing physician.  

## 2023-09-19 NOTE — Op Note (Signed)
[facility] Patient Name: Samantha Jenkins Procedure Date: 09/19/2023 3:44 PM MRN: [id number] Endoscopist: Wilhemina Bonito. Marina Goodell , MD, [id number] Age: 82 Referring MD:  Date of Birth: August 18, 1941 Gender: Female Account #: [id number] Procedure:                Colonoscopy with cold snare polypectomy x 1; with                            biopsies Indications:              Rectal bleeding Medicines:                Monitored Anesthesia Care Procedure:                Pre-Anesthesia Assessment:                           - Prior to the procedure, a History and Physical                            was performed, and patient medications and                            allergies were reviewed. The patient's tolerance of                            previous anesthesia was also reviewed. The risks                            and benefits of the procedure and the sedation                            options and risks were discussed with the patient.                            All questions were answered, and informed consent                            was obtained. Prior Anticoagulants: The patient has                            taken no anticoagulant or antiplatelet agents. ASA                            Grade Assessment: II - A patient with mild systemic                            disease. After reviewing the risks and benefits,                            the patient was deemed in satisfactory condition to                            undergo the procedure.  After obtaining informed consent, the colonoscope                            was passed under direct vision. Throughout the                            procedure, the patient's blood pressure, pulse, and                            oxygen saturations were monitored continuously. The                            Olympus Scope SN: [id number] was introduced through                            the anus and advanced to the the cecum, identified                             by appendiceal orifice and ileocecal valve. The                            ileocecal valve, appendiceal orifice, and rectum                            were photographed. The quality of the bowel                            preparation was excellent. The colonoscopy was                            performed without difficulty. The patient tolerated                            the procedure well. The bowel preparation used was                            SUPREP via split dose instruction. Scope In: 3:48:50 PM Scope Out: 4:06:30 PM Scope Withdrawal Time: 0 hours 12 minutes 41 seconds  Total Procedure Duration: 0 hours 17 minutes 40 seconds  Findings:                 An ulcerated non-obstructing large mass was found                            in the distal rectum. The mass was 4 to 5 cm in                            size, friable, and partially circumferential                            (involving one-half of the lumen circumference). It                            was located approximately 2-3  cm from the anal                            verge this was biopsied with a cold forceps for                            histology.                           A 3 mm polyp was found in the ascending colon. The                            polyp was removed with a cold snare. Resection and                            retrieval were complete.                           Multiple diverticula were found in the left colon.                           The exam was otherwise without abnormality. Complications:            No immediate complications. Estimated blood loss:                            None. Estimated Blood Loss:     Estimated blood loss: none. 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                           - One 3 mm polyp in the ascending colon, removed                            with a cold snare. Resected and retrieved.                            - Diverticulosis in the left colon.                           - The examination was otherwise normal . Recommendation:           - Repeat colonoscopy (date not yet determined) for                            surveillance.                           - Patient has a contact number available for                            emergencies. The signs and symptoms of potential                            delayed complications were discussed with  the                            patient. Return to normal activities tomorrow.                            Written discharge instructions were provided to the                            patient.                           - Resume previous diet.                           - Continue present medications.                           - Await pathology results.                           - PLEASE SCHEDULE contrast-enhanced CT scan of the                            chest, abdomen, and pelvis "rectal cancer, rule out                            metastatic disease"                           - PLEASE ARRANGE outpatient appointment with                            oncology through GI tumor navigator Wilhemina Bonito. Marina Goodell, MD 09/19/2023 4:15:40 PM This report has been signed electronically.

## 2023-09-19 NOTE — Patient Instructions (Addendum)
-   Resume previous diet. - Continue present medications. - Await pathology results. - PLEASE SCHEDULE contrast-enhanced CT scan of the chest, abdomen, and pelvis "rectal cancer, rule out metastatic disease" - PLEASE ARRANGE outpatient appointment with oncology through GI tumor navigator   YOU HAD AN ENDOSCOPIC PROCEDURE TODAY AT THE [facility]:   Refer to the procedure report that was given to you for any specific questions about what was found during the examination.  If the procedure report does not answer your questions, please call your gastroenterologist to clarify.  If you requested that your care partner not be given the details of your procedure findings, then the procedure report has been included in a sealed envelope for you to review at your convenience later.  YOU SHOULD EXPECT: Some feelings of bloating in the abdomen. Passage of more gas than usual.  Walking can help get rid of the air that was put into your GI tract during the procedure and reduce the bloating. If you had a lower endoscopy (such as a colonoscopy or flexible sigmoidoscopy) you may notice spotting of blood in your stool or on the toilet paper. If you underwent a bowel prep for your procedure, you may not have a normal bowel movement for a few days.  Please Note:  You might notice some irritation and congestion in your nose or some drainage.  This is from the oxygen used during your procedure.  There is no need for concern and it should clear up in a day or so.  SYMPTOMS TO REPORT IMMEDIATELY:  Following lower endoscopy (colonoscopy or flexible sigmoidoscopy):  Excessive amounts of blood in the stool  Significant tenderness or worsening of abdominal pains  Swelling of the abdomen that is new, acute  Fever of 100F or higher  For urgent or emergent issues, a gastroenterologist can be reached at any hour by calling [phone]. Do not use MyChart messaging for urgent concerns.    DIET:  We do  recommend a small meal at first, but then you may proceed to your regular diet.  Drink plenty of fluids but you should avoid alcoholic beverages for 24 hours.  ACTIVITY:  You should plan to take it easy for the rest of today and you should NOT DRIVE or use heavy machinery until tomorrow (because of the sedation medicines used during the test).    FOLLOW UP: Our staff will call the number listed on your records the next business day following your procedure.  We will call around 7:15- 8:00 am to check on you and address any questions or concerns that you may have regarding the information given to you following your procedure. If we do not reach you, we will leave a message.     If any biopsies were taken you will be contacted by phone or by letter within the next 1-3 weeks.  Please call us at [phone] if you have not heard about the biopsies in 3 weeks.    SIGNATURES/CONFIDENTIALITY: You and/or your care partner have signed paperwork which will be entered into your electronic medical record.  These signatures attest to the fact that that the information above on your After Visit Summary has been reviewed and is understood.  Full responsibility of the confidentiality of this discharge information lies with you and/or your care-partner.

## 2023-09-22 ENCOUNTER — Telehealth: Payer: Self-pay

## 2023-09-22 DIAGNOSIS — K6289 Other specified diseases of anus and rectum: Secondary | ICD-10-CM

## 2023-09-22 LAB — SURGICAL PATHOLOGY

## 2023-09-22 NOTE — Telephone Encounter (Signed)
Spoke to patient to let her know her ct chest/abd/pelvis was scheduled for 09/24/2023.  She needs to come by the office before then to get a BMET.  I also put in a referral to oncology and sent a message to nurse navigator.  Patient acknowledged and understood.

## 2023-09-22 NOTE — Telephone Encounter (Signed)
Noted! Thank you

## 2023-09-22 NOTE — Telephone Encounter (Signed)
  Follow up Call-     09/19/2023    2:44 PM  Call back number  Post procedure Call Back phone  # [phone]  Permission to leave phone message Yes     Patient questions:  Do you have a fever, pain , or abdominal swelling? No. Pain Score  0 *  Have you tolerated food without any problems? Yes.    Have you been able to return to your normal activities? Yes.    Do you have any questions about your discharge instructions: Diet   No. Medications  No. Follow up visit  No.  Do you have questions or concerns about your Care? No.  Actions: * If pain score is 4 or above: No action needed, pain <4.

## 2023-09-23 ENCOUNTER — Other Ambulatory Visit (INDEPENDENT_AMBULATORY_CARE_PROVIDER_SITE_OTHER): Payer: PPO

## 2023-09-23 DIAGNOSIS — K6289 Other specified diseases of anus and rectum: Secondary | ICD-10-CM

## 2023-09-23 LAB — BASIC METABOLIC PANEL
BUN: 11 mg/dL (ref 6–23)
CO2: 27 meq/L (ref 19–32)
Calcium: 9.3 mg/dL (ref 8.4–10.5)
Chloride: 107 meq/L (ref 96–112)
Creatinine, Ser: 0.92 mg/dL (ref 0.40–1.20)
GFR: 57.72 mL/min — ABNORMAL LOW (ref >60.00)
Glucose, Bld: 95 mg/dL (ref 70–99)
Potassium: 3.3 meq/L — ABNORMAL LOW (ref 3.5–5.1)
Sodium: 144 meq/L (ref 135–145)

## 2023-09-24 ENCOUNTER — Ambulatory Visit (HOSPITAL_COMMUNITY)
Admission: RE | Admit: 2023-09-24 | Discharge: 2023-09-24 | Disposition: A | Discharge status: Home / Self Care | Payer: PPO | Source: Ambulatory Visit | Attending: Internal Medicine | Admitting: Internal Medicine

## 2023-09-24 DIAGNOSIS — C20 Malignant neoplasm of rectum: Secondary | ICD-10-CM | POA: Diagnosis not present

## 2023-09-24 DIAGNOSIS — K6289 Other specified diseases of anus and rectum: Secondary | ICD-10-CM | POA: Diagnosis present

## 2023-09-24 MED ORDER — IOHEXOL 300 MG/ML  SOLN
100.0000 mL | Freq: Once | INTRAMUSCULAR | Status: AC | PRN
  Administered 2023-09-24: 100 mL via INTRAVENOUS

## 2023-09-24 MED ORDER — IOHEXOL 300 MG/ML  SOLN
30.0000 mL | Freq: Once | INTRAMUSCULAR | Status: AC | PRN
  Administered 2023-09-24: 30 mL via ORAL

## 2023-10-02 ENCOUNTER — Inpatient Hospital Stay: Payer: PPO | Attending: Nurse Practitioner

## 2023-10-02 ENCOUNTER — Inpatient Hospital Stay: Payer: PPO | Admitting: Nurse Practitioner

## 2023-10-02 ENCOUNTER — Other Ambulatory Visit: Payer: Self-pay

## 2023-10-02 ENCOUNTER — Encounter: Payer: Self-pay | Admitting: Nurse Practitioner

## 2023-10-02 VITALS — BP 152/50 | HR 61 | Temp 97.9°F | Resp 17 | Wt 182.1 lb

## 2023-10-02 DIAGNOSIS — N132 Hydronephrosis with renal and ureteral calculous obstruction: Secondary | ICD-10-CM | POA: Diagnosis not present

## 2023-10-02 DIAGNOSIS — I10 Essential (primary) hypertension: Secondary | ICD-10-CM | POA: Insufficient documentation

## 2023-10-02 DIAGNOSIS — Z87891 Personal history of nicotine dependence: Secondary | ICD-10-CM | POA: Diagnosis not present

## 2023-10-02 DIAGNOSIS — C779 Secondary and unspecified malignant neoplasm of lymph node, unspecified: Secondary | ICD-10-CM

## 2023-10-02 DIAGNOSIS — C20 Malignant neoplasm of rectum: Secondary | ICD-10-CM

## 2023-10-02 LAB — CBC WITH DIFFERENTIAL (CANCER CENTER ONLY)
Abs Immature Granulocytes: 0.01 10*3/uL (ref 0.00–0.07)
Basophils Absolute: 0 10*3/uL (ref 0.0–0.1)
Basophils Relative: 0 %
Eosinophils Absolute: 0.2 10*3/uL (ref 0.0–0.5)
Eosinophils Relative: 3 %
HCT: 39.3 % (ref 36.0–46.0)
Hemoglobin: 13.1 g/dL (ref 12.0–15.0)
Immature Granulocytes: 0 %
Lymphocytes Relative: 26 %
Lymphs Abs: 1.8 10*3/uL (ref 0.7–4.0)
MCH: 30 pg (ref 26.0–34.0)
MCHC: 33.3 g/dL (ref 30.0–36.0)
MCV: 89.9 fL (ref 80.0–100.0)
Monocytes Absolute: 0.5 10*3/uL (ref 0.1–1.0)
Monocytes Relative: 8 %
Neutro Abs: 4.2 10*3/uL (ref 1.7–7.7)
Neutrophils Relative %: 63 %
Platelet Count: 228 10*3/uL (ref 150–400)
RBC: 4.37 MIL/uL (ref 3.87–5.11)
RDW: 14.5 % (ref 11.5–15.5)
WBC Count: 6.7 10*3/uL (ref 4.0–10.5)
nRBC: 0 % (ref 0.0–0.2)

## 2023-10-02 LAB — CMP (CANCER CENTER ONLY)
ALT: 8 U/L (ref 0–44)
AST: 19 U/L (ref 15–41)
Albumin: 4 g/dL (ref 3.5–5.0)
Alkaline Phosphatase: 122 U/L (ref 38–126)
Anion gap: 7 (ref 5–15)
BUN: 12 mg/dL (ref 8–23)
CO2: 29 mmol/L (ref 22–32)
Calcium: 9.9 mg/dL (ref 8.9–10.3)
Chloride: 108 mmol/L (ref 98–111)
Creatinine: 0.96 mg/dL (ref 0.44–1.00)
GFR, Estimated: 59 mL/min — ABNORMAL LOW (ref >60)
Glucose, Bld: 97 mg/dL (ref 70–99)
Potassium: 3.9 mmol/L (ref 3.5–5.1)
Sodium: 144 mmol/L (ref 135–145)
Total Bilirubin: 0.7 mg/dL (ref <1.2)
Total Protein: 7.1 g/dL (ref 6.5–8.1)

## 2023-10-02 NOTE — Progress Notes (Addendum)
[facility]  [phone]   HEMATOLOGY ONCOLOGY CONSULTATION   Samantha Jenkins  DOB: 08/31/1941  MR#: [id number]  CSN#: [id number]    Requesting Physician: Dr. Awanda Mink GI  Patient Care Team: Irven Coe, MD as PCP - General (Family Medicine)  Reason for consult: rectal mass   History of present illness:   patient initially consulted with GI in 02/2023 due to rectal bleeding. Gradually became worse.  She now describes the bleeding as mucus and thick in nature.  Started out as bright red blood.  She was seen as follow up 09/09/2023 per Dr. Marina Goodell. Colonoscopy was recommended. Was done on 09/19/2023. Colonoscopy yielded ulcerated, non obstructing, large mass  in distal rectum. Measures between 4 and 5 cm. It is located 2 to 3 cm from the anal verge. She then had CT chest, abdomen, and pelvis with contrast.  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  No distant metastases were noted.  She does have chronic left-sided hydronephrosis with overlying cortical thinning and decreased/absent left renal function.  This is chronic.  The patient does see urology on routine basis. She denies chest pain, chest pressure, or shortness of breath. She denies headaches or visual disturbances. She denies abdominal pain, nausea, vomiting, or changes in bowel or bladder habits.    MEDICAL HISTORY:  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   Depression    Dysthymia    FHx: cardiovascular disease    History of blood transfusion as child   History of kidney stones    Hyperlipidemia    Hypertension    LBBB (left bundle branch block) 2014   on ekg   Obesity    Pneumonia    hx of as a child   Vitamin D deficiency     SURGICAL HISTORY: Past Surgical History:  Procedure Laterality Date   ABDOMINAL  HYSTERECTOMY  1986   partial   APPENDECTOMY  1986   with hysterectomy   CATARACT EXTRACTION Bilateral 2016   CYSTOSCOPY/URETEROSCOPY/HOLMIUM LASER/STENT PLACEMENT Left 02/09/2021   Procedure: LEFT URETEROSCOPY/ LASER LITHOTRIPSY  AND  STENT PLACEMENT,ENDOPYLOTOMY;  Surgeon: Crist Fat, MD;  Location: [facility];  Service: Urology;  Laterality: Left;   CYSTOSCOPY/URETEROSCOPY/HOLMIUM LASER/STENT PLACEMENT Left 03/15/2021   Procedure: CYSTOSCOPY, LEFT URETEROSCOPY, HOLMIUM LASER ENDOPYELOTOMY, URETERAL STENT PLACEMENT;  Surgeon: Crist Fat, MD;  Location: [facility];  Service: Urology;  Laterality: Left;   EYE SURGERY  2016   bil cataracts   HOLMIUM LASER APPLICATION Left 02/09/2021   Procedure: HOLMIUM LASER APPLICATION;  Surgeon: Crist Fat, MD;  Location: [facility];  Service: Urology;  Laterality: Left;   KNEE ARTHROSCOPY  1995   left   NEPHROLITHOTOMY Left 03/23/2020   Procedure: LEFT NEPHROLITHOTOMY PERCUTANEOUS WITH SURGEON ACCESS;  Surgeon: Crist Fat, MD;  Location: [facility];  Service: Urology;  Laterality: Left;   TONSILLECTOMY  1945   adenoids removed   TOTAL HIP ARTHROPLASTY Left 06/03/2023   Procedure: TOTAL HIP ARTHROPLASTY ANTERIOR APPROACH;  Surgeon: Durene Romans, MD;  Location: [facility];  Service: Orthopedics;  Laterality: Left;   TOTAL KNEE ARTHROPLASTY Right 06/06/2020   Procedure: TOTAL KNEE ARTHROPLASTY;  Surgeon: Durene Romans, MD;  Location: [facility];  Service: Orthopedics;  Laterality: Right;  70 mins   TOTAL KNEE ARTHROPLASTY Left 07/17/2021   Procedure: TOTAL KNEE ARTHROPLASTY;  Surgeon: Durene Romans, MD;  Location: [facility];  Service: Orthopedics;  Laterality: Left;   TUBAL LIGATION  1979    SOCIAL HISTORY: Social History   Socioeconomic History   Marital status: Divorced    Spouse name: Not on file   Number of children: 2   Years of education: Not on file   Highest education level: Not on file   Occupational History   Not on file  Tobacco Use   Smoking status: Former    Current packs/day: 0.00    Average packs/day: 0.5 packs/day for 10.0 years (5.0 ttl pk-yrs)    Types: Cigarettes    Start date: 07/17/1957    Quit date: 07/18/1967    Years since quitting: 56.2   Smokeless tobacco: Never  Vaping Use   Vaping status: Never Used  Substance and Sexual Activity   Alcohol use: Yes    Alcohol/week: 1.0 standard drink of alcohol    Types: 1 Glasses of wine per week    Comment: 1-2 glasses  weekly   Drug use: No   Sexual activity: Not Currently  Other Topics Concern   Not on file  Social History Narrative   Not on file   Social Determinants of Health   Financial Resource Strain: Not on file  Food Insecurity: No Food Insecurity (06/03/2023)   Hunger Vital Sign    Worried About Running Out of Food in the Last Year: Never true    Ran Out of Food in the Last Year: Never true  Transportation Needs: No Transportation Needs (06/03/2023)   PRAPARE - Administrator, Civil Service (Medical): No    Lack of Transportation (Non-Medical): No  Physical Activity: Not on file  Stress: Not on file  Social Connections: Not on file  Intimate Partner Violence: Not At Risk (06/03/2023)   Humiliation, Afraid, Rape, and Kick questionnaire    Fear of Current or Ex-Partner: No    Emotionally Abused: No    Physically Abused: No    Sexually Abused: No    FAMILY HISTORY: Family History  Problem Relation Age of Onset   Breast cancer Mother    Heart disease Father    Colon cancer Neg Hx    Stomach cancer Neg Hx    Rectal cancer Neg Hx     ALLERGIES:  is allergic to statins and sulfa antibiotics.  MEDICATIONS:  Current Outpatient Medications  Medication Sig Dispense Refill   albuterol (PROVENTIL HFA;VENTOLIN HFA) 108 (90 BASE) MCG/ACT inhaler Inhale 2 puffs into the lungs every 6 (six) hours as needed for wheezing or shortness of breath. 1 Inhaler 0   bacitracin-polymyxin b  (POLYSPORIN) ointment Apply 1 application topically daily as needed (wound care).     bisoprolol-hydrochlorothiazide (ZIAC) 5-6.25 MG tablet TAKE 1 TABLET BY MOUTH DAILY. (Patient taking differently: Take 1 tablet by mouth at bedtime.) 90 tablet 0   diclofenac Sodium (VOLTAREN) 1 % GEL Apply 1 Application topically 2 (two) times daily.     pravastatin (PRAVACHOL) 40 MG tablet TAKE 1 TABLET EVERY DAY (Patient taking differently: Take 40 mg by mouth every evening.) 90 tablet 0   Propylene Glycol, PF, (SYSTANE COMPLETE PF) 0.6 % SOLN Place 1 drop into both eyes daily.     sertraline (ZOLOFT) 100 MG tablet TAKE 1 TABLET EVERY DAY (Patient taking differently: Take 150 mg by mouth at bedtime.) 90 tablet 0  traZODone (DESYREL) 50 MG tablet TAKE ONE TABLET BY MOUTH ONCE a DAY at bedtime AS NEEDED     Wheat Dextrin (BENEFIBER) POWD Take 1 Dose by mouth daily. 1 dose = 1 tablespoon     No current facility-administered medications for this visit.    REVIEW OF SYSTEMS:   Constitutional: Denies fevers, chills or abnormal night sweats Eyes: Denies blurriness of vision, double vision or watery eyes Ears, nose, mouth, throat, and face: Denies mucositis or sore throat Respiratory: Denies cough, dyspnea or wheezes Cardiovascular: Denies palpitation, chest discomfort or lower extremity swelling Gastrointestinal:  Denies nausea, heartburn or change in bowel habits.  States that she does have diarrhea frequently.  This is associated with eating.  This is unchanged from baseline.  Has noticed blood in her stool and bleeding from rectum. Skin: Denies abnormal skin rashes Lymphatics: Denies new lymphadenopathy or easy bruising Neurological:Denies numbness, tingling or new weaknesses Behavioral/Psych: Mood is stable, no new changes  All other systems were reviewed with the patient and are negative.  PHYSICAL EXAMINATION: ECOG PERFORMANCE STATUS: 1 - Symptomatic but completely ambulatory  Vitals:   10/02/23 1411   BP: (!) 152/50  Pulse: 61  Resp: 17  Temp: 97.9 F (36.6 C)  SpO2: 96%   Filed Weights   10/02/23 1411  Weight: 182 lb 1.6 oz (82.6 kg)    GENERAL:alert, no distress and comfortable SKIN: skin color, texture, turgor are normal, no rashes or significant lesions EYES: normal, conjunctiva are pink and non-injected, sclera clear OROPHARYNX:no exudate, no erythema and lips, buccal mucosa, and tongue normal  NECK: supple, thyroid normal size, non-tender, without nodularity LYMPH:  no palpable lymphadenopathy in the cervical, axillary or inguinal LUNGS: clear to auscultation and percussion with normal breathing effort HEART: regular rate & rhythm and no murmurs and no lower extremity edema ABDOMEN:abdomen soft, non-tender and normal bowel sounds Musculoskeletal:no cyanosis of digits and no clubbing.  Using a walker for mobility assistance. PSYCH: alert & oriented x 3 with fluent speech NEURO: no focal motor/sensory deficits  LABORATORY DATA:  I have reviewed the data as listed Lab Results  Component Value Date   WBC 6.7 10/02/2023   HGB 13.1 10/02/2023   HCT 39.3 10/02/2023   MCV 89.9 10/02/2023   PLT 228 10/02/2023   Recent Labs    05/21/23 1124 06/04/23 0340 09/23/23 1344  NA 141 137 144  K 3.5 3.8 3.3*  CL 106 105 107  CO2 25 24 27   GLUCOSE 103* 170* 95  BUN 20 15 11   CREATININE 1.00 1.14* 0.92  CALCIUM 9.6 8.4* 9.3  GFRNONAA 56* 48*  --     RADIOGRAPHIC STUDIES: CT CHEST ABDOMEN PELVIS W CONTRAST  Result Date: 10/02/2023 CLINICAL DATA:  Rectal cancer. Rule out metastatic disease. * Tracking Code: BO * EXAM: CT CHEST, ABDOMEN, AND PELVIS WITH CONTRAST TECHNIQUE: Multidetector CT imaging of the chest, abdomen and pelvis was performed following the standard protocol during bolus administration of intravenous contrast. RADIATION DOSE REDUCTION: This exam was performed according to the departmental dose-optimization program which includes automated exposure control,  adjustment of the mA and/or kV according to patient size and/or use of iterative reconstruction technique. CONTRAST:  OMNIPAQUE IOHEXOL 300 MG/ML  SOLN COMPARISON:  02/26/2021 abdominopelvic CT from [facility]-report only. No prior chest CT. FINDINGS: CT CHEST FINDINGS Cardiovascular: Aortic atherosclerosis. Tortuous descending thoracic aorta. Normal heart size, without pericardial effusion. Multivessel coronary artery atherosclerosis. No central pulmonary embolism, on this non-dedicated study. Mediastinum/Nodes: No supraclavicular adenopathy.  No mediastinal or hilar adenopathy. Lungs/Pleura: No pleural fluid. Biapical pleuroparenchymal scarring. Lingular and left lower lobe scarring. Musculoskeletal: Included within the abdomen pelvic section. CT ABDOMEN PELVIS FINDINGS Hepatobiliary: Normal liver. 3 cm gallstone without acute cholecystitis or biliary duct dilatation. Pancreas: Fatty replacement throughout the pancreas. No duct dilatation or acute inflammation. Spleen: Normal in size, without focal abnormality. Adrenals/Urinary Tract: Normal adrenal glands. Punctate, left-greater-than-right renal collecting system calculi. Punctate stone in the left renal pelvis. There is marked left renal cortical thinning and presumably chronic left sided hydronephrosis. No hydroureter. Lack of significant contrast excretion from the left kidney on delayed images. 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 Grossly normal urinary bladder. Stomach/Bowel: Normal stomach, without wall thickening. The rectal primary is identified as an area of asymmetric left-sided wall thickening within the mid rectum including on 101/2. No upstream obstruction. Scattered colonic diverticula. Normal terminal ileum and appendix.  Normal small bowel. Vascular/Lymphatic: Celiac ectasia. Aortic atherosclerosis. No abdominal adenopathy. No pelvic sidewall adenopathy. 8 mm node within the  sigmoid mesocolon on 93/2. 6 mm node in the superior mesorectum on 96/2. Reproductive: Hysterectomy.  No adnexal mass. Other: No significant free fluid. Moderate pelvic floor laxity. No abdominal ascites. No evidence of omental or peritoneal disease. Musculoskeletal: Left hip arthroplasty. Thoracolumbar spondylosis with moderate S shaped curvature. IMPRESSION: 1. Rectal primary with nodal metastasis in the mesorectum and sigmoid mesocolon. 2. No distant metastasis. 3. Chronic left-sided hydronephrosis with overlying cortical thinning and decreased/absent left renal function. Favor chronic ureteropelvic junction obstruction, given absence of hydroureter. 4. 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 5. Incidental findings, including: Coronary artery atherosclerosis. Aortic Atherosclerosis (ICD10-I70.0). Cholelithiasis. Bilateral nephrolithiasis. Electronically Signed   By: Jeronimo Greaves M.D.   On: 10/02/2023 13:50    ASSESSMENT & PLAN:  Adenocarcinoma of rectum [facility]) Assessment & Plan: Reviewed results of colonoscopy and CT of the chest, abdomen, and pelvis.  Confirmed suspected diagnosis of primary rectal adenocarcinoma.   -Pelvic MRI for rectal cancer staging.   -Contact pathology to order MMR from rectal tumor.   -Check CBC, CMP, and CEA.   -Refer for consultation with radiology oncology and consultation with GI oncology surgeon.  Orders: -     MR PELVIS WO CM RECTAL CA STAGING; Future -     Ambulatory referral to Radiation Oncology -     Ambulatory referral to General Surgery  Treatment options were discussed with the patient. She understands that treatment may consist of chemotherapy, radiation, and/or surgery. She did voice understanding and is agreeable to proceed with testing and consult with radiology oncology and general surgery.  Follow up with patient after pelvic MRI to discuss treatment plan more definitively.    All questions  were answered. The patient knows to call the clinic with any problems, questions or concerns.      Carlean Jews, NP 10/02/2023 4:21 PM  Addendum I have seen the patient, examined her. I agree with the assessment and and plan and have edited the notes.   82 yo female with PMH of hypertension, arthritis, still lives independently, presented with rectal bleeding.  I reviewed her recent colonoscopy findings, biopsy results, and her staging CT images.  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  Will get pelvic MRI for rectal staging.  Will refer her to radiation oncology and colorectal surgeon.  I discussed the total neoadjuvant therapy, and if she achieves  complete response, we can consider watchful wait approach to avoid surgery.  She is interested in therapy.  She is a 82 year old, probably with tolerated concurrent chemoradiation with Xeloda well, not sure if she will tolerate FOLFOX well, but certainly can try with some dose reduction first.  Will request MMR on her biopsy sample to see if she is a candidate for immunotherapy.  All questions were answered.  Plan to see her back after MRI.  I spent a total of 45 minutes for her visit today, more than 50% time on face-to-face counseling.  Malachy Mood MD 10/02/2023

## 2023-10-02 NOTE — Assessment & Plan Note (Signed)
Reviewed results of colonoscopy and CT of the chest, abdomen, and pelvis.  Confirmed suspected diagnosis of primary rectal adenocarcinoma.   -Pelvic MRI for rectal cancer staging.   -Contact pathology to order MMR from rectal tumor.   -Check CBC, CMP, and CEA.   -Refer for consultation with radiology oncology and consultation with GI oncology surgeon.

## 2023-10-03 LAB — CEA (ACCESS): CEA (CHCC): 1.99 ng/mL (ref 0.00–5.00)

## 2023-10-07 ENCOUNTER — Telehealth: Payer: Self-pay | Admitting: Radiation Oncology

## 2023-10-07 NOTE — Telephone Encounter (Signed)
Called patient to schedule a consultation w. Dr. Mitzi Hansen. Patient requested afternoon appointment.

## 2023-10-09 ENCOUNTER — Telehealth: Payer: Self-pay | Admitting: Hematology

## 2023-10-09 ENCOUNTER — Other Ambulatory Visit: Payer: Self-pay | Admitting: *Deleted

## 2023-10-09 NOTE — Progress Notes (Signed)
The proposed treatment discussed in conference is for discussion purpose only and is not a binding recommendation.  The patients have not been physically examined, or presented with their treatment options.  Therefore, final treatment plans cannot be decided.  

## 2023-10-10 ENCOUNTER — Ambulatory Visit (HOSPITAL_COMMUNITY)
Admission: RE | Admit: 2023-10-10 | Discharge: 2023-10-10 | Disposition: A | Discharge status: Home / Self Care | Payer: PPO | Source: Ambulatory Visit | Attending: Nurse Practitioner

## 2023-10-10 DIAGNOSIS — C20 Malignant neoplasm of rectum: Secondary | ICD-10-CM

## 2023-10-12 ENCOUNTER — Encounter (HOSPITAL_COMMUNITY): Payer: Self-pay

## 2023-10-12 ENCOUNTER — Ambulatory Visit (HOSPITAL_COMMUNITY): Admission: RE | Admit: 2023-10-12 | Payer: PPO | Source: Ambulatory Visit

## 2023-10-14 ENCOUNTER — Telehealth: Payer: Self-pay

## 2023-10-14 NOTE — Telephone Encounter (Signed)
Spoke with pt's daughter-in-law regarding getting pt rescheduled for her MRI.  Pt's daughter-in-law stated the pt was unaware or forgot she needed to do an enema prior to the procedure; therefore, the procedure was cancelled.  Provided pt's daughter-in-law with the telephone number to [facility] so the pt could get rescheduled for her MRI.  Stated pt would need to follow the directions given by the scheduler at time of making the appointment so the pt's MRI does not get cancelled.  Pt's daughter-in-law stated they will call [facility] today and get the appt rescheduled.

## 2023-10-15 ENCOUNTER — Other Ambulatory Visit: Payer: Self-pay

## 2023-10-15 ENCOUNTER — Telehealth: Payer: Self-pay | Admitting: Hematology

## 2023-10-15 DIAGNOSIS — C20 Malignant neoplasm of rectum: Secondary | ICD-10-CM

## 2023-10-15 NOTE — Progress Notes (Signed)
GI Location of Tumor / Histology: Rectal-Distal  Samantha Jenkins presented with complaints of rectal bleeding in April 2024 that gradually became worse.  Colonoscopy was obtained that showed an ulcerated, non obstructing, large mass in the distal rectum.  CT imaging noted nodal metastasis and mesorectum and sigmoid mesocolon metastasis.  MRI Pelvis 10/24/2023  Biopsies of Rectal Mass 09/19/2023   Past/Anticipated interventions by surgeon, if any:   Past/Anticipated interventions by medical oncology, if any:  Vincent Gros NP / [name] 10/02/2023 -Treatment options were discussed with the patient. She understands that treatment may consist of chemotherapy, radiation, and/or surgery.  -Follow up with patient after pelvic MRI to discuss treatment plan more definitively.    Weight changes, if any:   Bowel/Bladder complaints, if any: She reports she has only one kidney.  Nausea / Vomiting, if any: No  Pain issues, if any:  No  Any blood per rectum: She reports bright red blood and gelatinous discharge last week.  She has not noted any blood so far this week.  SAFETY ISSUES: Prior radiation? No Pacemaker/ICD? No Possible current pregnancy? Hysterectomy Is the patient on methotrexate? No  Current Complaints/Details:

## 2023-10-17 ENCOUNTER — Inpatient Hospital Stay: Payer: PPO

## 2023-10-17 ENCOUNTER — Inpatient Hospital Stay: Payer: PPO | Admitting: Hematology

## 2023-10-20 NOTE — Progress Notes (Signed)
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   HISTORY OF PRESENT ILLNESS: Samantha Jenkins is a 82 y.o. female seen at the request of [name] for a newly diagnosed rectal cancer. The patient originally presented with symptoms of rectal bleeding in the spring 2024 that progressed and ultimately she was referred for colonoscopy which was performed on 09/19/2023 by Dr. Marina Goodell and showed an ulcerated nonobstructing large mass in the distal rectum.  A 3 mm polyp in the ascending colon was also noted.  Final pathology showed a tubular adenoma in the ascending colon the rectal biopsy showed invasive moderately differentiated adenocarcinoma.  She underwent CT chest abdomen pelvis on 09/24/2023 that showed thickening along the left wall of the rectum with mesorectal adenopathy measuring 6 mm and 8 mm.  She has undergone previous hysterectomy.  No other findings suspicious for metastatic disease or repeated, and she is scheduled to undergo MRI of the pelvis on 10/24/2023.  She is seen today to discuss treatment recommendations.  Her case was in multidisciplinary GI oncology conference and initially our discussion was that if she did not have evidence of metastatic disease, proceeding with chemoradiation followed by reevaluation with Dr. Maisie Fus to determine if surgery was next versus proceeding with addition of total neoadjuvant chemotherapy.    PREVIOUS RADIATION THERAPY: {EXAM; YES/NO:19492::"No"}   PAST MEDICAL HISTORY:  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   Depression    Dysthymia    FHx: cardiovascular disease    History  of blood transfusion as child   History of kidney stones    Hyperlipidemia    Hypertension    LBBB (left bundle branch block) 2014   on ekg   Obesity    Pneumonia    hx of as a child   Vitamin D deficiency        PAST SURGICAL HISTORY: Past Surgical History:  Procedure Laterality Date   ABDOMINAL HYSTERECTOMY  1986   partial   APPENDECTOMY  1986   with hysterectomy   CATARACT EXTRACTION Bilateral 2016   CYSTOSCOPY/URETEROSCOPY/HOLMIUM LASER/STENT PLACEMENT Left 02/09/2021   Procedure: LEFT URETEROSCOPY/ LASER LITHOTRIPSY  AND  STENT PLACEMENT,ENDOPYLOTOMY;  Surgeon: Crist Fat, MD;  Location: [facility];  Service: Urology;  Laterality: Left;   CYSTOSCOPY/URETEROSCOPY/HOLMIUM LASER/STENT PLACEMENT Left 03/15/2021   Procedure: CYSTOSCOPY, LEFT URETEROSCOPY, HOLMIUM LASER ENDOPYELOTOMY, URETERAL STENT PLACEMENT;  Surgeon: Crist Fat, MD;  Location: [facility];  Service: Urology;  Laterality: Left;   EYE SURGERY  2016   bil cataracts   HOLMIUM LASER APPLICATION Left 02/09/2021   Procedure: HOLMIUM LASER APPLICATION;  Surgeon: Crist Fat, MD;  Location: [facility];  Service: Urology;  Laterality: Left;   KNEE ARTHROSCOPY  1995   left   NEPHROLITHOTOMY Left 03/23/2020   Procedure: LEFT NEPHROLITHOTOMY PERCUTANEOUS WITH SURGEON ACCESS;  Surgeon: Crist Fat, MD;  Location: [facility];  Service: Urology;  Laterality: Left;   TONSILLECTOMY  1945   adenoids removed  TOTAL HIP ARTHROPLASTY Left 06/03/2023   Procedure: TOTAL HIP ARTHROPLASTY ANTERIOR APPROACH;  Surgeon: Durene Romans, MD;  Location: [facility];  Service: Orthopedics;  Laterality: Left;   TOTAL KNEE ARTHROPLASTY Right 06/06/2020   Procedure: TOTAL KNEE ARTHROPLASTY;  Surgeon: Durene Romans, MD;  Location: [facility];  Service: Orthopedics;  Laterality: Right;  70 mins   TOTAL KNEE ARTHROPLASTY Left 07/17/2021   Procedure: TOTAL KNEE ARTHROPLASTY;  Surgeon: Durene Romans, MD;   Location: [facility];  Service: Orthopedics;  Laterality: Left;   TUBAL LIGATION  1979     FAMILY HISTORY:  Family History  Problem Relation Age of Onset   Breast cancer Mother    Heart disease Father    Colon cancer Neg Hx    Stomach cancer Neg Hx    Rectal cancer Neg Hx      SOCIAL HISTORY:  reports that she quit smoking about 56 years ago. Her smoking use included cigarettes. She started smoking about 66 years ago. She has a 5 pack-year smoking history. She has never used smokeless tobacco. She reports current alcohol use of about 1.0 standard drink of alcohol per week. She reports that she does not use drugs. The patient is divorced and lives in [place]. She ***   ALLERGIES: Statins and Sulfa antibiotics   MEDICATIONS:  Current Outpatient Medications  Medication Sig Dispense Refill   albuterol (PROVENTIL HFA;VENTOLIN HFA) 108 (90 BASE) MCG/ACT inhaler Inhale 2 puffs into the lungs every 6 (six) hours as needed for wheezing or shortness of breath. 1 Inhaler 0   bacitracin-polymyxin b (POLYSPORIN) ointment Apply 1 application topically daily as needed (wound care).     bisoprolol-hydrochlorothiazide (ZIAC) 5-6.25 MG tablet TAKE 1 TABLET BY MOUTH DAILY. (Patient taking differently: Take 1 tablet by mouth at bedtime.) 90 tablet 0   diclofenac Sodium (VOLTAREN) 1 % GEL Apply 1 Application topically 2 (two) times daily.     pravastatin (PRAVACHOL) 40 MG tablet TAKE 1 TABLET EVERY DAY (Patient taking differently: Take 40 mg by mouth every evening.) 90 tablet 0   Propylene Glycol, PF, (SYSTANE COMPLETE PF) 0.6 % SOLN Place 1 drop into both eyes daily.     sertraline (ZOLOFT) 100 MG tablet TAKE 1 TABLET EVERY DAY (Patient taking differently: Take 150 mg by mouth at bedtime.) 90 tablet 0   traZODone (DESYREL) 50 MG tablet TAKE ONE TABLET BY MOUTH ONCE a DAY at bedtime AS NEEDED     Wheat Dextrin (BENEFIBER) POWD Take 1 Dose by mouth daily. 1 dose = 1 tablespoon     No current  facility-administered medications for this visit.     REVIEW OF SYSTEMS: On review of systems, the patient reports that ***      PHYSICAL EXAM:  Wt Readings from Last 3 Encounters:  10/02/23 182 lb 1.6 oz (82.6 kg)  09/19/23 181 lb (82.1 kg)  09/09/23 181 lb (82.1 kg)   Temp Readings from Last 3 Encounters:  10/02/23 97.9 F (36.6 C) (Oral)  09/19/23 (!) 97.3 F (36.3 C) (Skin)  06/04/23 98 F (36.7 C) (Oral)   BP Readings from Last 3 Encounters:  10/02/23 (!) 152/50  09/19/23 100/64  09/09/23 (!) 150/78   Pulse Readings from Last 3 Encounters:  10/02/23 61  09/19/23 91  09/09/23 (!) 54    /10  In general this is a well appearing caucasian female in no acute distress. She's alert and oriented x4 and appropriate throughout the examination. Cardiopulmonary assessment is negative for acute distress and  she exhibits normal effort.     ECOG = ***  0 - Asymptomatic (Fully active, able to carry on all predisease activities without restriction)  1 - Symptomatic but completely ambulatory (Restricted in physically strenuous activity but ambulatory and able to carry out work of a light or sedentary nature. For example, light housework, office work)  2 - Symptomatic, <50% in bed during the day (Ambulatory and capable of all self care but unable to carry out any work activities. Up and about more than 50% of waking hours)  3 - Symptomatic, >50% in bed, but not bedbound (Capable of only limited self-care, confined to bed or chair 50% or more of waking hours)  4 - Bedbound (Completely disabled. Cannot carry on any self-care. Totally confined to bed or chair)  5 - Death   Santiago Glad MM, Creech RH, Tormey DC, et al. [phone]). "Toxicity and response criteria of the [facility]". Am. Evlyn Clines. Oncol. 5 (6): 649-55    LABORATORY DATA:  Lab Results  Component Value Date   WBC 6.7 10/02/2023   HGB 13.1 10/02/2023   HCT 39.3 10/02/2023   MCV 89.9 10/02/2023   PLT  228 10/02/2023   Lab Results  Component Value Date   NA 144 10/02/2023   K 3.9 10/02/2023   CL 108 10/02/2023   CO2 29 10/02/2023   Lab Results  Component Value Date   ALT 8 10/02/2023   AST 19 10/02/2023   ALKPHOS 122 10/02/2023   BILITOT 0.7 10/02/2023      RADIOGRAPHY: CT CHEST ABDOMEN PELVIS W CONTRAST  Result Date: 10/02/2023 CLINICAL DATA:  Rectal cancer. Rule out metastatic disease. * Tracking Code: BO * EXAM: CT CHEST, ABDOMEN, AND PELVIS WITH CONTRAST TECHNIQUE: Multidetector CT imaging of the chest, abdomen and pelvis was performed following the standard protocol during bolus administration of intravenous contrast. RADIATION DOSE REDUCTION: This exam was performed according to the departmental dose-optimization program which includes automated exposure control, adjustment of the mA and/or kV according to patient size and/or use of iterative reconstruction technique. CONTRAST:  OMNIPAQUE IOHEXOL 300 MG/ML  SOLN COMPARISON:  02/26/2021 abdominopelvic CT from [facility]-report only. No prior chest CT. FINDINGS: CT CHEST FINDINGS Cardiovascular: Aortic atherosclerosis. Tortuous descending thoracic aorta. Normal heart size, without pericardial effusion. Multivessel coronary artery atherosclerosis. No central pulmonary embolism, on this non-dedicated study. Mediastinum/Nodes: No supraclavicular adenopathy. No mediastinal or hilar adenopathy. Lungs/Pleura: No pleural fluid. Biapical pleuroparenchymal scarring. Lingular and left lower lobe scarring. Musculoskeletal: Included within the abdomen pelvic section. CT ABDOMEN PELVIS FINDINGS Hepatobiliary: Normal liver. 3 cm gallstone without acute cholecystitis or biliary duct dilatation. Pancreas: Fatty replacement throughout the pancreas. No duct dilatation or acute inflammation. Spleen: Normal in size, without focal abnormality. Adrenals/Urinary Tract: Normal adrenal glands. Punctate, left-greater-than-right renal collecting system  calculi. Punctate stone in the left renal pelvis. There is marked left renal cortical thinning and presumably chronic left sided hydronephrosis. No hydroureter. Lack of significant contrast excretion from the left kidney on delayed images. 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 Grossly normal urinary bladder. Stomach/Bowel: Normal stomach, without wall thickening. The rectal primary is identified as an area of asymmetric left-sided wall thickening within the mid rectum including on 101/2. No upstream obstruction. Scattered colonic diverticula. Normal terminal ileum and appendix.  Normal small bowel. Vascular/Lymphatic: Celiac ectasia. Aortic atherosclerosis. No abdominal adenopathy. No pelvic sidewall adenopathy. 8 mm node within the sigmoid mesocolon on 93/2. 6 mm node  in the superior mesorectum on 96/2. Reproductive: Hysterectomy.  No adnexal mass. Other: No significant free fluid. Moderate pelvic floor laxity. No abdominal ascites. No evidence of omental or peritoneal disease. Musculoskeletal: Left hip arthroplasty. Thoracolumbar spondylosis with moderate S shaped curvature. IMPRESSION: 1. Rectal primary with nodal metastasis in the mesorectum and sigmoid mesocolon. 2. No distant metastasis. 3. Chronic left-sided hydronephrosis with overlying cortical thinning and decreased/absent left renal function. Favor chronic ureteropelvic junction obstruction, given absence of hydroureter. 4. 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 5. Incidental findings, including: Coronary artery atherosclerosis. Aortic Atherosclerosis (ICD10-I70.0). Cholelithiasis. Bilateral nephrolithiasis. Electronically Signed   By: Jeronimo Greaves M.D.   On: 10/02/2023 13:50       IMPRESSION/PLAN: 1. At least locally advanced adenocarcinoma of the rectum. Dr. Mitzi Hansen discusses the pathology findings and reviews the nature of  locally advanced disease of the rectum and the role of chemoradiation.  Dr. Mitzi Hansen recommends completing the MRI pelvis for extent of disease and for treatment planning.  We discussed the risks, benefits, short, and long term effects of radiotherapy, as well as the curative intent, and the patient is interested in proceeding. Dr. Mitzi Hansen discusses the delivery and logistics of radiotherapy and anticipates a course of 5 1/2 weeks of radiotherapy. Written consent is obtained and placed in the chart, a copy was provided to the patient. She will simulate *** 2. Risks of pelvic floor dysfunction from radiotherapy. We discussed the importance of evaluation with physical therapy prior to pelvic radiation. A referral was placed to physical therapy today.     In a visit lasting *** minutes, greater than 50% of the time was spent face to face discussing the patient's condition, in preparation for the discussion, and coordinating the patient's care.   The above documentation reflects my direct findings during this shared patient visit. Please see the separate note by Dr. Mitzi Hansen on this date for the remainder of the patient's plan of care.    Osker Mason, [facility]   **Disclaimer: This note was dictated with voice recognition software. Similar sounding words can inadvertently be transcribed and this note may contain transcription errors which may not have been corrected upon publication of note.**

## 2023-10-21 ENCOUNTER — Encounter: Payer: Self-pay | Admitting: Radiation Oncology

## 2023-10-21 ENCOUNTER — Ambulatory Visit
Admission: RE | Admit: 2023-10-21 | Discharge: 2023-10-21 | Disposition: A | Discharge status: Home / Self Care | Payer: PPO | Source: Ambulatory Visit | Attending: Radiation Oncology | Admitting: Radiation Oncology

## 2023-10-21 VITALS — BP 165/66 | HR 60 | Temp 97.6°F | Resp 20 | Ht 65.0 in | Wt 177.8 lb

## 2023-10-21 DIAGNOSIS — I700 Atherosclerosis of aorta: Secondary | ICD-10-CM | POA: Diagnosis not present

## 2023-10-21 DIAGNOSIS — I10 Essential (primary) hypertension: Secondary | ICD-10-CM | POA: Insufficient documentation

## 2023-10-21 DIAGNOSIS — K5730 Diverticulosis of large intestine without perforation or abscess without bleeding: Secondary | ICD-10-CM | POA: Insufficient documentation

## 2023-10-21 DIAGNOSIS — I2510 Atherosclerotic heart disease of native coronary artery without angina pectoris: Secondary | ICD-10-CM | POA: Diagnosis not present

## 2023-10-21 DIAGNOSIS — Z87442 Personal history of urinary calculi: Secondary | ICD-10-CM | POA: Diagnosis not present

## 2023-10-21 DIAGNOSIS — C20 Malignant neoplasm of rectum: Secondary | ICD-10-CM | POA: Insufficient documentation

## 2023-10-21 DIAGNOSIS — E785 Hyperlipidemia, unspecified: Secondary | ICD-10-CM | POA: Insufficient documentation

## 2023-10-21 DIAGNOSIS — K8020 Calculus of gallbladder without cholecystitis without obstruction: Secondary | ICD-10-CM | POA: Diagnosis not present

## 2023-10-21 DIAGNOSIS — E559 Vitamin D deficiency, unspecified: Secondary | ICD-10-CM | POA: Diagnosis not present

## 2023-10-21 DIAGNOSIS — Z87891 Personal history of nicotine dependence: Secondary | ICD-10-CM | POA: Diagnosis not present

## 2023-10-21 DIAGNOSIS — Z85828 Personal history of other malignant neoplasm of skin: Secondary | ICD-10-CM | POA: Insufficient documentation

## 2023-10-21 DIAGNOSIS — N132 Hydronephrosis with renal and ureteral calculous obstruction: Secondary | ICD-10-CM | POA: Diagnosis not present

## 2023-10-21 DIAGNOSIS — Z791 Long term (current) use of non-steroidal anti-inflammatories (NSAID): Secondary | ICD-10-CM | POA: Insufficient documentation

## 2023-10-21 DIAGNOSIS — Z79899 Other long term (current) drug therapy: Secondary | ICD-10-CM | POA: Insufficient documentation

## 2023-10-21 DIAGNOSIS — I447 Left bundle-branch block, unspecified: Secondary | ICD-10-CM | POA: Diagnosis not present

## 2023-10-22 ENCOUNTER — Other Ambulatory Visit: Payer: Self-pay

## 2023-10-22 ENCOUNTER — Telehealth: Payer: Self-pay

## 2023-10-22 NOTE — Telephone Encounter (Signed)
Pt returned call and I informed her of below.

## 2023-10-22 NOTE — Telephone Encounter (Signed)
This is not our patient.

## 2023-10-22 NOTE — Telephone Encounter (Signed)
LVM for pt to let her know that she called the wrong office and that she should call the cancer center if she is needing to talk to heather.

## 2023-10-22 NOTE — Telephone Encounter (Signed)
Copied from CRM [id number]. Topic: Clinical - Lab/Test Results >> Oct 22, 2023  2:20 PM Mosetta Putt H wrote: Reason for CRM:  needs to speak with heather about mri

## 2023-10-24 ENCOUNTER — Ambulatory Visit (HOSPITAL_COMMUNITY)
Admission: RE | Admit: 2023-10-24 | Discharge: 2023-10-24 | Disposition: A | Discharge status: Home / Self Care | Payer: PPO | Source: Ambulatory Visit | Attending: Nurse Practitioner | Admitting: Nurse Practitioner

## 2023-10-24 DIAGNOSIS — C20 Malignant neoplasm of rectum: Secondary | ICD-10-CM | POA: Insufficient documentation

## 2023-10-27 ENCOUNTER — Ambulatory Visit
Admission: RE | Admit: 2023-10-27 | Discharge: 2023-10-27 | Disposition: A | Discharge status: Home / Self Care | Payer: PPO | Source: Ambulatory Visit | Attending: Radiation Oncology | Admitting: Radiation Oncology

## 2023-10-27 DIAGNOSIS — C20 Malignant neoplasm of rectum: Secondary | ICD-10-CM | POA: Diagnosis present

## 2023-10-27 DIAGNOSIS — Z510 Encounter for antineoplastic radiation therapy: Secondary | ICD-10-CM | POA: Insufficient documentation

## 2023-10-27 DIAGNOSIS — R159 Full incontinence of feces: Secondary | ICD-10-CM | POA: Diagnosis not present

## 2023-10-27 DIAGNOSIS — C772 Secondary and unspecified malignant neoplasm of intra-abdominal lymph nodes: Secondary | ICD-10-CM | POA: Insufficient documentation

## 2023-10-27 DIAGNOSIS — R32 Unspecified urinary incontinence: Secondary | ICD-10-CM | POA: Diagnosis not present

## 2023-10-29 ENCOUNTER — Telehealth: Payer: Self-pay | Admitting: Pharmacist

## 2023-10-29 ENCOUNTER — Other Ambulatory Visit (HOSPITAL_COMMUNITY): Payer: Self-pay

## 2023-10-29 ENCOUNTER — Other Ambulatory Visit: Payer: Self-pay

## 2023-10-29 ENCOUNTER — Other Ambulatory Visit: Payer: Self-pay | Admitting: Pharmacy Technician

## 2023-10-29 ENCOUNTER — Telehealth: Payer: Self-pay | Admitting: Pharmacy Technician

## 2023-10-29 ENCOUNTER — Inpatient Hospital Stay (HOSPITAL_BASED_OUTPATIENT_CLINIC_OR_DEPARTMENT_OTHER): Payer: PPO | Admitting: Hematology

## 2023-10-29 ENCOUNTER — Encounter: Payer: Self-pay | Admitting: Hematology

## 2023-10-29 ENCOUNTER — Inpatient Hospital Stay: Payer: PPO

## 2023-10-29 VITALS — BP 151/54 | HR 90 | Temp 97.4°F | Resp 16 | Wt 174.2 lb

## 2023-10-29 DIAGNOSIS — C20 Malignant neoplasm of rectum: Secondary | ICD-10-CM | POA: Insufficient documentation

## 2023-10-29 DIAGNOSIS — Z510 Encounter for antineoplastic radiation therapy: Secondary | ICD-10-CM | POA: Diagnosis not present

## 2023-10-29 DIAGNOSIS — R159 Full incontinence of feces: Secondary | ICD-10-CM | POA: Insufficient documentation

## 2023-10-29 DIAGNOSIS — C772 Secondary and unspecified malignant neoplasm of intra-abdominal lymph nodes: Secondary | ICD-10-CM | POA: Insufficient documentation

## 2023-10-29 DIAGNOSIS — R32 Unspecified urinary incontinence: Secondary | ICD-10-CM | POA: Insufficient documentation

## 2023-10-29 LAB — CMP (CANCER CENTER ONLY)
ALT: 8 U/L (ref 0–44)
AST: 19 U/L (ref 15–41)
Albumin: 3.9 g/dL (ref 3.5–5.0)
Alkaline Phosphatase: 94 U/L (ref 38–126)
Anion gap: 8 (ref 5–15)
BUN: 13 mg/dL (ref 8–23)
CO2: 26 mmol/L (ref 22–32)
Calcium: 9.5 mg/dL (ref 8.9–10.3)
Chloride: 108 mmol/L (ref 98–111)
Creatinine: 1.05 mg/dL — ABNORMAL HIGH (ref 0.44–1.00)
GFR, Estimated: 53 mL/min — ABNORMAL LOW (ref >60)
Glucose, Bld: 94 mg/dL (ref 70–99)
Potassium: 3.4 mmol/L — ABNORMAL LOW (ref 3.5–5.1)
Sodium: 142 mmol/L (ref 135–145)
Total Bilirubin: 0.7 mg/dL (ref <1.2)
Total Protein: 6.9 g/dL (ref 6.5–8.1)

## 2023-10-29 LAB — CBC WITH DIFFERENTIAL (CANCER CENTER ONLY)
Abs Immature Granulocytes: 0 10*3/uL (ref 0.00–0.07)
Basophils Absolute: 0 10*3/uL (ref 0.0–0.1)
Basophils Relative: 1 %
Eosinophils Absolute: 0.1 10*3/uL (ref 0.0–0.5)
Eosinophils Relative: 2 %
HCT: 36.5 % (ref 36.0–46.0)
Hemoglobin: 12.8 g/dL (ref 12.0–15.0)
Immature Granulocytes: 0 %
Lymphocytes Relative: 27 %
Lymphs Abs: 1.4 10*3/uL (ref 0.7–4.0)
MCH: 31.2 pg (ref 26.0–34.0)
MCHC: 35.1 g/dL (ref 30.0–36.0)
MCV: 89 fL (ref 80.0–100.0)
Monocytes Absolute: 0.4 10*3/uL (ref 0.1–1.0)
Monocytes Relative: 8 %
Neutro Abs: 3.4 10*3/uL (ref 1.7–7.7)
Neutrophils Relative %: 62 %
Platelet Count: 217 10*3/uL (ref 150–400)
RBC: 4.1 MIL/uL (ref 3.87–5.11)
RDW: 14.8 % (ref 11.5–15.5)
WBC Count: 5.4 10*3/uL (ref 4.0–10.5)
nRBC: 0 % (ref 0.0–0.2)

## 2023-10-29 MED ORDER — CAPECITABINE 500 MG PO TABS
825.0000 mg/m2 | ORAL_TABLET | Freq: Two times a day (BID) | ORAL | 1 refills | Status: DC
Start: 2023-10-29 — End: 2023-12-17
  Filled 2023-10-29: qty 90, 19d supply, fill #0
  Filled 2023-11-10: qty 90, 19d supply, fill #1

## 2023-10-29 NOTE — Assessment & Plan Note (Addendum)
-  [id number], stage IIIB, G2 -presented with rectal bleeding.  I reviewed her recent colonoscopy findings, biopsy results, and her staging CT images.  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  -pelvic MRI 10/24/2023 showed  -pt was seen by surgeon Dr. Maisie Fus and rad/onc Dr. Mitzi Hansen -she agrees with TNT, will start concurrent chemoRT with Xeloda first, followed by dose modified FOLFOX

## 2023-10-29 NOTE — Progress Notes (Unsigned)
Specialty Pharmacy Initial Fill Coordination Note  Samantha Jenkins is a 82 y.o. female contacted today regarding refills of specialty medication(s) Capecitabine .  Patient requested Daryll Drown at [facility] [place]  on 10/30/23   Medication will be filled on 10/29/23.   Patient is aware of $18.21 copayment.

## 2023-10-29 NOTE — Telephone Encounter (Signed)
Oral Chemotherapy Pharmacist Encounter  I met with patient and patient's son, Samantha Jenkins, in clinic for overview of: Xeloda (capecitabine) for the treatment of rectal cancer in conjunction with radiation, planned duration 5 1/2 weeks.   Counseled patient on administration, dosing, side effects, monitoring, drug-food interactions, safe handling, storage, and disposal.  CBC w/ Diff and CMP from 10/29/23 assessed, patient with Scr of 1.05 mg/dL (CrCl ~40.9 mL/min) - patient dose OK at this time. Prescription dose and frequency assessed for appropriateness.  Patient will take Xeloda 500mg  tablets, 3 tablets (1500mg ) by mouth in AM and 3 tabs (1500mg ) by mouth in PM, within 30 minutes of finishing meals, on days of radiation only.  Xeloda and radiation start date: 11/03/23  Adverse effects of Xeloda include but are not limited to: fatigue, decreased blood counts, GI upset, diarrhea, mouth sores, and hand-foot syndrome. Hand-foot syndrome: discussed use of cream such as Udderly Smooth Extra Care 20 or equivalent advanced care cream that has 20% urea content for advanced skin hydration while on Xeloda. Also discussed with patient use of Voltaren 1% gel (which patient has already) to hands (1 fingertip length) twice daily while on Xeloda. Diarrhea: Patient will obtain Imodium (loperamide) to have on hand if they experience diarrhea. Patient knows to alert the office of 4 or more loose stools above baseline.  Reviewed with patient importance of keeping a medication schedule and plan for any missed doses. No barriers to medication adherence identified.  Medication reconciliation performed and medication/allergy list updated. Current medication list in Epic reviewed, no relevant/significant DDIs with Xeloda identified.  All questions answered.  Samantha Jenkins voiced understanding and appreciation.   Medication education handout given to patient. Patient knows to call the office with questions or concerns.  Oral Chemotherapy Clinic phone number provided to patient.   Sherry Ruffing, PharmD, BCPS, [facility] Hematology/Oncology Clinical Pharmacist [facility] and [facility] Oral Chemotherapy Navigation Clinics [phone] 10/29/2023 3:36 PM

## 2023-10-29 NOTE — Telephone Encounter (Signed)
Oral Oncology Patient Advocate Encounter  After completing a benefits investigation, prior authorization for capecitabine is not required at this time through [facility].  Patient's copay is $18.21 per #90 tablets.   Jinger Neighbors, CPhT-Adv Oncology Pharmacy Patient Advocate [facility] Direct Number: [phone]  Fax: [phone]

## 2023-10-30 ENCOUNTER — Other Ambulatory Visit: Payer: Self-pay

## 2023-10-30 ENCOUNTER — Other Ambulatory Visit (HOSPITAL_COMMUNITY): Payer: Self-pay

## 2023-10-30 NOTE — Progress Notes (Signed)
Oral Chemotherapy Pharmacist Encounter  Patient was counseled under telephone encounter from 10/29/23.  Sherry Ruffing, PharmD, BCPS, BCOP Hematology/Oncology Clinical Pharmacist [facility] Oral Chemotherapy Navigation Clinics [phone] 10/30/2023 8:41 AM

## 2023-10-31 DIAGNOSIS — Z510 Encounter for antineoplastic radiation therapy: Secondary | ICD-10-CM | POA: Diagnosis not present

## 2023-10-31 NOTE — Progress Notes (Signed)
[facility]   [phone] [phone]   Clinic Follow up Note   Patient Care Team: Irven Coe, MD as PCP - General (Family Medicine) Malachy Mood, MD as Consulting Physician (Hematology and Oncology)  Date of Service:  10/31/2023  CHIEF COMPLAINT: f/u of rectal cancer  CURRENT THERAPY:  Pending concurrent chemoradiation  Oncology History   Adenocarcinoma of rectum (HCC) -cT3bN1M0, stage IIIB, G2 -presented with rectal bleeding.  I reviewed her recent colonoscopy findings, biopsy results, and her staging CT images.  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  -pelvic MRI 10/24/2023 showed  -pt was seen by surgeon Dr. Maisie Fus and rad/onc Dr. Mitzi Hansen -she agrees with TNT, will start concurrent chemoRT with Xeloda first, followed by dose modified FOLFOX  -MMR still pending, I will check with path lab    Assessment and Plan    Rectal Cancer Follow-up for rectal cancer. Pt has chronic bladder and bowel control issues, including urinary and fecal incontinence. Symptoms have persisted for about a year. Scheduled to start chemoradiation therapy soon. Discussed treatment plan, including chemotherapy pills Xeloda as a radiation sensitizer. Informed about the 20-30% chance of cure with combined chemoradiation and IV chemotherapy, potentially avoiding surgery. Discussed side effects: nausea, diarrhea, skin toxicity, and hair thinning. Emphasized adherence to treatment schedule and monitoring for severe side effects. - Start chemoradiation therapy on Monday, November 03, 2023 - Prescribe chemotherapy pills: 3 tablets twice a day on radiation days (Monday through Friday) - Coordinate with oral pharmacist Lurena Joiner for education and medication delivery - Monitor for side effects such as nausea, diarrhea, and skin toxicity - Schedule follow-up appointments every other week starting the second week of treatment - Perform blood tests to  monitor kidney and liver function and blood counts at each follow-up visit - Discuss potential need for IV chemotherapy after a 3-4 week break post-radiation -I recommend pelvic floor exercises to improve bladder and bowel control, pt declined   Fecal Incontinence Reports fecal incontinence with inability to control bowel movements and sudden urge to defecate. Symptoms ongoing since October. Discussed potential benefits of pelvic floor exercises. - Consider pelvic floor exercises to improve bowel control - Monitor for any changes in symptoms during chemoradiation therapy  Urinary Incontinence Reports urinary incontinence with difficulty controlling bladder, often wets Depends before reaching the bathroom. Symptoms ongoing for about a year. Discussed potential benefits of pelvic floor exercises. - Discuss bladder control issues with Dr. Mitzi Hansen on Monday - Consider pelvic floor exercises to improve bladder control  General Health Maintenance Emphasized importance of managing side effects and maintaining overall health during treatment. Informed about potential skin issues and need for moisturizers. - Call if experiencing severe side effects such as dehydration, fever, or inability to eat/drink - Use moisturizers for skin care if experiencing redness or inflammation - Ensure timely medication delivery and adherence to the treatment schedule  Plan -Taking pelvic MRI report was not back when I saw patient.  Will call her with results. -Plan to start concurrent chemoradiation with Xeloda on Monday, November 03, 2023. - Follow-up with Dr. Mitzi Hansen every Friday - Follow-up with the oncologist every other week starting the second week of treatment - Perform blood tests at each follow-up visit to monitor kidney and liver function and blood counts - Call if experiencing severe side effects or complications.  -I will follow-up and MRI results with pathology     SUMMARY OF ONCOLOGIC HISTORY: Oncology  History  Adenocarcinoma of rectum (HCC)  09/24/2023 Imaging   CT chest abdomen and pelvis with contrast  IMPRESSION: 1. Rectal primary with nodal metastasis in the mesorectum and sigmoid mesocolon. 2. No distant metastasis. 3. Chronic left-sided hydronephrosis with overlying cortical thinning and decreased/absent left renal function. Favor chronic ureteropelvic junction obstruction, given absence of hydroureter. 4. 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 5. Incidental findings, including: Coronary artery atherosclerosis. Aortic Atherosclerosis (ICD10-I70.0). Cholelithiasis. Bilateral nephrolithiasis.   10/02/2023 Initial Diagnosis   Adenocarcinoma of rectum (HCC)   10/24/2023 Cancer Staging   Staging form: Colon and Rectum, AJCC 8th Edition - Clinical stage from 10/24/2023: Stage IIIB (cT3, cN1, cM0) - Signed by Malachy Mood, MD on 10/31/2023 Histologic grade (G): G2 Histologic grading system: 4 grade system      Discussed the use of AI scribe software for clinical note transcription with the patient, who gave verbal consent to proceed.  History of Present Illness   The patient, an 82 year old female with a history of rectal cancer, presents for a follow-up visit. She reports ongoing issues with bladder and bowel control, stating that her "bladder and sphincter muscle have gone on vacation." She describes having no control over her bladder, often wetting her adult diaper before she can reach the bathroom. 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  The patient also reports issues with bowel control, stating that she feels the urge to go to the bathroom and then immediately has a bowel movement, with no time to reach the bathroom. This issue has been ongoing since the end of October, around the time of her colonoscopy. She describes her stools as being very diarrheal-like and  sometimes there is no feces but a bloody, gelatinous mass, which she attributes to bleeding from the tumor.  The patient also has a history of depression, for which she is taking Zoloft 150mg . She reports that the medication is helping and she feels "as relatively sane as I've ever been. ".         All other systems were reviewed with the patient and are negative.  MEDICAL HISTORY:  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   Depression    Dysthymia    FHx: cardiovascular disease    History of blood transfusion as child   History of kidney stones    Hyperlipidemia    Hypertension    LBBB (left bundle branch block) 2014   on ekg   Obesity    Pneumonia    hx of as a child   Vitamin D deficiency     SURGICAL HISTORY: Past Surgical History:  Procedure Laterality Date   ABDOMINAL HYSTERECTOMY  1986   partial   APPENDECTOMY  1986   with hysterectomy   CATARACT EXTRACTION Bilateral 2016   CYSTOSCOPY/URETEROSCOPY/HOLMIUM LASER/STENT PLACEMENT Left 02/09/2021   Procedure: LEFT URETEROSCOPY/ LASER LITHOTRIPSY  AND  STENT PLACEMENT,ENDOPYLOTOMY;  Surgeon: Crist Fat, MD;  Location: [facility];  Service: Urology;  Laterality: Left;   CYSTOSCOPY/URETEROSCOPY/HOLMIUM LASER/STENT PLACEMENT Left 03/15/2021   Procedure: CYSTOSCOPY, LEFT URETEROSCOPY, HOLMIUM LASER ENDOPYELOTOMY, URETERAL STENT PLACEMENT;  Surgeon: Crist Fat, MD;  Location: [facility];  Service: Urology;  Laterality: Left;   EYE SURGERY  2016   bil cataracts  HOLMIUM LASER APPLICATION Left 02/09/2021   Procedure: HOLMIUM LASER APPLICATION;  Surgeon: Crist Fat, MD;  Location: [facility];  Service: Urology;  Laterality: Left;   KNEE ARTHROSCOPY  1995   left   NEPHROLITHOTOMY Left 03/23/2020   Procedure: LEFT NEPHROLITHOTOMY PERCUTANEOUS WITH SURGEON ACCESS;   Surgeon: Crist Fat, MD;  Location: [facility];  Service: Urology;  Laterality: Left;   TONSILLECTOMY  1945   adenoids removed   TOTAL HIP ARTHROPLASTY Left 06/03/2023   Procedure: TOTAL HIP ARTHROPLASTY ANTERIOR APPROACH;  Surgeon: Durene Romans, MD;  Location: [facility];  Service: Orthopedics;  Laterality: Left;   TOTAL KNEE ARTHROPLASTY Right 06/06/2020   Procedure: TOTAL KNEE ARTHROPLASTY;  Surgeon: Durene Romans, MD;  Location: [facility];  Service: Orthopedics;  Laterality: Right;  70 mins   TOTAL KNEE ARTHROPLASTY Left 07/17/2021   Procedure: TOTAL KNEE ARTHROPLASTY;  Surgeon: Durene Romans, MD;  Location: [facility];  Service: Orthopedics;  Laterality: Left;   TUBAL LIGATION  1979    I have reviewed the social history and family history with the patient and they are unchanged from previous note.  ALLERGIES:  is allergic to statins and sulfa antibiotics.  MEDICATIONS:  Current Outpatient Medications  Medication Sig Dispense Refill   capecitabine (XELODA) 500 MG tablet Take 3 tablets (1,500 mg total) by mouth 2 (two) times daily after a meal. TAKE 30 min after meal, take on days of radiation, Monday through Friday 90 tablet 1   albuterol (PROVENTIL HFA;VENTOLIN HFA) 108 (90 BASE) MCG/ACT inhaler Inhale 2 puffs into the lungs every 6 (six) hours as needed for wheezing or shortness of breath. 1 Inhaler 0   bacitracin-polymyxin b (POLYSPORIN) ointment Apply 1 application topically daily as needed (wound care).     bisoprolol-hydrochlorothiazide (ZIAC) 5-6.25 MG tablet TAKE 1 TABLET BY MOUTH DAILY. (Patient taking differently: Take 1 tablet by mouth at bedtime.) 90 tablet 0   diclofenac Sodium (VOLTAREN) 1 % GEL Apply 1 Application topically 2 (two) times daily.     pravastatin (PRAVACHOL) 40 MG tablet TAKE 1 TABLET EVERY DAY (Patient taking differently: Take 40 mg by mouth every evening.) 90 tablet 0   Propylene Glycol, PF, (SYSTANE COMPLETE PF) 0.6 % SOLN Place 1 drop into both eyes daily.      sertraline (ZOLOFT) 100 MG tablet TAKE 1 TABLET EVERY DAY (Patient taking differently: Take 150 mg by mouth at bedtime.) 90 tablet 0   traZODone (DESYREL) 50 MG tablet TAKE ONE TABLET BY MOUTH ONCE a DAY at bedtime AS NEEDED     No current facility-administered medications for this visit.    PHYSICAL EXAMINATION: ECOG PERFORMANCE STATUS: 1 - Symptomatic but completely ambulatory  Vitals:   10/29/23 1407  BP: (!) 151/54  Pulse: 90  Resp: 16  Temp: (!) 97.4 F (36.3 C)  SpO2: 98%   Wt Readings from Last 3 Encounters:  10/29/23 174 lb 3.2 oz (79 kg)  10/21/23 177 lb 12.8 oz (80.6 kg)  10/02/23 182 lb 1.6 oz (82.6 kg)     GENERAL:alert, no distress and comfortable SKIN: skin color, texture, turgor are normal, no rashes or significant lesions EYES: normal, Conjunctiva are pink and non-injected, sclera clear NECK: supple, thyroid normal size, non-tender, without nodularity LYMPH:  no palpable lymphadenopathy in the cervical, axillary  LUNGS: clear to auscultation and percussion with normal breathing effort HEART: regular rate & rhythm and no murmurs and no lower extremity edema ABDOMEN:abdomen soft, non-tender and normal bowel sounds Musculoskeletal:no cyanosis  of digits and no clubbing  NEURO: alert & oriented x 3 with fluent speech, no focal motor/sensory deficits  Physical Exam          LABORATORY DATA:  I have reviewed the data as listed    Latest Ref Rng & Units 10/29/2023    1:21 PM 10/02/2023    3:53 PM 06/04/2023    3:40 AM  CBC  WBC 4.0 - 10.5 K/uL 5.4  6.7  10.6   Hemoglobin 12.0 - 15.0 g/dL 40.9  81.1  91.4   Hematocrit 36.0 - 46.0 % 36.5  39.3  33.5   Platelets 150 - 400 K/uL 217  228  167         Latest Ref Rng & Units 10/29/2023    1:21 PM 10/02/2023    3:53 PM 09/23/2023    1:44 PM  CMP  Glucose 70 - 99 mg/dL 94  97  95   BUN 8 - 23 mg/dL 13  12  11    Creatinine 0.44 - 1.00 mg/dL 7.82  9.56  2.13   Sodium 135 - 145 mmol/L 142  144  144    Potassium 3.5 - 5.1 mmol/L 3.4  3.9  3.3   Chloride 98 - 111 mmol/L 108  108  107   CO2 22 - 32 mmol/L 26  29  27    Calcium 8.9 - 10.3 mg/dL 9.5  9.9  9.3   Total Protein 6.5 - 8.1 g/dL 6.9  7.1    Total Bilirubin <1.2 mg/dL 0.7  0.7    Alkaline Phos 38 - 126 U/L 94  122    AST 15 - 41 U/L 19  19    ALT 0 - 44 U/L 8  8        RADIOGRAPHIC STUDIES: I have personally reviewed the radiological images as listed and agreed with the findings in the report. No results found.    No orders of the defined types were placed in this encounter.  All questions were answered. The patient knows to call the clinic with any problems, questions or concerns. No barriers to learning was detected. The total time spent in the appointment was 30 minutes.     Malachy Mood, MD 10/31/2023

## 2023-11-03 ENCOUNTER — Other Ambulatory Visit: Payer: Self-pay

## 2023-11-03 ENCOUNTER — Ambulatory Visit
Admission: RE | Admit: 2023-11-03 | Discharge: 2023-11-03 | Disposition: A | Discharge status: Home / Self Care | Payer: PPO | Source: Ambulatory Visit | Attending: Radiation Oncology | Admitting: Radiation Oncology

## 2023-11-03 DIAGNOSIS — Z510 Encounter for antineoplastic radiation therapy: Secondary | ICD-10-CM | POA: Diagnosis not present

## 2023-11-03 LAB — RAD ONC ARIA SESSION SUMMARY
Course Elapsed Days: 0
Plan Fractions Treated to Date: 1
Plan Prescribed Dose Per Fraction: 1.8 Gy
Plan Total Fractions Prescribed: 25
Plan Total Prescribed Dose: 45 Gy
Reference Point Dosage Given to Date: 1.8 Gy
Reference Point Session Dosage Given: 1.8 Gy
Session Number: 1

## 2023-11-04 ENCOUNTER — Ambulatory Visit
Admission: RE | Admit: 2023-11-04 | Discharge: 2023-11-04 | Disposition: A | Discharge status: Home / Self Care | Payer: PPO | Source: Ambulatory Visit | Attending: Radiation Oncology

## 2023-11-04 ENCOUNTER — Other Ambulatory Visit: Payer: Self-pay

## 2023-11-04 DIAGNOSIS — Z510 Encounter for antineoplastic radiation therapy: Secondary | ICD-10-CM | POA: Diagnosis not present

## 2023-11-04 LAB — RAD ONC ARIA SESSION SUMMARY
Course Elapsed Days: 1
Plan Fractions Treated to Date: 2
Plan Prescribed Dose Per Fraction: 1.8 Gy
Plan Total Fractions Prescribed: 25
Plan Total Prescribed Dose: 45 Gy
Reference Point Dosage Given to Date: 3.6 Gy
Reference Point Session Dosage Given: 1.8 Gy
Session Number: 2

## 2023-11-05 ENCOUNTER — Other Ambulatory Visit: Payer: Self-pay

## 2023-11-05 ENCOUNTER — Ambulatory Visit
Admission: RE | Admit: 2023-11-05 | Discharge: 2023-11-05 | Disposition: A | Discharge status: Home / Self Care | Payer: PPO | Source: Ambulatory Visit | Attending: Radiation Oncology | Admitting: Radiation Oncology

## 2023-11-05 DIAGNOSIS — Z510 Encounter for antineoplastic radiation therapy: Secondary | ICD-10-CM | POA: Diagnosis not present

## 2023-11-05 LAB — RAD ONC ARIA SESSION SUMMARY
Course Elapsed Days: 2
Plan Fractions Treated to Date: 3
Plan Prescribed Dose Per Fraction: 1.8 Gy
Plan Total Fractions Prescribed: 25
Plan Total Prescribed Dose: 45 Gy
Reference Point Dosage Given to Date: 5.4 Gy
Reference Point Session Dosage Given: 1.8 Gy
Session Number: 3

## 2023-11-06 ENCOUNTER — Other Ambulatory Visit: Payer: Self-pay

## 2023-11-06 ENCOUNTER — Ambulatory Visit
Admission: RE | Admit: 2023-11-06 | Discharge: 2023-11-06 | Disposition: A | Discharge status: Home / Self Care | Payer: PPO | Source: Ambulatory Visit | Attending: Radiation Oncology | Admitting: Radiation Oncology

## 2023-11-06 DIAGNOSIS — Z510 Encounter for antineoplastic radiation therapy: Secondary | ICD-10-CM | POA: Diagnosis not present

## 2023-11-06 LAB — RAD ONC ARIA SESSION SUMMARY
Course Elapsed Days: 3
Plan Fractions Treated to Date: 4
Plan Prescribed Dose Per Fraction: 1.8 Gy
Plan Total Fractions Prescribed: 25
Plan Total Prescribed Dose: 45 Gy
Reference Point Dosage Given to Date: 7.2 Gy
Reference Point Session Dosage Given: 1.8 Gy
Session Number: 4

## 2023-11-07 ENCOUNTER — Other Ambulatory Visit: Payer: Self-pay

## 2023-11-07 ENCOUNTER — Ambulatory Visit
Admission: RE | Admit: 2023-11-07 | Discharge: 2023-11-07 | Disposition: A | Discharge status: Home / Self Care | Payer: PPO | Source: Ambulatory Visit | Attending: Radiation Oncology | Admitting: Radiation Oncology

## 2023-11-07 DIAGNOSIS — Z510 Encounter for antineoplastic radiation therapy: Secondary | ICD-10-CM | POA: Diagnosis not present

## 2023-11-07 LAB — RAD ONC ARIA SESSION SUMMARY
Course Elapsed Days: 4
Plan Fractions Treated to Date: 5
Plan Prescribed Dose Per Fraction: 1.8 Gy
Plan Total Fractions Prescribed: 25
Plan Total Prescribed Dose: 45 Gy
Reference Point Dosage Given to Date: 9 Gy
Reference Point Session Dosage Given: 1.8 Gy
Session Number: 5

## 2023-11-10 ENCOUNTER — Other Ambulatory Visit: Payer: Self-pay

## 2023-11-10 ENCOUNTER — Ambulatory Visit
Admission: RE | Admit: 2023-11-10 | Discharge: 2023-11-10 | Disposition: A | Discharge status: Home / Self Care | Payer: PPO | Source: Ambulatory Visit | Attending: Radiation Oncology

## 2023-11-10 DIAGNOSIS — Z510 Encounter for antineoplastic radiation therapy: Secondary | ICD-10-CM | POA: Diagnosis not present

## 2023-11-10 LAB — RAD ONC ARIA SESSION SUMMARY
Course Elapsed Days: 7
Plan Fractions Treated to Date: 6
Plan Prescribed Dose Per Fraction: 1.8 Gy
Plan Total Fractions Prescribed: 25
Plan Total Prescribed Dose: 45 Gy
Reference Point Dosage Given to Date: 10.8 Gy
Reference Point Session Dosage Given: 1.8 Gy
Session Number: 6

## 2023-11-10 NOTE — Progress Notes (Signed)
Specialty Pharmacy Refill Coordination Note  Samantha Jenkins is a 83 y.o. female contacted today regarding refills of specialty medication(s) No data recorded  Patient requested (Patient-Rptd) Pickup at [facility] date: (Patient-Rptd) 11/20/23   Medication will be filled on 11/18/23.

## 2023-11-11 ENCOUNTER — Other Ambulatory Visit: Payer: Self-pay

## 2023-11-11 ENCOUNTER — Ambulatory Visit
Admission: RE | Admit: 2023-11-11 | Discharge: 2023-11-11 | Disposition: A | Discharge status: Home / Self Care | Payer: PPO | Source: Ambulatory Visit | Attending: Radiation Oncology | Admitting: Radiation Oncology

## 2023-11-11 DIAGNOSIS — Z510 Encounter for antineoplastic radiation therapy: Secondary | ICD-10-CM | POA: Diagnosis not present

## 2023-11-11 LAB — RAD ONC ARIA SESSION SUMMARY
Course Elapsed Days: 8
Plan Fractions Treated to Date: 7
Plan Prescribed Dose Per Fraction: 1.8 Gy
Plan Total Fractions Prescribed: 25
Plan Total Prescribed Dose: 45 Gy
Reference Point Dosage Given to Date: 12.6 Gy
Reference Point Session Dosage Given: 1.8 Gy
Session Number: 7

## 2023-11-13 ENCOUNTER — Other Ambulatory Visit: Payer: Self-pay

## 2023-11-13 ENCOUNTER — Ambulatory Visit
Admission: RE | Admit: 2023-11-13 | Discharge: 2023-11-13 | Disposition: A | Discharge status: Home / Self Care | Payer: PPO | Source: Ambulatory Visit | Attending: Radiation Oncology | Admitting: Radiation Oncology

## 2023-11-13 DIAGNOSIS — Z510 Encounter for antineoplastic radiation therapy: Secondary | ICD-10-CM | POA: Diagnosis not present

## 2023-11-13 LAB — RAD ONC ARIA SESSION SUMMARY
Course Elapsed Days: 10
Plan Fractions Treated to Date: 8
Plan Prescribed Dose Per Fraction: 1.8 Gy
Plan Total Fractions Prescribed: 25
Plan Total Prescribed Dose: 45 Gy
Reference Point Dosage Given to Date: 14.4 Gy
Reference Point Session Dosage Given: 1.8 Gy
Session Number: 8

## 2023-11-14 ENCOUNTER — Ambulatory Visit
Admission: RE | Admit: 2023-11-14 | Discharge: 2023-11-14 | Disposition: A | Discharge status: Home / Self Care | Payer: PPO | Source: Ambulatory Visit | Attending: Radiation Oncology

## 2023-11-14 ENCOUNTER — Ambulatory Visit
Admission: RE | Admit: 2023-11-14 | Discharge: 2023-11-14 | Disposition: A | Discharge status: Home / Self Care | Payer: PPO | Source: Ambulatory Visit | Attending: Radiation Oncology | Admitting: Radiation Oncology

## 2023-11-14 ENCOUNTER — Other Ambulatory Visit: Payer: Self-pay

## 2023-11-14 DIAGNOSIS — Z510 Encounter for antineoplastic radiation therapy: Secondary | ICD-10-CM | POA: Diagnosis not present

## 2023-11-14 LAB — RAD ONC ARIA SESSION SUMMARY
Course Elapsed Days: 11
Plan Fractions Treated to Date: 9
Plan Prescribed Dose Per Fraction: 1.8 Gy
Plan Total Fractions Prescribed: 25
Plan Total Prescribed Dose: 45 Gy
Reference Point Dosage Given to Date: 16.2 Gy
Reference Point Session Dosage Given: 1.8 Gy
Session Number: 9

## 2023-11-16 ENCOUNTER — Other Ambulatory Visit: Payer: Self-pay | Admitting: Nurse Practitioner

## 2023-11-16 DIAGNOSIS — C20 Malignant neoplasm of rectum: Secondary | ICD-10-CM

## 2023-11-16 NOTE — Progress Notes (Signed)
 Patient Care Team: Samantha Cole, MD as PCP - General (Family Medicine) Samantha Callander, MD as Consulting Physician (Hematology and Oncology)   CHIEF COMPLAINT: Follow-up rectal cancer  Oncology History  Adenocarcinoma of rectum (HCC)  09/24/2023 Imaging   CT chest abdomen and pelvis with contrast  IMPRESSION: 1. Rectal primary with nodal metastasis in the mesorectum and sigmoid mesocolon. 2. No distant metastasis. 3. Chronic left-sided hydronephrosis with overlying cortical thinning and decreased/absent left renal function. Favor chronic ureteropelvic junction obstruction, given absence of hydroureter. 4. 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 5. Incidental findings, including: Coronary artery atherosclerosis. Aortic Atherosclerosis (ICD10-I70.0). Cholelithiasis. Bilateral nephrolithiasis.   10/02/2023 Initial Diagnosis   Adenocarcinoma of rectum (HCC)   10/24/2023 Cancer Staging   Staging form: Colon and Rectum, AJCC 8th Edition - Clinical stage from 10/24/2023: Stage IIIB (cT3, cN1, cM0) - Signed by Samantha Callander, MD on 10/31/2023 Histologic grade (G): G2 Histologic grading system: 4 grade system      CURRENT THERAPY: TNT (total neoadjuvant therapy), with concurrent chemo RT with Xeloda  first -starting 11/03/2023 - followed by modified FOLFOX  INTERVAL HISTORY Samantha Jenkins returns for follow-up as scheduled, last seen by Dr. Lanny 10/29/2023.  She continues chemo RT, getting harder with more fatigue. Took 4 hour nap after 1 errand this week. Has diarrhea 3-4 times per week with persistent gelatinous blood that is stable since starting treatment. Denies hand/foot syndrome, rash, fever/chills or other new or specific complaints.   ROS  All other systems reviewed and negative  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   Depression    Dysthymia    FHx: cardiovascular disease    History of blood transfusion as child   History of kidney stones    Hyperlipidemia    Hypertension    LBBB (left bundle branch block) 2014   on ekg   Obesity    Pneumonia    hx of as a child   Vitamin D  deficiency      Past Surgical History:  Procedure Laterality Date   ABDOMINAL HYSTERECTOMY  1986   partial   APPENDECTOMY  1986   with hysterectomy   CATARACT EXTRACTION Bilateral 2016   CYSTOSCOPY/URETEROSCOPY/HOLMIUM LASER/STENT PLACEMENT Left 02/09/2021   Procedure: LEFT URETEROSCOPY/ LASER LITHOTRIPSY  AND  STENT PLACEMENT,ENDOPYLOTOMY;  Surgeon: Cam Morene ORN, MD;  Location: [facility];  Service: Urology;  Laterality: Left;   CYSTOSCOPY/URETEROSCOPY/HOLMIUM LASER/STENT PLACEMENT Left 03/15/2021   Procedure: CYSTOSCOPY, LEFT URETEROSCOPY, HOLMIUM LASER ENDOPYELOTOMY, URETERAL STENT PLACEMENT;  Surgeon: Cam Morene ORN, MD;  Location: [facility];  Service: Urology;  Laterality: Left;   EYE SURGERY  2016   bil cataracts   HOLMIUM LASER APPLICATION Left 02/09/2021   Procedure: HOLMIUM LASER APPLICATION;  Surgeon: Cam Morene ORN, MD;  Location: [facility];  Service: Urology;  Laterality: Left;   KNEE ARTHROSCOPY  1995   left   NEPHROLITHOTOMY Left 03/23/2020   Procedure: LEFT NEPHROLITHOTOMY PERCUTANEOUS WITH SURGEON ACCESS;  Surgeon: Cam Morene ORN, MD;  Location: [facility];  Service: Urology;  Laterality: Left;   TONSILLECTOMY  1945   adenoids removed   TOTAL HIP ARTHROPLASTY Left 06/03/2023   Procedure: TOTAL HIP ARTHROPLASTY ANTERIOR APPROACH;  Surgeon: Ernie Cough, MD;  Location: [facility];  Service: Orthopedics;  Laterality: Left;   TOTAL KNEE ARTHROPLASTY Right 06/06/2020   Procedure: TOTAL KNEE ARTHROPLASTY;  Surgeon: Ernie Cough, MD;  Location: [facility];  Service: Orthopedics;  Laterality: Right;  70 mins   TOTAL KNEE ARTHROPLASTY Left 07/17/2021   Procedure: TOTAL  KNEE ARTHROPLASTY;  Surgeon: Ernie Cough, MD;  Location: [facility];  Service: Orthopedics;  Laterality: Left;   TUBAL LIGATION  1979     Outpatient Encounter Medications as of 11/18/2023  Medication Sig   albuterol  (PROVENTIL  HFA;VENTOLIN  HFA) 108 (90 BASE) MCG/ACT inhaler Inhale 2 puffs into the lungs every 6 (six) hours as needed for wheezing or shortness of breath.   bacitracin-polymyxin b (POLYSPORIN) ointment Apply 1 application topically daily as needed (wound care).   bisoprolol -hydrochlorothiazide  (ZIAC ) 5-6.25 MG tablet TAKE 1 TABLET BY MOUTH DAILY. (Patient taking differently: Take 1 tablet by mouth at bedtime.)   capecitabine  (XELODA ) 500 MG tablet Take 3 tablets (1,500 mg total) by mouth 2 (two) times daily after a meal. TAKE 30 min after meal, take on days of radiation, Monday through Friday   diclofenac Sodium (VOLTAREN) 1 % GEL Apply 1 Application topically 2 (two) times daily.   pravastatin  (PRAVACHOL ) 40 MG tablet TAKE 1 TABLET EVERY DAY (Patient taking differently: Take 40 mg by mouth every evening.)   Propylene Glycol, PF, (SYSTANE COMPLETE PF) 0.6 % SOLN Place 1 drop into both eyes daily.   sertraline  (ZOLOFT ) 100 MG tablet TAKE 1 TABLET EVERY DAY (Patient taking differently: Take 150 mg by mouth at bedtime.)   traZODone  (DESYREL ) 50 MG tablet TAKE ONE TABLET BY MOUTH ONCE a DAY at bedtime AS NEEDED   No facility-administered encounter medications on file as of 11/18/2023.     Today's Vitals   11/18/23 1054  BP: 132/70  Pulse: (!) 50  Resp: 15  Temp: 97.7 F (36.5 C)  TempSrc: Temporal  SpO2: 98%  Weight: 173 lb 8 oz (78.7 kg)   Body mass index is 28.87 kg/m.   PHYSICAL EXAM GENERAL:alert, no distress and comfortable SKIN: no rash  EYES: sclera clear LUNGS: clear with normal breathing effort HEART: regular rate & rhythm, no lower extremity edema NEURO: alert & oriented x 3 with fluent speech   CBC    Component Value Date/Time   WBC 4.3 11/18/2023 1035    WBC 10.6 (H) 06/04/2023 0340   RBC 3.97 11/18/2023 1035   HGB 12.5 11/18/2023 1035   HCT 36.1 11/18/2023 1035   PLT 155 11/18/2023 1035   MCV 90.9 11/18/2023 1035   MCH 31.5 11/18/2023 1035   MCHC 34.6 11/18/2023 1035   RDW 15.6 (H) 11/18/2023 1035   LYMPHSABS 0.6 (L) 11/18/2023 1035   MONOABS 0.3 11/18/2023 1035   EOSABS 0.2 11/18/2023 1035   BASOSABS 0.0 11/18/2023 1035     CMP     Component Value Date/Time   NA 142 11/18/2023 1035   K 3.5 11/18/2023 1035   CL 105 11/18/2023 1035   CO2 30 11/18/2023 1035   GLUCOSE 102 (H) 11/18/2023 1035   BUN 11 11/18/2023 1035   CREATININE 1.01 (H) 11/18/2023 1035   CREATININE 0.71 03/30/2015 0951   CALCIUM  9.8 11/18/2023 1035   PROT 6.8 11/18/2023 1035   ALBUMIN 3.9 11/18/2023 1035   AST 17 11/18/2023 1035   ALT 9 11/18/2023 1035   ALKPHOS 107 11/18/2023 1035   BILITOT 0.8 11/18/2023 1035   GFRNONAA 55 (L) 11/18/2023 1035   GFRAA 46 (L) 06/07/2020 0304  ASSESSMENT & PLAN: 82 yo female    Adenocarcinoma of rectum; cT3bN1M0, stage IIIB, G2 -presented with rectal bleeding.  I reviewed her recent colonoscopy findings, biopsy results, and her staging CT images.  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  -pelvic MRI 10/24/2023 showed T3bN1 -pt was seen by surgeon Samantha Jenkins and rad/onc Samantha Jenkins -she agrees with TNT starting with concurrent chemoRT with Xeloda  11/03/23, followed by dose modified FOLFOX  -Will check MMR  -Samantha Jenkins appears stable, tolerating ccRT with Xeloda  with mild fatigue and periodic diarrhea. SEs adequately managed with supportive care at home -Able to recover and function with adequate PS. Labs stable. Continue ccRT   PLAN: -Labs reviewed -Continue ccRT with Xeloda  at same dose -Reviewed sleep hygiene  -F/up in 2 weeks   All questions were answered. The patient knows to call the clinic with any problems, questions or concerns. No barriers to learning were detected.    Taggert Bozzi, NP-C 11/18/2023

## 2023-11-17 ENCOUNTER — Ambulatory Visit
Admission: RE | Admit: 2023-11-17 | Discharge: 2023-11-17 | Disposition: A | Discharge status: Home / Self Care | Payer: PPO | Source: Ambulatory Visit | Attending: Radiation Oncology | Admitting: Radiation Oncology

## 2023-11-17 ENCOUNTER — Other Ambulatory Visit: Payer: Self-pay

## 2023-11-17 DIAGNOSIS — Z510 Encounter for antineoplastic radiation therapy: Secondary | ICD-10-CM | POA: Diagnosis not present

## 2023-11-17 LAB — RAD ONC ARIA SESSION SUMMARY
Course Elapsed Days: 14
Plan Fractions Treated to Date: 10
Plan Prescribed Dose Per Fraction: 1.8 Gy
Plan Total Fractions Prescribed: 25
Plan Total Prescribed Dose: 45 Gy
Reference Point Dosage Given to Date: 18 Gy
Reference Point Session Dosage Given: 1.8 Gy
Session Number: 10

## 2023-11-18 ENCOUNTER — Ambulatory Visit
Admission: RE | Admit: 2023-11-18 | Discharge: 2023-11-18 | Disposition: A | Discharge status: Home / Self Care | Payer: PPO | Source: Ambulatory Visit | Attending: Radiation Oncology | Admitting: Radiation Oncology

## 2023-11-18 ENCOUNTER — Other Ambulatory Visit: Payer: Self-pay

## 2023-11-18 ENCOUNTER — Inpatient Hospital Stay: Payer: PPO

## 2023-11-18 ENCOUNTER — Encounter: Payer: Self-pay | Admitting: Nurse Practitioner

## 2023-11-18 ENCOUNTER — Inpatient Hospital Stay: Payer: PPO | Admitting: Nurse Practitioner

## 2023-11-18 VITALS — BP 132/70 | HR 50 | Temp 97.7°F | Resp 15 | Wt 173.5 lb

## 2023-11-18 DIAGNOSIS — C20 Malignant neoplasm of rectum: Secondary | ICD-10-CM

## 2023-11-18 DIAGNOSIS — Z510 Encounter for antineoplastic radiation therapy: Secondary | ICD-10-CM | POA: Diagnosis not present

## 2023-11-18 LAB — RAD ONC ARIA SESSION SUMMARY
Course Elapsed Days: 15
Plan Fractions Treated to Date: 11
Plan Prescribed Dose Per Fraction: 1.8 Gy
Plan Total Fractions Prescribed: 25
Plan Total Prescribed Dose: 45 Gy
Reference Point Dosage Given to Date: 19.8 Gy
Reference Point Session Dosage Given: 1.8 Gy
Session Number: 11

## 2023-11-18 LAB — CBC WITH DIFFERENTIAL (CANCER CENTER ONLY)
Abs Immature Granulocytes: 0.01 10*3/uL (ref 0.00–0.07)
Basophils Absolute: 0 10*3/uL (ref 0.0–0.1)
Basophils Relative: 1 %
Eosinophils Absolute: 0.2 10*3/uL (ref 0.0–0.5)
Eosinophils Relative: 4 %
HCT: 36.1 % (ref 36.0–46.0)
Hemoglobin: 12.5 g/dL (ref 12.0–15.0)
Immature Granulocytes: 0 %
Lymphocytes Relative: 13 %
Lymphs Abs: 0.6 10*3/uL — ABNORMAL LOW (ref 0.7–4.0)
MCH: 31.5 pg (ref 26.0–34.0)
MCHC: 34.6 g/dL (ref 30.0–36.0)
MCV: 90.9 fL (ref 80.0–100.0)
Monocytes Absolute: 0.3 10*3/uL (ref 0.1–1.0)
Monocytes Relative: 8 %
Neutro Abs: 3.2 10*3/uL (ref 1.7–7.7)
Neutrophils Relative %: 74 %
Platelet Count: 155 10*3/uL (ref 150–400)
RBC: 3.97 MIL/uL (ref 3.87–5.11)
RDW: 15.6 % — ABNORMAL HIGH (ref 11.5–15.5)
WBC Count: 4.3 10*3/uL (ref 4.0–10.5)
nRBC: 0 % (ref 0.0–0.2)

## 2023-11-18 LAB — CMP (CANCER CENTER ONLY)
ALT: 9 U/L (ref 0–44)
AST: 17 U/L (ref 15–41)
Albumin: 3.9 g/dL (ref 3.5–5.0)
Alkaline Phosphatase: 107 U/L (ref 38–126)
Anion gap: 7 (ref 5–15)
BUN: 11 mg/dL (ref 8–23)
CO2: 30 mmol/L (ref 22–32)
Calcium: 9.8 mg/dL (ref 8.9–10.3)
Chloride: 105 mmol/L (ref 98–111)
Creatinine: 1.01 mg/dL — ABNORMAL HIGH (ref 0.44–1.00)
GFR, Estimated: 55 mL/min — ABNORMAL LOW (ref >60)
Glucose, Bld: 102 mg/dL — ABNORMAL HIGH (ref 70–99)
Potassium: 3.5 mmol/L (ref 3.5–5.1)
Sodium: 142 mmol/L (ref 135–145)
Total Bilirubin: 0.8 mg/dL (ref 0.0–1.2)
Total Protein: 6.8 g/dL (ref 6.5–8.1)

## 2023-11-20 ENCOUNTER — Ambulatory Visit
Admission: RE | Admit: 2023-11-20 | Discharge: 2023-11-20 | Disposition: A | Discharge status: Home / Self Care | Payer: PPO | Source: Ambulatory Visit | Attending: Radiation Oncology | Admitting: Radiation Oncology

## 2023-11-20 ENCOUNTER — Other Ambulatory Visit: Payer: Self-pay

## 2023-11-20 DIAGNOSIS — Z510 Encounter for antineoplastic radiation therapy: Secondary | ICD-10-CM | POA: Insufficient documentation

## 2023-11-20 DIAGNOSIS — C772 Secondary and unspecified malignant neoplasm of intra-abdominal lymph nodes: Secondary | ICD-10-CM | POA: Diagnosis not present

## 2023-11-20 DIAGNOSIS — R32 Unspecified urinary incontinence: Secondary | ICD-10-CM | POA: Diagnosis not present

## 2023-11-20 DIAGNOSIS — C20 Malignant neoplasm of rectum: Secondary | ICD-10-CM | POA: Diagnosis not present

## 2023-11-20 DIAGNOSIS — R159 Full incontinence of feces: Secondary | ICD-10-CM | POA: Diagnosis not present

## 2023-11-20 LAB — RAD ONC ARIA SESSION SUMMARY
Course Elapsed Days: 17
Plan Fractions Treated to Date: 12
Plan Prescribed Dose Per Fraction: 1.8 Gy
Plan Total Fractions Prescribed: 25
Plan Total Prescribed Dose: 45 Gy
Reference Point Dosage Given to Date: 21.6 Gy
Reference Point Session Dosage Given: 1.8 Gy
Session Number: 12

## 2023-11-21 ENCOUNTER — Ambulatory Visit
Admission: RE | Admit: 2023-11-21 | Discharge: 2023-11-21 | Disposition: A | Discharge status: Home / Self Care | Payer: PPO | Source: Ambulatory Visit | Attending: Radiation Oncology

## 2023-11-21 ENCOUNTER — Ambulatory Visit
Admission: RE | Admit: 2023-11-21 | Discharge: 2023-11-21 | Disposition: A | Discharge status: Home / Self Care | Payer: PPO | Source: Ambulatory Visit | Attending: Radiation Oncology | Admitting: Radiation Oncology

## 2023-11-21 ENCOUNTER — Other Ambulatory Visit: Payer: Self-pay

## 2023-11-21 DIAGNOSIS — Z510 Encounter for antineoplastic radiation therapy: Secondary | ICD-10-CM | POA: Diagnosis not present

## 2023-11-21 LAB — RAD ONC ARIA SESSION SUMMARY
Course Elapsed Days: 18
Plan Fractions Treated to Date: 13
Plan Prescribed Dose Per Fraction: 1.8 Gy
Plan Total Fractions Prescribed: 25
Plan Total Prescribed Dose: 45 Gy
Reference Point Dosage Given to Date: 23.4 Gy
Reference Point Session Dosage Given: 1.8 Gy
Session Number: 13

## 2023-11-24 ENCOUNTER — Other Ambulatory Visit: Payer: Self-pay

## 2023-11-24 ENCOUNTER — Other Ambulatory Visit (HOSPITAL_COMMUNITY): Payer: Self-pay

## 2023-11-24 ENCOUNTER — Ambulatory Visit
Admission: RE | Admit: 2023-11-24 | Discharge: 2023-11-24 | Disposition: A | Discharge status: Home / Self Care | Payer: PPO | Source: Ambulatory Visit | Attending: Radiation Oncology | Admitting: Radiation Oncology

## 2023-11-24 DIAGNOSIS — Z510 Encounter for antineoplastic radiation therapy: Secondary | ICD-10-CM | POA: Diagnosis not present

## 2023-11-24 LAB — RAD ONC ARIA SESSION SUMMARY
Course Elapsed Days: 21
Plan Fractions Treated to Date: 14
Plan Prescribed Dose Per Fraction: 1.8 Gy
Plan Total Fractions Prescribed: 25
Plan Total Prescribed Dose: 45 Gy
Reference Point Dosage Given to Date: 25.2 Gy
Reference Point Session Dosage Given: 1.8 Gy
Session Number: 14

## 2023-11-24 NOTE — Progress Notes (Signed)
 Specialty Pharmacy Ongoing Clinical Assessment Note  Samantha Jenkins is a 83 y.o. female who is being followed by the specialty pharmacy service for RxSp Oncology   Patient's specialty medication(s) reviewed today: Capecitabine  (XELODA )   Missed doses in the last 4 weeks: 0   Patient/Caregiver did not have any additional questions or concerns.   Therapeutic benefit summary: Patient is achieving benefit   Adverse events/side effects summary: Experienced adverse events/side effects (Fatigue)   Patient's therapy is appropriate to: Continue    Goals Addressed             This Visit's Progress    Slow Disease Progression       Patient is unable to be assessed as therapy was recently initiated. Patient will maintain adherence          Follow up:  No additional follow up needed at this time as patient has been dispensed remainder of intended therapy.   Samantha Jenkins M Samantha Jenkins Specialty Pharmacist

## 2023-11-25 ENCOUNTER — Ambulatory Visit
Admission: RE | Admit: 2023-11-25 | Discharge: 2023-11-25 | Disposition: A | Discharge status: Home / Self Care | Payer: PPO | Source: Ambulatory Visit | Attending: Radiation Oncology

## 2023-11-25 ENCOUNTER — Other Ambulatory Visit: Payer: Self-pay

## 2023-11-25 DIAGNOSIS — Z510 Encounter for antineoplastic radiation therapy: Secondary | ICD-10-CM | POA: Diagnosis not present

## 2023-11-25 LAB — RAD ONC ARIA SESSION SUMMARY
Course Elapsed Days: 22
Plan Fractions Treated to Date: 15
Plan Prescribed Dose Per Fraction: 1.8 Gy
Plan Total Fractions Prescribed: 25
Plan Total Prescribed Dose: 45 Gy
Reference Point Dosage Given to Date: 27 Gy
Reference Point Session Dosage Given: 1.8 Gy
Session Number: 15

## 2023-11-26 ENCOUNTER — Other Ambulatory Visit: Payer: Self-pay

## 2023-11-26 ENCOUNTER — Ambulatory Visit
Admission: RE | Admit: 2023-11-26 | Discharge: 2023-11-26 | Disposition: A | Discharge status: Home / Self Care | Payer: PPO | Source: Ambulatory Visit | Attending: Radiation Oncology | Admitting: Radiation Oncology

## 2023-11-26 DIAGNOSIS — Z510 Encounter for antineoplastic radiation therapy: Secondary | ICD-10-CM | POA: Diagnosis not present

## 2023-11-26 LAB — RAD ONC ARIA SESSION SUMMARY
Course Elapsed Days: 23
Plan Fractions Treated to Date: 16
Plan Prescribed Dose Per Fraction: 1.8 Gy
Plan Total Fractions Prescribed: 25
Plan Total Prescribed Dose: 45 Gy
Reference Point Dosage Given to Date: 28.8 Gy
Reference Point Session Dosage Given: 1.8 Gy
Session Number: 16

## 2023-11-27 ENCOUNTER — Other Ambulatory Visit: Payer: Self-pay

## 2023-11-27 ENCOUNTER — Ambulatory Visit
Admission: RE | Admit: 2023-11-27 | Discharge: 2023-11-27 | Disposition: A | Discharge status: Home / Self Care | Payer: PPO | Source: Ambulatory Visit | Attending: Radiation Oncology | Admitting: Radiation Oncology

## 2023-11-27 DIAGNOSIS — Z510 Encounter for antineoplastic radiation therapy: Secondary | ICD-10-CM | POA: Diagnosis not present

## 2023-11-27 LAB — RAD ONC ARIA SESSION SUMMARY
Course Elapsed Days: 24
Plan Fractions Treated to Date: 17
Plan Prescribed Dose Per Fraction: 1.8 Gy
Plan Total Fractions Prescribed: 25
Plan Total Prescribed Dose: 45 Gy
Reference Point Dosage Given to Date: 30.6 Gy
Reference Point Session Dosage Given: 1.8 Gy
Session Number: 17

## 2023-11-28 ENCOUNTER — Other Ambulatory Visit: Payer: Self-pay

## 2023-11-28 ENCOUNTER — Ambulatory Visit
Admission: RE | Admit: 2023-11-28 | Discharge: 2023-11-28 | Disposition: A | Discharge status: Home / Self Care | Payer: PPO | Source: Ambulatory Visit | Attending: Radiation Oncology | Admitting: Radiation Oncology

## 2023-11-28 ENCOUNTER — Ambulatory Visit
Admission: RE | Admit: 2023-11-28 | Discharge: 2023-11-28 | Disposition: A | Discharge status: Home / Self Care | Payer: PPO | Source: Ambulatory Visit | Attending: Radiation Oncology

## 2023-11-28 DIAGNOSIS — Z510 Encounter for antineoplastic radiation therapy: Secondary | ICD-10-CM | POA: Diagnosis not present

## 2023-11-28 LAB — RAD ONC ARIA SESSION SUMMARY
Course Elapsed Days: 25
Plan Fractions Treated to Date: 18
Plan Prescribed Dose Per Fraction: 1.8 Gy
Plan Total Fractions Prescribed: 25
Plan Total Prescribed Dose: 45 Gy
Reference Point Dosage Given to Date: 32.4 Gy
Reference Point Session Dosage Given: 1.8 Gy
Session Number: 18

## 2023-12-01 ENCOUNTER — Other Ambulatory Visit: Payer: Self-pay

## 2023-12-01 ENCOUNTER — Ambulatory Visit
Admission: RE | Admit: 2023-12-01 | Discharge: 2023-12-01 | Disposition: A | Discharge status: Home / Self Care | Payer: PPO | Source: Ambulatory Visit | Attending: Radiation Oncology

## 2023-12-01 DIAGNOSIS — Z510 Encounter for antineoplastic radiation therapy: Secondary | ICD-10-CM | POA: Diagnosis not present

## 2023-12-01 LAB — RAD ONC ARIA SESSION SUMMARY
Course Elapsed Days: 28
Plan Fractions Treated to Date: 19
Plan Prescribed Dose Per Fraction: 1.8 Gy
Plan Total Fractions Prescribed: 25
Plan Total Prescribed Dose: 45 Gy
Reference Point Dosage Given to Date: 34.2 Gy
Reference Point Session Dosage Given: 1.8 Gy
Session Number: 19

## 2023-12-01 NOTE — Assessment & Plan Note (Addendum)
-  [id number], stage IIIB, G2 -presented with rectal bleeding. Recent colonoscopy findings, biopsy results, and her staging CT images were reviewed with her.  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  -pelvic MRI 10/24/2023 showed rectal adenocarcinoma T stage: T3b; rectal adenocarcinoma N stage: N1; distance from tumor to the internal anal sphincter is 7.4 cm.  -pt was seen by surgeon Dr. Debby and rad/onc Dr. Dewey -she agrees with TNT, will start concurrent chemoRT with Xeloda  first, followed by dose modified FOLFOX  -she is tolerating radiation treatment well overall. Last scheduled treatment is 12/12/2023. Reports mild skin irritation and persistent diarrhea with gelatinous blood present.

## 2023-12-01 NOTE — Progress Notes (Signed)
 Patient Care Team: Benedetto Brady, MD as PCP - General (Family Medicine) Sonja [place], MD as Consulting Physician (Hematology and Oncology)  Clinic Day:  12/03/2023  Referring physician: Benedetto Brady, MD  ASSESSMENT & PLAN:   Assessment & Plan: Adenocarcinoma of rectum (HCC) -cT3bN1M0, stage IIIB, G2 -presented with rectal bleeding. Recent colonoscopy findings, biopsy results, and her staging CT images were reviewed with her.  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  -pelvic MRI 10/24/2023 showed rectal adenocarcinoma T stage: T3b; rectal adenocarcinoma N stage: N1; distance from tumor to the internal anal sphincter is 7.4 cm.  -pt was seen by surgeon Dr. Andy Bannister and rad/onc Dr. Jeryl Moris -she agrees with TNT, will start concurrent chemoRT with Xeloda  first, followed by dose modified FOLFOX  -she is tolerating radiation treatment well overall. Last scheduled treatment is 12/12/2023. Reports mild skin irritation and persistent diarrhea with "gelatinous" blood present.     Plan: Labs reviewed  -CBC showing WBC 3.2; Hgb 12.2; Hct 34.8; Plt 116; Anc 2.5 -CMP - K 3.2; glucose 98; BUN 11; Creatinine 0.92; eGFR >60; Ca 9.4; LFTs normal.   -CEA pending Add potassium 10 mEq daily. Sent to [facility].  Radiation should continue as scheduled. Last treatment scheduled for 12/12/2023. Follow up with labs as scheduled on 12/17/2023.   The patient understands the plans discussed today and is in agreement with them.  She knows to contact our office if she develops concerns prior to her next appointment.  I provided 25 minutes of face-to-face time during this encounter and > 50% was spent counseling as documented under my assessment and plan.    Sharyon Deis, NP   [facility] - A DEPT OF [facility] [address] [place] [address] Dept: [phone] Dept Fax: [phone]   No orders of the  defined types were placed in this encounter.     CHIEF COMPLAINT:  CC: Rectal cancer   Current Treatment:  concurrent chemo RT with Xeloda , started 11/03/2023.  Will follow-up with dose modified FOLFOX  INTERVAL HISTORY:  Samantha Jenkins is here today for repeat clinical assessment.  She was last seen by Lacie, NP on 11/18/2023.  Improved fatigue.  Has had diarrhea 1-2 times per week with persistent, gelatinous blood in stool. Has not changed since treatment started.  She takes imodium for this which is helpful. She has recently noted some skin irritation to coccyx. She is using "[employer]" as needed which is also helpful. Will use this cream on hands when they get dry also. She denies chest pain, chest pressure, or shortness of breath. She denies headaches or visual disturbances.  She denies fevers or chills. She denies pain. Her appetite is fair. She states that many foods just don't appeal to her at this time. Her weight has increased 3 pounds over last 3 weeks .  I have reviewed the past medical history, past surgical history, social history and family history with the patient and they are unchanged from previous note.  ALLERGIES:  is allergic to statins and sulfa antibiotics.  MEDICATIONS:  Current Outpatient Medications  Medication Sig Dispense Refill   albuterol  (PROVENTIL  HFA;VENTOLIN  HFA) 108 (90 BASE) MCG/ACT inhaler Inhale 2 puffs into the lungs every 6 (six) hours as needed for wheezing or shortness of breath. 1 Inhaler 0   bacitracin-polymyxin b (POLYSPORIN) ointment Apply 1 application topically daily as needed (wound care).     bisoprolol -hydrochlorothiazide  (  ZIAC ) 5-6.25 MG tablet TAKE 1 TABLET BY MOUTH DAILY. (Patient taking differently: Take 1 tablet by mouth at bedtime.) 90 tablet 0   capecitabine  (XELODA ) 500 MG tablet Take 3 tablets (1,500 mg total) by mouth 2 (two) times daily after a meal. TAKE 30 min after meal, take on days of radiation, Monday through Friday 90 tablet 1    diclofenac Sodium (VOLTAREN) 1 % GEL Apply 1 Application topically 2 (two) times daily.     potassium chloride  (KLOR-CON  M) 10 MEQ tablet Take 1 tablet (10 mEq total) by mouth daily. 30 tablet 1   pravastatin  (PRAVACHOL ) 40 MG tablet TAKE 1 TABLET EVERY DAY (Patient taking differently: Take 40 mg by mouth every evening.) 90 tablet 0   Propylene Glycol, PF, (SYSTANE COMPLETE PF) 0.6 % SOLN Place 1 drop into both eyes daily.     sertraline  (ZOLOFT ) 100 MG tablet TAKE 1 TABLET EVERY DAY (Patient taking differently: Take 150 mg by mouth at bedtime.) 90 tablet 0   traZODone  (DESYREL ) 50 MG tablet TAKE ONE TABLET BY MOUTH ONCE a DAY at bedtime AS NEEDED     No current facility-administered medications for this visit.    HISTORY OF PRESENT ILLNESS:   Oncology History  Adenocarcinoma of rectum (HCC)  09/24/2023 Imaging   CT chest abdomen and pelvis with contrast  IMPRESSION: 1. Rectal primary with nodal metastasis in the mesorectum and sigmoid mesocolon. 2. No distant metastasis. 3. Chronic left-sided hydronephrosis with overlying cortical thinning and decreased/absent left renal function. Favor chronic ureteropelvic junction obstruction, given absence of hydroureter. 4. 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 5. Incidental findings, including: Coronary artery atherosclerosis. Aortic Atherosclerosis (ICD10-I70.0). Cholelithiasis. Bilateral nephrolithiasis.   10/02/2023 Initial Diagnosis   Adenocarcinoma of rectum (HCC)   10/24/2023 Cancer Staging   Staging form: Colon and Rectum, AJCC 8th Edition - Clinical stage from 10/24/2023: Stage IIIB (cT3, cN1, cM0) - Signed by Sonja , MD on 10/31/2023 Histologic grade (G): G2 Histologic grading system: 4 grade system   10/29/2023 Imaging   Pelvic MRI FINDINGS: TUMOR LOCATION  Tumor distance from Anal Verge/Skin surface: 11.1 cm.  Tumor distance to Internal Anal sphincter: 7.4 cm.  TUMOR  DESCRIPTION  Circumferential extent: 9 to 6 o'clock position, 75% of the total circumference, along the superior aspect.   Tumor Size and volume: 4.6 cm craniocaudal. Up to 1.9 cm in thickness.   T - CATEGORY   Extension through Muscularis Propria: Yes 1-27mm=T3b (approximately 1.6-1.8 mm.)   Shortest Distance of any tumor/node from Mesorectal fascia: 1.4 cm.   Extramural Vascular Invasion/Tumor Thrombus: No.   Invasion of Anterior Peritoneal Reflection: No.  Involvement of Adjacent Organs or Pelvic Sidewall: No.   Levator Ani Involvement: No.   N - CATEGORY   Mesorectal Lymph Nodes >=36mm: 1-3=N1   Extra-mesorectal Lymphadenopathy: No   Other: There are multiple sigmoid diverticula without imaging signs diverticulitis. Urinary bladder is decompressed. Surgically absent uterus. No large adnexal mass seen.   IMPRESSION: Rectal adenocarcinoma T stage: T3b   Rectal adenocarcinoma N stage: N1   Distance from tumor to the internal anal sphincter is 7.4 cm.           REVIEW OF SYSTEMS:   Constitutional: Denies fevers, chills or abnormal weight loss Eyes: Denies blurriness of vision Ears, nose, mouth, throat, and face: Denies mucositis or sore throat Respiratory: Denies cough, dyspnea or wheezes Cardiovascular: Denies palpitation, chest discomfort or lower extremity swelling Gastrointestinal:  Denies  nausea, heartburn or change in bowel habits Skin: Denies abnormal skin rashes. Has noted some irritation to skin above the rectum  Lymphatics: Denies new lymphadenopathy or easy bruising Neurological:Denies numbness, tingling or new weaknesses Behavioral/Psych: Mood is stable, no new changes  All other systems were reviewed with the patient and are negative.   VITALS:   Today's Vitals   12/03/23 1034 12/03/23 1036  BP: (!) 149/83 120/60  Pulse: 65   Resp: 19   Temp: (!) 97.2 F (36.2 C)   TempSrc: Temporal   SpO2: 97%   Weight: 176 lb 8 oz (80.1 kg)   PainSc:  0-No pain   Body  mass index is 29.37 kg/m.   Wt Readings from Last 3 Encounters:  12/03/23 176 lb 8 oz (80.1 kg)  11/18/23 173 lb 8 oz (78.7 kg)  10/29/23 174 lb 3.2 oz (79 kg)    Body mass index is 29.37 kg/m.  Performance status (ECOG): 1 - Symptomatic but completely ambulatory  PHYSICAL EXAM:   GENERAL:alert, no distress and comfortable SKIN: skin color, texture, turgor are normal, no rashes or significant lesions EYES: normal, Conjunctiva are pink and non-injected, sclera clear OROPHARYNX:no exudate, no erythema and lips, buccal mucosa, and tongue normal  NECK: supple, thyroid normal size, non-tender, without nodularity LYMPH:  no palpable lymphadenopathy in the cervical, axillary or inguinal LUNGS: clear to auscultation and percussion with normal breathing effort HEART: regular rate & rhythm and no murmurs and no lower extremity edema ABDOMEN:abdomen soft, non-tender and normal bowel sounds Musculoskeletal:no cyanosis of digits and no clubbing  NEURO: alert & oriented x 3 with fluent speech, no focal motor/sensory deficits  LABORATORY DATA:  I have reviewed the data as listed    Component Value Date/Time   NA 140 12/03/2023 1011   K 3.2 (L) 12/03/2023 1011   CL 107 12/03/2023 1011   CO2 28 12/03/2023 1011   GLUCOSE 98 12/03/2023 1011   BUN 11 12/03/2023 1011   CREATININE 0.92 12/03/2023 1011   CREATININE 0.71 03/30/2015 0951   CALCIUM  9.4 12/03/2023 1011   PROT 6.3 (L) 12/03/2023 1011   ALBUMIN 3.8 12/03/2023 1011   AST 16 12/03/2023 1011   ALT 8 12/03/2023 1011   ALKPHOS 99 12/03/2023 1011   BILITOT 0.8 12/03/2023 1011   GFRNONAA >60 12/03/2023 1011   GFRAA 46 (L) 06/07/2020 0304    Lab Results  Component Value Date   WBC 3.2 (L) 12/03/2023   NEUTROABS 2.5 12/03/2023   HGB 12.2 12/03/2023   HCT 34.8 (L) 12/03/2023   MCV 91.8 12/03/2023   PLT 116 (L) 12/03/2023

## 2023-12-02 ENCOUNTER — Other Ambulatory Visit: Payer: Self-pay

## 2023-12-02 ENCOUNTER — Ambulatory Visit
Admission: RE | Admit: 2023-12-02 | Discharge: 2023-12-02 | Disposition: A | Discharge status: Home / Self Care | Payer: PPO | Source: Ambulatory Visit | Attending: Radiation Oncology | Admitting: Radiation Oncology

## 2023-12-02 DIAGNOSIS — C20 Malignant neoplasm of rectum: Secondary | ICD-10-CM

## 2023-12-02 DIAGNOSIS — Z510 Encounter for antineoplastic radiation therapy: Secondary | ICD-10-CM | POA: Diagnosis not present

## 2023-12-02 LAB — RAD ONC ARIA SESSION SUMMARY
Course Elapsed Days: 29
Plan Fractions Treated to Date: 20
Plan Prescribed Dose Per Fraction: 1.8 Gy
Plan Total Fractions Prescribed: 25
Plan Total Prescribed Dose: 45 Gy
Reference Point Dosage Given to Date: 36 Gy
Reference Point Session Dosage Given: 1.8 Gy
Session Number: 20

## 2023-12-03 ENCOUNTER — Inpatient Hospital Stay: Payer: PPO | Attending: Nurse Practitioner

## 2023-12-03 ENCOUNTER — Other Ambulatory Visit (HOSPITAL_COMMUNITY): Payer: Self-pay

## 2023-12-03 ENCOUNTER — Other Ambulatory Visit: Payer: Self-pay

## 2023-12-03 ENCOUNTER — Inpatient Hospital Stay (HOSPITAL_BASED_OUTPATIENT_CLINIC_OR_DEPARTMENT_OTHER): Payer: PPO | Admitting: Nurse Practitioner

## 2023-12-03 ENCOUNTER — Encounter: Payer: Self-pay | Admitting: Nurse Practitioner

## 2023-12-03 ENCOUNTER — Ambulatory Visit
Admission: RE | Admit: 2023-12-03 | Discharge: 2023-12-03 | Disposition: A | Discharge status: Home / Self Care | Payer: PPO | Source: Ambulatory Visit | Attending: Radiation Oncology | Admitting: Radiation Oncology

## 2023-12-03 VITALS — BP 120/60 | HR 65 | Temp 97.2°F | Resp 19 | Wt 176.5 lb

## 2023-12-03 DIAGNOSIS — Z510 Encounter for antineoplastic radiation therapy: Secondary | ICD-10-CM | POA: Diagnosis not present

## 2023-12-03 DIAGNOSIS — C20 Malignant neoplasm of rectum: Secondary | ICD-10-CM | POA: Insufficient documentation

## 2023-12-03 LAB — RAD ONC ARIA SESSION SUMMARY
Course Elapsed Days: 30
Plan Fractions Treated to Date: 21
Plan Prescribed Dose Per Fraction: 1.8 Gy
Plan Total Fractions Prescribed: 25
Plan Total Prescribed Dose: 45 Gy
Reference Point Dosage Given to Date: 37.8 Gy
Reference Point Session Dosage Given: 1.8 Gy
Session Number: 21

## 2023-12-03 LAB — CMP (CANCER CENTER ONLY)
ALT: 8 U/L (ref 0–44)
AST: 16 U/L (ref 15–41)
Albumin: 3.8 g/dL (ref 3.5–5.0)
Alkaline Phosphatase: 99 U/L (ref 38–126)
Anion gap: 5 (ref 5–15)
BUN: 11 mg/dL (ref 8–23)
CO2: 28 mmol/L (ref 22–32)
Calcium: 9.4 mg/dL (ref 8.9–10.3)
Chloride: 107 mmol/L (ref 98–111)
Creatinine: 0.92 mg/dL (ref 0.44–1.00)
GFR, Estimated: >60 mL/min (ref >60)
Glucose, Bld: 98 mg/dL (ref 70–99)
Potassium: 3.2 mmol/L — ABNORMAL LOW (ref 3.5–5.1)
Sodium: 140 mmol/L (ref 135–145)
Total Bilirubin: 0.8 mg/dL (ref 0.0–1.2)
Total Protein: 6.3 g/dL — ABNORMAL LOW (ref 6.5–8.1)

## 2023-12-03 LAB — CBC WITH DIFFERENTIAL (CANCER CENTER ONLY)
Abs Immature Granulocytes: 0.01 10*3/uL (ref 0.00–0.07)
Basophils Absolute: 0 10*3/uL (ref 0.0–0.1)
Basophils Relative: 0 %
Eosinophils Absolute: 0.1 10*3/uL (ref 0.0–0.5)
Eosinophils Relative: 3 %
HCT: 34.8 % — ABNORMAL LOW (ref 36.0–46.0)
Hemoglobin: 12.2 g/dL (ref 12.0–15.0)
Immature Granulocytes: 0 %
Lymphocytes Relative: 8 %
Lymphs Abs: 0.3 10*3/uL — ABNORMAL LOW (ref 0.7–4.0)
MCH: 32.2 pg (ref 26.0–34.0)
MCHC: 35.1 g/dL (ref 30.0–36.0)
MCV: 91.8 fL (ref 80.0–100.0)
Monocytes Absolute: 0.3 10*3/uL (ref 0.1–1.0)
Monocytes Relative: 9 %
Neutro Abs: 2.5 10*3/uL (ref 1.7–7.7)
Neutrophils Relative %: 80 %
Platelet Count: 116 10*3/uL — ABNORMAL LOW (ref 150–400)
RBC: 3.79 MIL/uL — ABNORMAL LOW (ref 3.87–5.11)
RDW: 17.5 % — ABNORMAL HIGH (ref 11.5–15.5)
WBC Count: 3.2 10*3/uL — ABNORMAL LOW (ref 4.0–10.5)
nRBC: 0 % (ref 0.0–0.2)

## 2023-12-03 LAB — CEA (ACCESS): CEA (CHCC): 2.9 ng/mL (ref 0.00–5.00)

## 2023-12-03 MED ORDER — POTASSIUM CHLORIDE CRYS ER 10 MEQ PO TBCR
10.0000 meq | EXTENDED_RELEASE_TABLET | Freq: Every day | ORAL | 1 refills | Status: DC
  Filled 2023-12-03: qty 30, 30d supply, fill #0

## 2023-12-04 ENCOUNTER — Other Ambulatory Visit (HOSPITAL_COMMUNITY): Payer: Self-pay

## 2023-12-04 ENCOUNTER — Other Ambulatory Visit: Payer: Self-pay

## 2023-12-04 ENCOUNTER — Ambulatory Visit
Admission: RE | Admit: 2023-12-04 | Discharge: 2023-12-04 | Disposition: A | Discharge status: Home / Self Care | Payer: PPO | Source: Ambulatory Visit | Attending: Radiation Oncology | Admitting: Radiation Oncology

## 2023-12-04 DIAGNOSIS — Z510 Encounter for antineoplastic radiation therapy: Secondary | ICD-10-CM | POA: Diagnosis not present

## 2023-12-04 LAB — RAD ONC ARIA SESSION SUMMARY
Course Elapsed Days: 31
Plan Fractions Treated to Date: 22
Plan Prescribed Dose Per Fraction: 1.8 Gy
Plan Total Fractions Prescribed: 25
Plan Total Prescribed Dose: 45 Gy
Reference Point Dosage Given to Date: 39.6 Gy
Reference Point Session Dosage Given: 1.8 Gy
Session Number: 22

## 2023-12-05 ENCOUNTER — Ambulatory Visit: Payer: PPO

## 2023-12-05 ENCOUNTER — Telehealth: Payer: Self-pay | Admitting: Radiation Oncology

## 2023-12-05 ENCOUNTER — Other Ambulatory Visit (HOSPITAL_COMMUNITY): Payer: Self-pay

## 2023-12-05 NOTE — Telephone Encounter (Signed)
Pt called to cx today's tx appt due to illness. Call transferred to RTT Support.

## 2023-12-08 ENCOUNTER — Ambulatory Visit: Payer: PPO

## 2023-12-09 ENCOUNTER — Ambulatory Visit
Admission: RE | Admit: 2023-12-09 | Discharge: 2023-12-09 | Disposition: A | Discharge status: Home / Self Care | Payer: PPO | Source: Ambulatory Visit | Attending: Radiation Oncology | Admitting: Radiation Oncology

## 2023-12-09 ENCOUNTER — Other Ambulatory Visit: Payer: Self-pay

## 2023-12-09 DIAGNOSIS — Z510 Encounter for antineoplastic radiation therapy: Secondary | ICD-10-CM | POA: Diagnosis not present

## 2023-12-09 LAB — RAD ONC ARIA SESSION SUMMARY
Course Elapsed Days: 36
Plan Fractions Treated to Date: 23
Plan Prescribed Dose Per Fraction: 1.8 Gy
Plan Total Fractions Prescribed: 25
Plan Total Prescribed Dose: 45 Gy
Reference Point Dosage Given to Date: 41.4 Gy
Reference Point Session Dosage Given: 1.8 Gy
Session Number: 23

## 2023-12-10 ENCOUNTER — Other Ambulatory Visit: Payer: Self-pay

## 2023-12-10 ENCOUNTER — Ambulatory Visit: Payer: PPO | Admitting: Radiation Oncology

## 2023-12-10 ENCOUNTER — Ambulatory Visit
Admission: RE | Admit: 2023-12-10 | Discharge: 2023-12-10 | Disposition: A | Discharge status: Home / Self Care | Payer: PPO | Source: Ambulatory Visit | Attending: Radiation Oncology | Admitting: Radiation Oncology

## 2023-12-10 DIAGNOSIS — Z510 Encounter for antineoplastic radiation therapy: Secondary | ICD-10-CM | POA: Diagnosis not present

## 2023-12-10 LAB — RAD ONC ARIA SESSION SUMMARY
Course Elapsed Days: 37
Plan Fractions Treated to Date: 24
Plan Prescribed Dose Per Fraction: 1.8 Gy
Plan Total Fractions Prescribed: 25
Plan Total Prescribed Dose: 45 Gy
Reference Point Dosage Given to Date: 43.2 Gy
Reference Point Session Dosage Given: 1.8 Gy
Session Number: 24

## 2023-12-11 ENCOUNTER — Ambulatory Visit
Admission: RE | Admit: 2023-12-11 | Discharge: 2023-12-11 | Disposition: A | Discharge status: Home / Self Care | Payer: PPO | Source: Ambulatory Visit | Attending: Radiation Oncology | Admitting: Radiation Oncology

## 2023-12-11 ENCOUNTER — Other Ambulatory Visit: Payer: Self-pay

## 2023-12-11 ENCOUNTER — Ambulatory Visit: Payer: PPO | Admitting: Radiation Oncology

## 2023-12-11 ENCOUNTER — Ambulatory Visit: Payer: PPO

## 2023-12-11 DIAGNOSIS — Z510 Encounter for antineoplastic radiation therapy: Secondary | ICD-10-CM | POA: Diagnosis not present

## 2023-12-11 LAB — RAD ONC ARIA SESSION SUMMARY
Course Elapsed Days: 38
Plan Fractions Treated to Date: 25
Plan Prescribed Dose Per Fraction: 1.8 Gy
Plan Total Fractions Prescribed: 25
Plan Total Prescribed Dose: 45 Gy
Reference Point Dosage Given to Date: 45 Gy
Reference Point Session Dosage Given: 1.8 Gy
Session Number: 25

## 2023-12-12 ENCOUNTER — Ambulatory Visit: Payer: PPO

## 2023-12-12 ENCOUNTER — Ambulatory Visit
Admission: RE | Admit: 2023-12-12 | Discharge: 2023-12-12 | Disposition: A | Discharge status: Home / Self Care | Payer: PPO | Source: Ambulatory Visit | Attending: Radiation Oncology | Admitting: Radiation Oncology

## 2023-12-12 ENCOUNTER — Other Ambulatory Visit: Payer: Self-pay

## 2023-12-12 DIAGNOSIS — Z510 Encounter for antineoplastic radiation therapy: Secondary | ICD-10-CM | POA: Diagnosis not present

## 2023-12-12 LAB — RAD ONC ARIA SESSION SUMMARY
Course Elapsed Days: 39
Plan Fractions Treated to Date: 1
Plan Prescribed Dose Per Fraction: 1.8 Gy
Plan Total Fractions Prescribed: 3
Plan Total Prescribed Dose: 5.4 Gy
Reference Point Dosage Given to Date: 1.8 Gy
Reference Point Session Dosage Given: 1.8 Gy
Session Number: 26

## 2023-12-15 ENCOUNTER — Other Ambulatory Visit: Payer: Self-pay

## 2023-12-15 ENCOUNTER — Ambulatory Visit: Payer: PPO

## 2023-12-15 ENCOUNTER — Ambulatory Visit
Admission: RE | Admit: 2023-12-15 | Discharge: 2023-12-15 | Disposition: A | Discharge status: Home / Self Care | Payer: PPO | Source: Ambulatory Visit | Attending: Radiation Oncology | Admitting: Radiation Oncology

## 2023-12-15 DIAGNOSIS — Z510 Encounter for antineoplastic radiation therapy: Secondary | ICD-10-CM | POA: Diagnosis not present

## 2023-12-15 LAB — RAD ONC ARIA SESSION SUMMARY
Course Elapsed Days: 42
Plan Fractions Treated to Date: 1
Plan Prescribed Dose Per Fraction: 1.8 Gy
Plan Total Fractions Prescribed: 4
Plan Total Prescribed Dose: 7.2 Gy
Reference Point Dosage Given to Date: 3.6 Gy
Reference Point Session Dosage Given: 1.8 Gy
Session Number: 27

## 2023-12-16 ENCOUNTER — Other Ambulatory Visit: Payer: Self-pay

## 2023-12-16 ENCOUNTER — Ambulatory Visit
Admission: RE | Admit: 2023-12-16 | Discharge: 2023-12-16 | Discharge status: Home / Self Care | Payer: PPO | Source: Ambulatory Visit | Attending: Radiation Oncology

## 2023-12-16 ENCOUNTER — Ambulatory Visit: Payer: PPO

## 2023-12-16 DIAGNOSIS — Z510 Encounter for antineoplastic radiation therapy: Secondary | ICD-10-CM | POA: Diagnosis not present

## 2023-12-16 LAB — RAD ONC ARIA SESSION SUMMARY
Course Elapsed Days: 43
Plan Fractions Treated to Date: 2
Plan Prescribed Dose Per Fraction: 1.8 Gy
Plan Total Fractions Prescribed: 4
Plan Total Prescribed Dose: 7.2 Gy
Reference Point Dosage Given to Date: 5.4 Gy
Reference Point Session Dosage Given: 1.8 Gy
Session Number: 28

## 2023-12-16 NOTE — Assessment & Plan Note (Signed)
-  [id number], stage IIIB, G2 -presented with rectal bleeding.  I reviewed her recent colonoscopy findings, biopsy results, and her staging CT images.  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  -pelvic MRI 10/24/2023 showed  -pt was seen by surgeon Dr. Maisie Fus and rad/onc Dr. Mitzi Hansen -she agrees with TNT, and started concurrent chemoRT on 11/03/2023, will complete on 12/17/2022, follow by chemo FOLFOX for 4 months

## 2023-12-17 ENCOUNTER — Encounter: Payer: Self-pay | Admitting: Hematology

## 2023-12-17 ENCOUNTER — Ambulatory Visit
Admission: RE | Admit: 2023-12-17 | Discharge: 2023-12-17 | Disposition: A | Discharge status: Home / Self Care | Payer: PPO | Source: Ambulatory Visit | Attending: Radiation Oncology | Admitting: Radiation Oncology

## 2023-12-17 ENCOUNTER — Ambulatory Visit: Payer: Self-pay | Admitting: General Surgery

## 2023-12-17 ENCOUNTER — Inpatient Hospital Stay: Payer: PPO | Admitting: Hematology

## 2023-12-17 ENCOUNTER — Inpatient Hospital Stay: Payer: PPO

## 2023-12-17 ENCOUNTER — Other Ambulatory Visit: Payer: Self-pay

## 2023-12-17 VITALS — BP 149/65 | HR 60 | Temp 97.8°F | Resp 16 | Wt 170.8 lb

## 2023-12-17 DIAGNOSIS — C20 Malignant neoplasm of rectum: Secondary | ICD-10-CM

## 2023-12-17 DIAGNOSIS — Z510 Encounter for antineoplastic radiation therapy: Secondary | ICD-10-CM | POA: Diagnosis not present

## 2023-12-17 LAB — RAD ONC ARIA SESSION SUMMARY
Course Elapsed Days: 44
Plan Fractions Treated to Date: 3
Plan Prescribed Dose Per Fraction: 1.8 Gy
Plan Total Fractions Prescribed: 4
Plan Total Prescribed Dose: 7.2 Gy
Reference Point Dosage Given to Date: 7.2 Gy
Reference Point Session Dosage Given: 1.8 Gy
Session Number: 29

## 2023-12-17 LAB — CMP (CANCER CENTER ONLY)
ALT: 8 U/L (ref 0–44)
AST: 20 U/L (ref 15–41)
Albumin: 3.9 g/dL (ref 3.5–5.0)
Alkaline Phosphatase: 111 U/L (ref 38–126)
Anion gap: 7 (ref 5–15)
BUN: 11 mg/dL (ref 8–23)
CO2: 28 mmol/L (ref 22–32)
Calcium: 9.5 mg/dL (ref 8.9–10.3)
Chloride: 109 mmol/L (ref 98–111)
Creatinine: 0.86 mg/dL (ref 0.44–1.00)
GFR, Estimated: >60 mL/min (ref >60)
Glucose, Bld: 104 mg/dL — ABNORMAL HIGH (ref 70–99)
Potassium: 3.3 mmol/L — ABNORMAL LOW (ref 3.5–5.1)
Sodium: 144 mmol/L (ref 135–145)
Total Bilirubin: 1 mg/dL (ref 0.0–1.2)
Total Protein: 6.4 g/dL — ABNORMAL LOW (ref 6.5–8.1)

## 2023-12-17 LAB — CBC WITH DIFFERENTIAL (CANCER CENTER ONLY)
Abs Immature Granulocytes: 0.01 10*3/uL (ref 0.00–0.07)
Basophils Absolute: 0 10*3/uL (ref 0.0–0.1)
Basophils Relative: 0 %
Eosinophils Absolute: 0.1 10*3/uL (ref 0.0–0.5)
Eosinophils Relative: 3 %
HCT: 34.3 % — ABNORMAL LOW (ref 36.0–46.0)
Hemoglobin: 11.7 g/dL — ABNORMAL LOW (ref 12.0–15.0)
Immature Granulocytes: 0 %
Lymphocytes Relative: 12 %
Lymphs Abs: 0.4 10*3/uL — ABNORMAL LOW (ref 0.7–4.0)
MCH: 32.4 pg (ref 26.0–34.0)
MCHC: 34.1 g/dL (ref 30.0–36.0)
MCV: 95 fL (ref 80.0–100.0)
Monocytes Absolute: 0.3 10*3/uL (ref 0.1–1.0)
Monocytes Relative: 10 %
Neutro Abs: 2.2 10*3/uL (ref 1.7–7.7)
Neutrophils Relative %: 75 %
Platelet Count: 129 10*3/uL — ABNORMAL LOW (ref 150–400)
RBC: 3.61 MIL/uL — ABNORMAL LOW (ref 3.87–5.11)
RDW: 18.6 % — ABNORMAL HIGH (ref 11.5–15.5)
WBC Count: 3 10*3/uL — ABNORMAL LOW (ref 4.0–10.5)
nRBC: 0 % (ref 0.0–0.2)

## 2023-12-17 MED ORDER — PROCHLORPERAZINE MALEATE 10 MG PO TABS: 10.0000 mg | ORAL_TABLET | Freq: Four times a day (QID) | ORAL | 1 refills | Status: DC | PRN

## 2023-12-17 MED ORDER — LIDOCAINE-PRILOCAINE 2.5-2.5 % EX CREA: TOPICAL_CREAM | CUTANEOUS | 3 refills | Status: AC

## 2023-12-17 MED ORDER — ONDANSETRON HCL 8 MG PO TABS: 8.0000 mg | ORAL_TABLET | Freq: Three times a day (TID) | ORAL | 1 refills | Status: DC | PRN

## 2023-12-17 NOTE — Progress Notes (Signed)
START ON PATHWAY REGIMEN - Colorectal     A cycle is every 14 days:     Oxaliplatin      Leucovorin      Fluorouracil      Fluorouracil   **Always confirm dose/schedule in your pharmacy ordering system**  Patient Characteristics: Preoperative or Nonsurgical Candidate, M0 (Clinical Staging), Rectal, cT2, cN1 or cT3, cN0-1, and Not a Candidate for Sphincter-sparing Surgery or Neoadjuvant ChemoRT Preferred Tumor Location: Rectal Therapeutic Status: Preoperative or Nonsurgical Candidate, M0 (Clinical Staging) AJCC T Category: cT3 AJCC N Category: cN1 AJCC M Category: cM0 AJCC 8 Stage Grouping: IIIB Intent of Therapy: Curative Intent, Discussed with Patient

## 2023-12-17 NOTE — Progress Notes (Signed)
[facility]   [phone] [phone]   Clinic Follow up Note   Patient Care Team: Irven Coe, MD as PCP - General (Family Medicine) Malachy Mood, MD as Consulting Physician (Hematology and Oncology)  Date of Service:  12/17/2023  CHIEF COMPLAINT: f/u of rectal cancer  CURRENT THERAPY:  Concurrent chemoradiation with capecitabine  Oncology History   Adenocarcinoma of rectum (HCC) -cT3bN1M0, stage IIIB, G2 -presented with rectal bleeding.  I reviewed her recent colonoscopy findings, biopsy results, and her staging CT images.  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  -pelvic MRI 10/24/2023 showed  -pt was seen by surgeon Dr. Maisie Fus and rad/onc Dr. Mitzi Hansen -she agrees with TNT, and started concurrent chemoRT on 11/03/2023, will complete on 12/17/2022, follow by chemo FOLFOX for 4 months     Assessment and Plan    Rectal Cancer Stage III rectal cancer. Completing chemoradiation therapy tomorrow. No significant rectal discomfort or pain. Recent episode of explosive diarrhea likely due to dietary indiscretion. Cancer not fully eradicated, necessitating further treatment. Patient values quality of life and is hesitant about aggressive treatments but is willing to try intravenous chemotherapy with dose reduction to minimize side effects. Discussed that intravenous chemotherapy FOLFOX is more intensive than chemoradiation but increases the chance of cure. Explained regimen includes two drugs: one via a two-hour infusion at the clinic and the second via a home pump for two days. Emphasized importance of completing eight treatments over four months for potential curative outcome. Patient prefers to avoid surgery and extreme measures impacting quality of life. - Complete chemoradiation therapy tomorrow - Discontinue chemo pill after tomorrow night - Schedule intravenous chemotherapy FOLFOX to start on February 25th, 2025 - Plan for  eight treatments over four months, with treatments every two weeks - Reduce chemotherapy dose to minimize side effects - Schedule port placement with Dr. Maisie Fus within three to four weeks - Prescribe nausea medication and cream for port site - Follow up in four weeks  Goals of Care Patient prioritizes quality of life over aggressive treatment. Willing to try intravenous chemotherapy but prefers to avoid surgery and extreme measures impacting quality of life. Expressed desire not to be kept alive in a severely debilitated state. - Discuss and document patient's preferences for quality of life and treatment goals - Ensure patient and family are aware of the treatment plan and potential outcomes  Plan -She will complete chemo capecitabine and radiation tomorrow, then take a month break - Schedule first two chemotherapy treatments FOLFOX and chemo class before  - Ensure Dr. Maurine Minister office contacts patient for port placement - Follow up in four weeks before first cycle FOLFOX         SUMMARY OF ONCOLOGIC HISTORY: Oncology History  Adenocarcinoma of rectum (HCC)  09/24/2023 Imaging   CT chest abdomen and pelvis with contrast  IMPRESSION: 1. Rectal primary with nodal metastasis in the mesorectum and sigmoid mesocolon. 2. No distant metastasis. 3. Chronic left-sided hydronephrosis with overlying cortical thinning and decreased/absent left renal function. Favor chronic ureteropelvic junction obstruction, given absence of hydroureter. 4. 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 5. Incidental findings, including: Coronary artery atherosclerosis. Aortic Atherosclerosis (ICD10-I70.0). Cholelithiasis. Bilateral nephrolithiasis.   10/02/2023 Initial Diagnosis   Adenocarcinoma of rectum (HCC)   10/24/2023 Cancer Staging   Staging form: Colon and Rectum, AJCC 8th Edition - Clinical stage from 10/24/2023: Stage IIIB (cT3, cN1, cM0) - Signed  by Mosetta Putt,  Terrace Arabia, MD on 10/31/2023 Histologic grade (G): G2 Histologic grading system: 4 grade system   10/29/2023 Imaging   Pelvic MRI FINDINGS: TUMOR LOCATION  Tumor distance from Anal Verge/Skin surface: 11.1 cm.  Tumor distance to Internal Anal sphincter: 7.4 cm.  TUMOR DESCRIPTION  Circumferential extent: 9 to 6 o'clock position, 75% of the total circumference, along the superior aspect.   Tumor Size and volume: 4.6 cm craniocaudal. Up to 1.9 cm in thickness.   T - CATEGORY   Extension through Muscularis Propria: Yes 1-2mm=T3b (approximately 1.6-1.8 mm.)   Shortest Distance of any tumor/node from Mesorectal fascia: 1.4 cm.   Extramural Vascular Invasion/Tumor Thrombus: No.   Invasion of Anterior Peritoneal Reflection: No.  Involvement of Adjacent Organs or Pelvic Sidewall: No.   Levator Ani Involvement: No.   N - CATEGORY   Mesorectal Lymph Nodes >=100mm: 1-3=N1   Extra-mesorectal Lymphadenopathy: No   Other: There are multiple sigmoid diverticula without imaging signs diverticulitis. Urinary bladder is decompressed. Surgically absent uterus. No large adnexal mass seen.   IMPRESSION: Rectal adenocarcinoma T stage: T3b   Rectal adenocarcinoma N stage: N1   Distance from tumor to the internal anal sphincter is 7.4 cm.       01/13/2024 -  Chemotherapy   Patient is on Treatment Plan : COLORECTAL FOLFOX q14d x 4 months        Discussed the use of AI scribe software for clinical note transcription with the patient, who gave verbal consent to proceed.  History of Present Illness   The patient, an 83 year old female with a history of rectal cancer, presents for a follow-up visit. She reports that she has been feeling "okay" and that "today seems to be a good day." However, she mentions that she had a recent episode of "explosive diarrhea" which she attributes to eating something she shouldn't have. Despite this, she reports that she feels physically fine and does not experience any pain  or discomfort.  The patient is nearing the end of her radiation and chemotherapy treatment, which she describes as "not too bad." She expresses a fondness for the radiation team and mentions that she will miss them after her treatment ends. She also discusses her concerns about the quality of life and her unwillingness to undergo surgery. She shares a personal anecdote about a sick patient she encountered in the hospital and expresses her desire not to be in a similar situation.  The patient also mentions her plans to visit her grandchildren in [place] and asks about the possibility of scheduling her treatments around this trip. She expresses a willingness to try the intravenous chemotherapy as recommended by the doctor, despite her concerns about the potential side effects.         All other systems were reviewed with the patient and are negative.  MEDICAL HISTORY:  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   Depression    Dysthymia    FHx: cardiovascular disease    History of blood transfusion as child   History of kidney stones    Hyperlipidemia    Hypertension    LBBB (left bundle branch block) 2014   on ekg   Obesity    Pneumonia    hx of as a child   Vitamin D deficiency     SURGICAL HISTORY: Past  Surgical History:  Procedure Laterality Date   ABDOMINAL HYSTERECTOMY  1986   partial   APPENDECTOMY  1986   with hysterectomy   CATARACT EXTRACTION Bilateral 2016   CYSTOSCOPY/URETEROSCOPY/HOLMIUM LASER/STENT PLACEMENT Left 02/09/2021   Procedure: LEFT URETEROSCOPY/ LASER LITHOTRIPSY  AND  STENT PLACEMENT,ENDOPYLOTOMY;  Surgeon: Crist Fat, MD;  Location: [facility];  Service: Urology;  Laterality: Left;   CYSTOSCOPY/URETEROSCOPY/HOLMIUM LASER/STENT PLACEMENT Left 03/15/2021   Procedure: CYSTOSCOPY, LEFT URETEROSCOPY, HOLMIUM LASER  ENDOPYELOTOMY, URETERAL STENT PLACEMENT;  Surgeon: Crist Fat, MD;  Location: [facility];  Service: Urology;  Laterality: Left;   EYE SURGERY  2016   bil cataracts   HOLMIUM LASER APPLICATION Left 02/09/2021   Procedure: HOLMIUM LASER APPLICATION;  Surgeon: Crist Fat, MD;  Location: [facility];  Service: Urology;  Laterality: Left;   KNEE ARTHROSCOPY  1995   left   NEPHROLITHOTOMY Left 03/23/2020   Procedure: LEFT NEPHROLITHOTOMY PERCUTANEOUS WITH SURGEON ACCESS;  Surgeon: Crist Fat, MD;  Location: [facility];  Service: Urology;  Laterality: Left;   TONSILLECTOMY  1945   adenoids removed   TOTAL HIP ARTHROPLASTY Left 06/03/2023   Procedure: TOTAL HIP ARTHROPLASTY ANTERIOR APPROACH;  Surgeon: Durene Romans, MD;  Location: [facility];  Service: Orthopedics;  Laterality: Left;   TOTAL KNEE ARTHROPLASTY Right 06/06/2020   Procedure: TOTAL KNEE ARTHROPLASTY;  Surgeon: Durene Romans, MD;  Location: [facility];  Service: Orthopedics;  Laterality: Right;  70 mins   TOTAL KNEE ARTHROPLASTY Left 07/17/2021   Procedure: TOTAL KNEE ARTHROPLASTY;  Surgeon: Durene Romans, MD;  Location: [facility];  Service: Orthopedics;  Laterality: Left;   TUBAL LIGATION  1979    I have reviewed the social history and family history with the patient and they are unchanged from previous note.  ALLERGIES:  is allergic to statins and sulfa antibiotics.  MEDICATIONS:  Current Outpatient Medications  Medication Sig Dispense Refill   albuterol (PROVENTIL HFA;VENTOLIN HFA) 108 (90 BASE) MCG/ACT inhaler Inhale 2 puffs into the lungs every 6 (six) hours as needed for wheezing or shortness of breath. 1 Inhaler 0   bacitracin-polymyxin b (POLYSPORIN) ointment Apply 1 application topically daily as needed (wound care).     bisoprolol-hydrochlorothiazide (ZIAC) 5-6.25 MG tablet TAKE 1 TABLET BY MOUTH DAILY. (Patient taking differently: Take 1 tablet by mouth at bedtime.) 90 tablet 0   diclofenac Sodium  (VOLTAREN) 1 % GEL Apply 1 Application topically 2 (two) times daily.     lidocaine-prilocaine (EMLA) cream Apply to affected area once 30 g 3   ondansetron (ZOFRAN) 8 MG tablet Take 1 tablet (8 mg total) by mouth every 8 (eight) hours as needed for nausea or vomiting. Start on the third day after chemotherapy. 30 tablet 1   potassium chloride (KLOR-CON M) 10 MEQ tablet Take 1 tablet (10 mEq total) by mouth daily. 30 tablet 1   pravastatin (PRAVACHOL) 40 MG tablet TAKE 1 TABLET EVERY DAY (Patient taking differently: Take 40 mg by mouth every evening.) 90 tablet 0   prochlorperazine (COMPAZINE) 10 MG tablet Take 1 tablet (10 mg total) by mouth every 6 (six) hours as needed for nausea or vomiting. 30 tablet 1   Propylene Glycol, PF, (SYSTANE COMPLETE PF) 0.6 % SOLN Place 1 drop into both eyes daily.     sertraline (ZOLOFT) 100 MG tablet TAKE 1 TABLET EVERY DAY (Patient taking differently: Take 150 mg by mouth at bedtime.) 90 tablet 0   traZODone (DESYREL) 50 MG tablet TAKE ONE  TABLET BY MOUTH ONCE a DAY at bedtime AS NEEDED     No current facility-administered medications for this visit.    PHYSICAL EXAMINATION: ECOG PERFORMANCE STATUS: 1 - Symptomatic but completely ambulatory  Vitals:   12/17/23 1346  BP: (!) 149/65  Pulse: 60  Resp: 16  Temp: 97.8 F (36.6 C)  SpO2: 98%   Wt Readings from Last 3 Encounters:  12/17/23 170 lb 12.8 oz (77.5 kg)  12/03/23 176 lb 8 oz (80.1 kg)  11/18/23 173 lb 8 oz (78.7 kg)     GENERAL:alert, no distress and comfortable SKIN: skin color, texture, turgor are normal, no rashes or significant lesions EYES: normal, Conjunctiva are pink and non-injected, sclera clear NECK: supple, thyroid normal size, non-tender, without nodularity LYMPH:  no palpable lymphadenopathy in the cervical, axillary  LUNGS: clear to auscultation and percussion with normal breathing effort HEART: regular rate & rhythm and no murmurs and no lower extremity edema ABDOMEN:abdomen  soft, non-tender and normal bowel sounds Musculoskeletal:no cyanosis of digits and no clubbing  NEURO: alert & oriented x 3 with fluent speech, no focal motor/sensory deficits   LABORATORY DATA:  I have reviewed the data as listed    Latest Ref Rng & Units 12/17/2023    1:20 PM 12/03/2023   10:11 AM 11/18/2023   10:35 AM  CBC  WBC 4.0 - 10.5 K/uL 3.0  3.2  4.3   Hemoglobin 12.0 - 15.0 g/dL 78.2  95.6  21.3   Hematocrit 36.0 - 46.0 % 34.3  34.8  36.1   Platelets 150 - 400 K/uL 129  116  155         Latest Ref Rng & Units 12/17/2023    1:20 PM 12/03/2023   10:11 AM 11/18/2023   10:35 AM  CMP  Glucose 70 - 99 mg/dL 086  98  578   BUN 8 - 23 mg/dL 11  11  11    Creatinine 0.44 - 1.00 mg/dL 4.69  6.29  5.28   Sodium 135 - 145 mmol/L 144  140  142   Potassium 3.5 - 5.1 mmol/L 3.3  3.2  3.5   Chloride 98 - 111 mmol/L 109  107  105   CO2 22 - 32 mmol/L 28  28  30    Calcium 8.9 - 10.3 mg/dL 9.5  9.4  9.8   Total Protein 6.5 - 8.1 g/dL 6.4  6.3  6.8   Total Bilirubin 0.0 - 1.2 mg/dL 1.0  0.8  0.8   Alkaline Phos 38 - 126 U/L 111  99  107   AST 15 - 41 U/L 20  16  17    ALT 0 - 44 U/L 8  8  9        RADIOGRAPHIC STUDIES: I have personally reviewed the radiological images as listed and agreed with the findings in the report. No results found.    Orders Placed This Encounter  Procedures   Consent Attestation for Oncology Treatment    The patient is informed of risks, benefits, side-effects of the prescribed oncology treatment. Potential short term and long term side effects and response rates discussed. After a long discussion, the patient made informed decision to proceed.:   Yes   CBC with Differential (Cancer Center Only)    Standing Status:   Future    Expected Date:   01/13/2024    Expiration Date:   01/12/2025   CMP (Cancer Center only)    Standing Status:   Future  Expected Date:   01/13/2024    Expiration Date:   01/12/2025   CBC with Differential (Cancer Center Only)     Standing Status:   Future    Expected Date:   01/27/2024    Expiration Date:   01/26/2025   CMP (Cancer Center only)    Standing Status:   Future    Expected Date:   01/27/2024    Expiration Date:   01/26/2025   [facility] PHYSICIAN COMMUNICATION 1    0 Number of doses of oxaliplatin received at [facility] or outside facility. signature of Provider. If patient has received greater than 5 doses of oxaliplatin, the following pre-medications should be ordered: dexamethasone, diphenhydramine, and formulary histamine H2 antagonist. If patient cannot tolerate oral histamine H2 antagonist, IV may be given.   All questions were answered. The patient knows to call the clinic with any problems, questions or concerns. No barriers to learning was detected. The total time spent in the appointment was 40 minutes.     Malachy Mood, MD 12/17/2023

## 2023-12-18 ENCOUNTER — Other Ambulatory Visit: Payer: Self-pay

## 2023-12-18 ENCOUNTER — Ambulatory Visit
Admission: RE | Admit: 2023-12-18 | Discharge: 2023-12-18 | Disposition: A | Discharge status: Home / Self Care | Payer: PPO | Source: Ambulatory Visit | Attending: Radiation Oncology | Admitting: Radiation Oncology

## 2023-12-18 DIAGNOSIS — Z510 Encounter for antineoplastic radiation therapy: Secondary | ICD-10-CM | POA: Diagnosis not present

## 2023-12-18 LAB — RAD ONC ARIA SESSION SUMMARY
Course Elapsed Days: 45
Plan Fractions Treated to Date: 4
Plan Prescribed Dose Per Fraction: 1.8 Gy
Plan Total Fractions Prescribed: 4
Plan Total Prescribed Dose: 7.2 Gy
Reference Point Dosage Given to Date: 9 Gy
Reference Point Session Dosage Given: 1.8 Gy
Session Number: 30

## 2023-12-19 NOTE — Radiation Completion Notes (Signed)
Patient Name: Samantha Jenkins, Samantha Jenkins MRN: [id number] Date of Birth: 21-Feb-1941 Referring Physician: Malachy Mood, M.D. Date of Service: 2023-12-19 Radiation Oncologist: Dorothy Puffer, M.D. [facility] [place]                             RADIATION ONCOLOGY END OF TREATMENT NOTE     Diagnosis: C20 Malignant neoplasm of rectum Staging on 2023-10-24: Adenocarcinoma of rectum (HCC) T=cT3, N=cN1, M=cM0 Intent: Curative     ==========DELIVERED PLANS==========  First Treatment Date: 2023-11-03 Last Treatment Date: 2023-12-18   Plan Name: Rectum Site: Rectum Technique: 3D Mode: Photon Dose Per Fraction: 1.8 Gy Prescribed Dose (Delivered / Prescribed): 45 Gy / 45 Gy Prescribed Fxs (Delivered / Prescribed): 25 / 25   Plan Name: Rectum_Bst Site: Rectum Technique: 3D Mode: Photon Dose Per Fraction: 1.8 Gy Prescribed Dose (Delivered / Prescribed): 1.8 Gy / 1.8 Gy Prescribed Fxs (Delivered / Prescribed): 1 / 1   Plan Name: Rectum_Bst:1 Site: Rectum Technique: 3D Mode: Photon Dose Per Fraction: 1.8 Gy Prescribed Dose (Delivered / Prescribed): 7.2 Gy / 7.2 Gy Prescribed Fxs (Delivered / Prescribed): 4 / 4     ==========ON TREATMENT VISIT DATES========== 2023-11-07, 2023-11-14, 2023-11-21, 2023-11-28, 2023-12-09, 2023-12-12     ==========UPCOMING VISITS==========       ==========APPENDIX - ON TREATMENT VISIT NOTES==========   See weekly On Treatment Notes in Epic for details in the Media tab (listed as Progress notes on the On Treatment Visit Dates listed above).

## 2023-12-23 NOTE — Progress Notes (Addendum)
 Anesthesia Review:  PCP: Cheryle Frees  Cardiologist : none  Chest x-ray : EKG : 05/21/23  CT Chest- 10/02/23  Echo : Stress test: Cardiac Cath :  Activity level: can do a flight of stairs without difficutly  Sleep Study/ CPAP : none  Fasting Blood Sugar :      / Checks Blood Sugar -- times a day:   Blood Thinner/ Instructions /Last Dose: ASA / Instructions/ Last Dose :    CBC/Diff and CMP done 12/17/23 PT came in for preop appt on 12/25/23.    Completed radiation treatment on 12/18/23 per pt.

## 2023-12-23 NOTE — Patient Instructions (Signed)
SURGICAL WAITING ROOM VISITATION  Patients having surgery or a procedure may have no more than 2 support people in the waiting area - these visitors may rotate.    Children under the age of 79 must have an adult with them who is not the patient.  Due to an increase in RSV and influenza rates and associated hospitalizations, children ages 40 and under may not visit patients in [facility] hospitals.  Visitors with respiratory illnesses are discouraged from visiting and should remain at home.  If the patient needs to stay at the hospital during part of their recovery, the visitor guidelines for inpatient rooms apply. Pre-op nurse will coordinate an appropriate time for 1 support person to accompany patient in pre-op.  This support person may not rotate.    Please refer to the [facility] website for the visitor guidelines for Inpatients (after your surgery is over and you are in a regular room).       Your procedure is scheduled on 01/07/2024     Report to [facility] Main Entrance    Report to admitting at   115PM    Call this number if you have problems the morning of surgery [phone]   Do not eat food :After Midnight.   After Midnight you may have the following liquids until __ 1200 NOON ____  DAY OF SURGERY  Water Non-Citrus Juices (without pulp, NO RED-Apple, White grape, White cranberry) Black Coffee (NO MILK/CREAM OR CREAMERS, sugar ok)  Clear Tea (NO MILK/CREAM OR CREAMERS, sugar ok) regular and decaf                             Plain Jell-O (NO RED)                                           Fruit ices (not with fruit pulp, NO RED)                                     Popsicles (NO RED)                                                               Sports drinks like             If you have questions, please contact your surgeon's office.       Oral Hygiene is also important to reduce your risk of infection.                                    Remember -  BRUSH YOUR TEETH THE MORNING OF SURGERY WITH YOUR REGULAR TOOTHPASTE  DENTURES WILL BE REMOVED PRIOR TO SURGERY PLEASE DO NOT APPLY "Poly grip" OR ADHESIVES!!!   Do NOT smoke after Midnight   Stop all vitamins and herbal supplements 7 days before surgery.   Take these medicines the morning of surgery with A SIP OF WATER:  INHALERS AS USUAL AND BRING   DO NOT TAKE  ANY ORAL DIABETIC MEDICATIONS DAY OF YOUR SURGERY  Bring CPAP mask and tubing day of surgery.                              You may not have any metal on your body including hair pins, jewelry, and body piercing             Do not wear make-up, lotions, powders, perfumes/cologne, or deodorant  Do not wear nail polish including gel and S&S, artificial/acrylic nails, or any other type of covering on natural nails including finger and toenails. If you have artificial nails, gel coating, etc. that needs to be removed by a nail salon please have this removed prior to surgery or surgery may need to be canceled/ delayed if the surgeon/ anesthesia feels like they are unable to be safely monitored.   Do not shave  48 hours prior to surgery.               Men may shave face and neck.   Do not bring valuables to the hospital. [facility] IS NOT             RESPONSIBLE   FOR VALUABLES.   Contacts, glasses, dentures or bridgework may not be worn into surgery.   Bring small overnight bag day of surgery.   DO NOT BRING YOUR HOME MEDICATIONS TO THE HOSPITAL. PHARMACY WILL DISPENSE MEDICATIONS LISTED ON YOUR MEDICATION LIST TO YOU DURING YOUR ADMISSION IN THE HOSPITAL!    Patients discharged on the day of surgery will not be allowed to drive home.  Someone NEEDS to stay with you for the first 24 hours after anesthesia.   Special Instructions: Bring a copy of your healthcare power of attorney and living will documents the day of surgery if you haven't scanned them before.              Please read over the following fact sheets you were  given: IF YOU HAVE QUESTIONS ABOUT YOUR PRE-OP INSTRUCTIONS PLEASE CALL [phone]   If you received a COVID test during your pre-op visit  it is requested that you wear a mask when out in public, stay away from anyone that may not be feeling well and notify your surgeon if you develop symptoms. If you test positive for Covid or have been in contact with anyone that has tested positive in the last 10 days please notify you surgeon.    [facility] - Preparing for Surgery Before surgery, you can play an important role.  Because skin is not sterile, your skin needs to be as free of germs as possible.  You can reduce the number of germs on your skin by washing with CHG (chlorahexidine gluconate) soap before surgery.  CHG is an antiseptic cleaner which kills germs and bonds with the skin to continue killing germs even after washing. Please DO NOT use if you have an allergy to CHG or antibacterial soaps.  If your skin becomes reddened/irritated stop using the CHG and inform your nurse when you arrive at Short Stay. Do not shave (including legs and underarms) for at least 48 hours prior to the first CHG shower.  You may shave your face/neck. Please follow these instructions carefully:  1.  Shower with CHG Soap the night before surgery and the  morning of Surgery.  2.  If you choose to wash your hair, wash your hair first as usual with your  normal  shampoo.  3.  After you shampoo, rinse your hair and body thoroughly to remove the  shampoo.                           4.  Use CHG as you would any other liquid soap.  You can apply chg directly  to the skin and wash                       Gently with a scrungie or clean washcloth.  5.  Apply the CHG Soap to your body ONLY FROM THE NECK DOWN.   Do not use on face/ open                           Wound or open sores. Avoid contact with eyes, ears mouth and genitals (private parts).                       Wash face,  Genitals (private parts) with your normal soap.              6.  Wash thoroughly, paying special attention to the area where your surgery  will be performed.  7.  Thoroughly rinse your body with warm water from the neck down.  8.  DO NOT shower/wash with your normal soap after using and rinsing off  the CHG Soap.                9.  Pat yourself dry with a clean towel.            10.  Wear clean pajamas.            11.  Place clean sheets on your bed the night of your first shower and do not  sleep with pets. Day of Surgery : Do not apply any lotions/deodorants the morning of surgery.  Please wear clean clothes to the hospital/surgery center.  FAILURE TO FOLLOW THESE INSTRUCTIONS MAY RESULT IN THE CANCELLATION OF YOUR SURGERY PATIENT SIGNATURE_________________________________  NURSE SIGNATURE__________________________________  ________________________________________________________________________

## 2023-12-25 ENCOUNTER — Encounter (HOSPITAL_COMMUNITY): Payer: Self-pay

## 2023-12-25 ENCOUNTER — Encounter (HOSPITAL_COMMUNITY)
Admission: RE | Admit: 2023-12-25 | Discharge: 2023-12-25 | Disposition: A | Discharge status: Home / Self Care | Payer: PPO | Source: Ambulatory Visit | Attending: General Surgery | Admitting: General Surgery

## 2023-12-25 ENCOUNTER — Other Ambulatory Visit: Payer: Self-pay

## 2023-12-25 VITALS — BP 116/55 | HR 52 | Temp 98.0°F | Resp 16 | Ht 65.0 in | Wt 170.6 lb

## 2023-12-25 DIAGNOSIS — Z01818 Encounter for other preprocedural examination: Secondary | ICD-10-CM | POA: Diagnosis not present

## 2024-01-05 NOTE — Progress Notes (Signed)

## 2024-01-07 ENCOUNTER — Encounter (HOSPITAL_COMMUNITY): Payer: Self-pay | Admitting: General Surgery

## 2024-01-07 ENCOUNTER — Encounter (HOSPITAL_COMMUNITY)
Admission: RE | Disposition: A | Discharge status: Home / Self Care | Payer: Self-pay | Source: Ambulatory Visit | Attending: General Surgery

## 2024-01-07 ENCOUNTER — Ambulatory Visit (HOSPITAL_COMMUNITY): Payer: PPO

## 2024-01-07 ENCOUNTER — Ambulatory Visit (HOSPITAL_COMMUNITY): Payer: PPO | Admitting: Physician Assistant

## 2024-01-07 ENCOUNTER — Ambulatory Visit (HOSPITAL_BASED_OUTPATIENT_CLINIC_OR_DEPARTMENT_OTHER): Payer: PPO | Admitting: Anesthesiology

## 2024-01-07 ENCOUNTER — Ambulatory Visit (HOSPITAL_COMMUNITY)
Admission: RE | Admit: 2024-01-07 | Discharge: 2024-01-07 | Disposition: A | Discharge status: Home / Self Care | Payer: PPO | Source: Ambulatory Visit | Attending: General Surgery | Admitting: General Surgery

## 2024-01-07 DIAGNOSIS — Z923 Personal history of irradiation: Secondary | ICD-10-CM | POA: Insufficient documentation

## 2024-01-07 DIAGNOSIS — C20 Malignant neoplasm of rectum: Secondary | ICD-10-CM

## 2024-01-07 DIAGNOSIS — Z01818 Encounter for other preprocedural examination: Secondary | ICD-10-CM

## 2024-01-07 HISTORY — PX: PORTACATH PLACEMENT: SHX2246

## 2024-01-07 SURGERY — INSERTION, TUNNELED CENTRAL VENOUS DEVICE, WITH PORT
Anesthesia: General

## 2024-01-07 MED ORDER — CEFAZOLIN SODIUM-DEXTROSE 2-4 GM/100ML-% IV SOLN
2.0000 g | INTRAVENOUS | Status: AC
  Administered 2024-01-07: 2 g via INTRAVENOUS
  Filled 2024-01-07: qty 100

## 2024-01-07 MED ORDER — FENTANYL CITRATE (PF) 100 MCG/2ML IJ SOLN
INTRAMUSCULAR | Status: AC
  Filled 2024-01-07: qty 2

## 2024-01-07 MED ORDER — HEPARIN SOD (PORK) LOCK FLUSH 100 UNIT/ML IV SOLN
INTRAVENOUS | Status: DC | PRN
  Administered 2024-01-07: 500 [IU]

## 2024-01-07 MED ORDER — LACTATED RINGERS IV SOLN: INTRAVENOUS | Status: DC

## 2024-01-07 MED ORDER — PROPOFOL 10 MG/ML IV BOLUS
INTRAVENOUS | Status: AC
  Filled 2024-01-07: qty 20

## 2024-01-07 MED ORDER — BUPIVACAINE-EPINEPHRINE 0.25% -1:200000 IJ SOLN
INTRAMUSCULAR | Status: AC
  Filled 2024-01-07: qty 1

## 2024-01-07 MED ORDER — ONDANSETRON HCL 4 MG/2ML IJ SOLN
INTRAMUSCULAR | Status: AC
  Filled 2024-01-07: qty 2

## 2024-01-07 MED ORDER — HEPARIN SOD (PORK) LOCK FLUSH 100 UNIT/ML IV SOLN
INTRAVENOUS | Status: AC
  Filled 2024-01-07: qty 5

## 2024-01-07 MED ORDER — PROPOFOL 10 MG/ML IV BOLUS
INTRAVENOUS | Status: DC | PRN
  Administered 2024-01-07: 40 mg via INTRAVENOUS
  Administered 2024-01-07: 160 mg via INTRAVENOUS

## 2024-01-07 MED ORDER — DROPERIDOL 2.5 MG/ML IJ SOLN: 0.6250 mg | Freq: Once | INTRAMUSCULAR | Status: DC | PRN

## 2024-01-07 MED ORDER — PHENYLEPHRINE HCL (PRESSORS) 10 MG/ML IV SOLN
INTRAVENOUS | Status: DC | PRN
  Administered 2024-01-07 (×2): 80 ug via INTRAVENOUS

## 2024-01-07 MED ORDER — ACETAMINOPHEN 500 MG PO TABS
1000.0000 mg | ORAL_TABLET | ORAL | Status: AC
  Administered 2024-01-07: 1000 mg via ORAL
  Filled 2024-01-07: qty 2

## 2024-01-07 MED ORDER — HEPARIN 6000 UNIT IRRIGATION SOLUTION
Freq: Once | Status: AC
  Administered 2024-01-07: 1
  Filled 2024-01-07: qty 6000

## 2024-01-07 MED ORDER — ONDANSETRON HCL 4 MG/2ML IJ SOLN
INTRAMUSCULAR | Status: DC | PRN
  Administered 2024-01-07: 4 mg via INTRAVENOUS

## 2024-01-07 MED ORDER — BUPIVACAINE-EPINEPHRINE 0.25% -1:200000 IJ SOLN
INTRAMUSCULAR | Status: DC | PRN
  Administered 2024-01-07: 9 mL

## 2024-01-07 MED ORDER — CHLORHEXIDINE GLUCONATE 0.12 % MT SOLN
15.0000 mL | Freq: Once | OROMUCOSAL | Status: AC
  Administered 2024-01-07: 15 mL via OROMUCOSAL

## 2024-01-07 MED ORDER — FENTANYL CITRATE (PF) 100 MCG/2ML IJ SOLN
INTRAMUSCULAR | Status: DC | PRN
  Administered 2024-01-07 (×2): 50 ug via INTRAVENOUS

## 2024-01-07 MED ORDER — LIDOCAINE HCL (PF) 2 % IJ SOLN
INTRAMUSCULAR | Status: AC
  Filled 2024-01-07: qty 5

## 2024-01-07 MED ORDER — EPHEDRINE SULFATE (PRESSORS) 50 MG/ML IJ SOLN
INTRAMUSCULAR | Status: DC | PRN
  Administered 2024-01-07: 10 mg via INTRAVENOUS
  Administered 2024-01-07: 5 mg via INTRAVENOUS

## 2024-01-07 MED ORDER — DEXAMETHASONE SODIUM PHOSPHATE 10 MG/ML IJ SOLN
INTRAMUSCULAR | Status: AC
  Filled 2024-01-07: qty 1

## 2024-01-07 MED ORDER — ORAL CARE MOUTH RINSE: 15.0000 mL | Freq: Once | OROMUCOSAL | Status: AC

## 2024-01-07 MED ORDER — FENTANYL CITRATE PF 50 MCG/ML IJ SOSY: 25.0000 ug | PREFILLED_SYRINGE | INTRAMUSCULAR | Status: DC | PRN

## 2024-01-07 MED ORDER — 0.9 % SODIUM CHLORIDE (POUR BTL) OPTIME
TOPICAL | Status: DC | PRN
  Administered 2024-01-07: 1000 mL

## 2024-01-07 MED ORDER — DEXAMETHASONE SODIUM PHOSPHATE 4 MG/ML IJ SOLN
INTRAMUSCULAR | Status: DC | PRN
  Administered 2024-01-07: 8 mg via INTRAVENOUS

## 2024-01-07 MED ORDER — SODIUM CHLORIDE 0.9% FLUSH: 3.0000 mL | Freq: Two times a day (BID) | INTRAVENOUS | Status: DC

## 2024-01-07 MED ORDER — LIDOCAINE HCL (CARDIAC) PF 100 MG/5ML IV SOSY
PREFILLED_SYRINGE | INTRAVENOUS | Status: DC | PRN
Start: 2024-01-07 — End: 2024-01-07
  Administered 2024-01-07: 60 mg via INTRAVENOUS

## 2024-01-07 MED ORDER — LACTATED RINGERS IV SOLN: INTRAVENOUS | Status: DC | PRN

## 2024-01-07 SURGICAL SUPPLY — 28 items
BAG COUNTER SPONGE SURGICOUNT (BAG) IMPLANT
BAG DECANTER FOR FLEXI CONT (MISCELLANEOUS) ×1 IMPLANT
BLADE SURG 15 STRL LF DISP TIS (BLADE) ×1 IMPLANT
CHLORAPREP W/TINT 26 (MISCELLANEOUS) ×1 IMPLANT
COVER PROBE W GEL 5X96 (DRAPES) ×1 IMPLANT
DERMABOND ADVANCED .7 DNX12 (GAUZE/BANDAGES/DRESSINGS) ×1 IMPLANT
DRAPE C-ARM 42X120 X-RAY (DRAPES) ×1 IMPLANT
DRAPE LAPAROTOMY TRNSV 102X78 (DRAPES) ×1 IMPLANT
ELECT REM PT RETURN 15FT ADLT (MISCELLANEOUS) ×1 IMPLANT
GAUZE 4X4 16PLY ~~LOC~~+RFID DBL (SPONGE) ×1 IMPLANT
GLOVE BIO SURGEON STRL SZ 6.5 (GLOVE) ×1 IMPLANT
GLOVE INDICATOR 6.5 STRL GRN (GLOVE) ×2 IMPLANT
GOWN STRL REUS W/ TWL XL LVL3 (GOWN DISPOSABLE) ×2 IMPLANT
KIT BASIN OR (CUSTOM PROCEDURE TRAY) ×1 IMPLANT
KIT PORT POWER 8FR ISP CVUE (Port) IMPLANT
KIT TURNOVER KIT A (KITS) IMPLANT
NDL HYPO 25X1 1.5 SAFETY (NEEDLE) ×1 IMPLANT
NEEDLE HYPO 25X1 1.5 SAFETY (NEEDLE) ×1
PACK BASIC VI WITH GOWN DISP (CUSTOM PROCEDURE TRAY) ×1 IMPLANT
PENCIL SMOKE EVACUATOR (MISCELLANEOUS) IMPLANT
SET PORT ACCESS POWERLOC (SET/KITS/TRAYS/PACK) IMPLANT
SPIKE FLUID TRANSFER (MISCELLANEOUS) ×1 IMPLANT
SUT PROLENE 2 0 SH DA (SUTURE) ×1 IMPLANT
SUT VIC AB 3-0 SH 27XBRD (SUTURE) ×1 IMPLANT
SUT VIC AB 4-0 PS2 18 (SUTURE) ×1 IMPLANT
SYR 10ML LL (SYRINGE) ×1 IMPLANT
SYR CONTROL 10ML LL (SYRINGE) ×1 IMPLANT
TOWEL OR 17X26 10 PK STRL BLUE (TOWEL DISPOSABLE) ×1 IMPLANT

## 2024-01-07 NOTE — Anesthesia Preprocedure Evaluation (Signed)
Anesthesia Evaluation  Patient identified by MRN, date of birth, ID band Patient awake    Reviewed: Allergy & Precautions, NPO status , Patient's Chart, lab work & pertinent test results  Airway Mallampati: II  TM Distance: >3 FB Neck ROM: Full    Dental no notable dental hx.    Pulmonary asthma , former smoker   Pulmonary exam normal        Cardiovascular hypertension, Pt. on medications + dysrhythmias  Rhythm:Regular Rate:Normal     Neuro/Psych   Anxiety Depression    negative neurological ROS     GI/Hepatic Neg liver ROS,,,Rectal cancer    Endo/Other  negative endocrine ROS    Renal/GU   negative genitourinary   Musculoskeletal  (+) Arthritis , Osteoarthritis,    Abdominal Normal abdominal exam  (+)   Peds  Hematology Lab Results      Component                Value               Date                      WBC                      3.0 (L)             12/17/2023                HGB                      11.7 (L)            12/17/2023                HCT                      34.3 (L)            12/17/2023                MCV                      95.0                12/17/2023                PLT                      129 (L)             12/17/2023              Anesthesia Other Findings   Reproductive/Obstetrics                             Anesthesia Physical Anesthesia Plan  ASA: 2  Anesthesia Plan: General   Post-op Pain Management:    Induction: Intravenous  PONV Risk Score and Plan: 3 and Ondansetron, Dexamethasone and Treatment may vary due to age or medical condition  Airway Management Planned: Mask and LMA  Additional Equipment: None  Intra-op Plan:   Post-operative Plan: Extubation in OR  Informed Consent: I have reviewed the patients History and Physical, chart, labs and discussed the procedure including the risks, benefits and alternatives for the proposed anesthesia  with the patient or authorized representative who has indicated his/her understanding and acceptance.  Dental advisory given  Plan Discussed with: CRNA  Anesthesia Plan Comments:        Anesthesia Quick Evaluation

## 2024-01-07 NOTE — Transfer of Care (Signed)
Immediate Anesthesia Transfer of Care Note  Patient: Samantha Jenkins  Procedure(s) Performed: PORT PLACEMENT WITH ULTRASOUND GUIDANCE  Patient Location: PACU  Anesthesia Type:General  Level of Consciousness: drowsy  Airway & Oxygen Therapy: Patient Spontanous Breathing and Patient connected to face mask oxygen  Post-op Assessment: Report given to RN and Post -op Vital signs reviewed and stable  Post vital signs: Reviewed and stable  Last Vitals:  Vitals Value Taken Time  BP 147/60 01/07/24 1604  Temp    Pulse 67 01/07/24 1606  Resp 12 01/07/24 1606  SpO2 100 % 01/07/24 1606  Vitals shown include unfiled device data.  Last Pain: There were no vitals filed for this visit.       Complications: No notable events documented.

## 2024-01-07 NOTE — Anesthesia Postprocedure Evaluation (Signed)
Anesthesia Post Note  Patient: Samantha Jenkins  Procedure(s) Performed: PORT PLACEMENT WITH ULTRASOUND GUIDANCE     Patient location during evaluation: PACU Anesthesia Type: General Level of consciousness: awake and alert Pain management: pain level controlled Vital Signs Assessment: post-procedure vital signs reviewed and stable Respiratory status: spontaneous breathing, nonlabored ventilation, respiratory function stable and patient connected to nasal cannula oxygen Cardiovascular status: blood pressure returned to baseline and stable Postop Assessment: no apparent nausea or vomiting Anesthetic complications: no   No notable events documented.  Last Vitals:  Vitals:   01/07/24 1715 01/07/24 1730  BP: 137/89 127/61  Pulse: 62 62  Resp: 18 10  Temp:  (!) 36.2 C  SpO2: 95% 97%    Last Pain:  Vitals:   01/07/24 1730  PainSc: 0-No pain                 Earl Lites P Erric Machnik

## 2024-01-07 NOTE — H&P (Signed)
HPI: Samantha Jenkins is an 83 y.o. female who is here for [facility] placement for distal rectal cancer.    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   Depression    Dysthymia    FHx: cardiovascular disease    History of blood transfusion as child   History of kidney stones    Hyperlipidemia    Hypertension    LBBB (left bundle branch block) 2014   on ekg   Obesity    Pneumonia    hx of as a child   Vitamin D deficiency     Past Surgical History:  Procedure Laterality Date   ABDOMINAL HYSTERECTOMY  1986   partial   APPENDECTOMY  1986   with hysterectomy   CATARACT EXTRACTION Bilateral 2016   CYSTOSCOPY/URETEROSCOPY/HOLMIUM LASER/STENT PLACEMENT Left 02/09/2021   Procedure: LEFT URETEROSCOPY/ LASER LITHOTRIPSY  AND  STENT PLACEMENT,ENDOPYLOTOMY;  Surgeon: Crist Fat, MD;  Location: [facility];  Service: Urology;  Laterality: Left;   CYSTOSCOPY/URETEROSCOPY/HOLMIUM LASER/STENT PLACEMENT Left 03/15/2021   Procedure: CYSTOSCOPY, LEFT URETEROSCOPY, HOLMIUM LASER ENDOPYELOTOMY, URETERAL STENT PLACEMENT;  Surgeon: Crist Fat, MD;  Location: [facility];  Service: Urology;  Laterality: Left;   EYE SURGERY  2016   bil cataracts   HOLMIUM LASER APPLICATION Left 02/09/2021   Procedure: HOLMIUM LASER APPLICATION;  Surgeon: Crist Fat, MD;  Location: [facility];  Service: Urology;  Laterality: Left;   KNEE ARTHROSCOPY  1995   left   NEPHROLITHOTOMY Left 03/23/2020   Procedure: LEFT NEPHROLITHOTOMY PERCUTANEOUS WITH SURGEON ACCESS;  Surgeon: Crist Fat, MD;  Location: [facility];  Service: Urology;  Laterality: Left;   TONSILLECTOMY  1945   adenoids removed   TOTAL HIP ARTHROPLASTY Left 06/03/2023   Procedure: TOTAL HIP ARTHROPLASTY ANTERIOR APPROACH;  Surgeon: Durene Romans, MD;  Location: [facility];   Service: Orthopedics;  Laterality: Left;   TOTAL KNEE ARTHROPLASTY Right 06/06/2020   Procedure: TOTAL KNEE ARTHROPLASTY;  Surgeon: Durene Romans, MD;  Location: [facility];  Service: Orthopedics;  Laterality: Right;  70 mins   TOTAL KNEE ARTHROPLASTY Left 07/17/2021   Procedure: TOTAL KNEE ARTHROPLASTY;  Surgeon: Durene Romans, MD;  Location: [facility];  Service: Orthopedics;  Laterality: Left;   TUBAL LIGATION  1979    Family History  Problem Relation Age of Onset   Breast cancer Mother    Heart disease Father    Colon cancer Neg Hx    Stomach cancer Neg Hx    Rectal cancer Neg Hx     Social:  reports that she quit smoking about 56 years ago. Her smoking use included cigarettes. She started smoking about 66 years ago. She has a 5 pack-year smoking history. She has never used smokeless tobacco. She reports that she does not currently use alcohol after a past usage of about 1.0 standard drink of alcohol per week. She reports that she does not use drugs.  Allergies:  Allergies  Allergen Reactions   Statins     Muscle aches can take pravastatin   Sulfa Antibiotics Other (See Comments)    Blood in urine    Medications: I have reviewed the patient's current medications.  No results found for this or any previous visit (from the past 48 hours).  No results found.  ROS - all of the below systems have been reviewed with the patient and positives are indicated with bold text General: chills, fever or night sweats Eyes: blurry vision or double vision ENT: epistaxis or sore throat Hematologic/Lymphatic: bleeding problems, blood clots or swollen lymph nodes Endocrine: temperature intolerance or unexpected weight changes Resp: cough, shortness of breath, or wheezing CV: chest pain or dyspnea on exertion GI: as per HPI GU: dysuria, trouble voiding, or hematuria Neuro: TIA or stroke symptoms    PE There were no vitals taken for this visit. Constitutional: NAD; conversant; no  deformities Eyes: Moist conjunctiva; no lid lag; anicteric; PERRL Neck: Trachea midline; no thyromegaly Lungs: Normal respiratory effort CV: RRR GI: Abd soft MSK: Normal range of motion of extremities; no clubbing/cyanosis Psychiatric: Appropriate affect; alert and oriented x3  No results found for this or any previous visit (from the past 48 hours).  No results found.   A/P: Samantha Jenkins is an 83 y.o. female with distal rectal cancer undergoing total neoadjuvant therapy.  She has completed radiation and is ready to start chemotherapy.  I was asked to place a PAC.  We have discussed this in detail including risks of bleeding, infection, device malfunction and pneumothorax.  All questions answered.  Pt wishes to proceed with surgery.    Vanita Panda, MD  Colorectal and General Surgery [facility]

## 2024-01-07 NOTE — Op Note (Signed)
01/07/2024  3:56 PM  PATIENT:  Samantha Jenkins  83 y.o. female  Patient Care Team: Irven Coe, MD as PCP - General (Family Medicine) Malachy Mood, MD as Consulting Physician (Hematology and Oncology)  PRE-OPERATIVE DIAGNOSIS:  RECTAL CANCER  POST-OPERATIVE DIAGNOSIS:  RECTAL CANCER  PROCEDURE:  PORT PLACEMENT WITH ULTRASOUND GUIDANCE    Surgeon(s): Romie Levee, MD  ANESTHESIA:   local and general  EBL:  10ml  DISPOSITION OF SPECIMEN:  N/A  COUNTS:  YES  PLAN OF CARE: Discharge to home after PACU  PATIENT DISPOSITION:  PACU - hemodynamically stable.  INDICATION: Patient with need for IV chemotherapy. Port-A-Cath placement was requested.   Use of a central venous catheter for intravenous therapy was discussed. Technique of catheter placement using ultrasound and fluoroscopy guidance was discussed. Risks such as bleeding, infection, pneumothorax, catheter occlusion, reoperation, and other risks were discussed. I noted a good likelihood this will help address the problem. Questions were answered. The patient expressed understanding & wishes to proceed.   Findings: Normal-appearing anatomy.   8 [place] power port. It goes through the right internal jugular vein   Procedure: Informed consent was confirmed. Patient was brought the operating room and positioned supine. Arms were tucked. The patient underwent deep sedation. Neck and chest were clipped and prepped and draped in a sterile fashion. A surgical timeout confirmed our plan. I placed a field block of local anesthesia on the chest.  I entered into the right internal jugular vein on the first venipuncture using US guidance. Non-pulsatile blood was returned. Wire was easily passed into the inferior vena cava and confirmed by fluoroscopy. I confirmed placement of the wire in the right side of the chest.  I made an incision in the lateral infraclavicular area and made a subcutaneous pocket. I used a dilator on the wire using Seldinger  technique to dilate the tract under fluoroscopy. I placed the catheter into the sheath. I then peeled away the dilator sheath. I tunneled the power port from the puncture site to the chest pocket. I cut the catheter to appropriate length and attached it to the port using the plastic connector. The port was placed into the pocket and secured to the left anterior chest wall using 2-0 Prolene interrupted stitches x2. Catheter flushed well.  Fluoroscopy confirmed the tip in the distal SVC. Catheter aspirated and flushed well. On final fluoro reevaluation the tip seen to be in good position in the distal SVC.  I closed the wounds using 3-0 Vicryl interrupted sutures for the pocket and 4-0 Monocryl stitch was used to close the skin. Dermabond was used on the 2 incisions. CXR will be performed in PACU. Patient should go home later today. Catheter is okay to use.   Vanita Panda, MD  Colorectal and General Surgery [facility]

## 2024-01-07 NOTE — Anesthesia Procedure Notes (Signed)
Procedure Name: LMA Insertion Date/Time: 01/07/2024 3:08 PM  Performed by: Jamelle Rushing, CRNAPre-anesthesia Checklist: Patient identified, Emergency Drugs available, Suction available, Patient being monitored and Timeout performed Patient Re-evaluated:Patient Re-evaluated prior to induction Oxygen Delivery Method: Circle system utilized Preoxygenation: Pre-oxygenation with 100% oxygen Induction Type: IV induction Ventilation: Mask ventilation without difficulty LMA: LMA inserted LMA Size: 4.0 Number of attempts: 1 Placement Confirmation: positive ETCO2 and CO2 detector Tube secured with: Tape Dental Injury: Teeth and Oropharynx as per pre-operative assessment

## 2024-01-07 NOTE — Discharge Instructions (Addendum)
PORT-A-CATH: POST OP INSTRUCTIONS  Always review your discharge instruction sheet given to you by the facility where your surgery was performed.   You make take acetaminophen (Tylenol) or ibuprofen (Advil) as needed.  Ice for the first 24h and heat applied in interval is ok too for added pain relief Take your usually prescribed medications unless otherwise directed. If you need a stronger pain medication, please contact our office.  Prescriptions will not be filled after 5 pm or on weekends.  You should follow a light diet for the remainder of the day after your procedure. Most patients will experience some mild swelling and/or bruising in the area of the incision. It may take several days to resolve. It is common to experience some constipation after surgery. Increasing fluid intake and taking a stool softener (such as Colace) will usually help or prevent this problem from occurring. A mild laxative (Milk of Magnesia or Miralax) should be taken according to package directions if there are no bowel movements after 48 hours.  Your surgeon used Dermabond (skin glue) on the incision, you may shower in 24 hours.  The glue will flake off over the next 2-3 weeks.  ACTIVITIES:  Limit activity involving your arms for the next 48 hours. Do no strenuous exercise or activity for 1 week. You may drive when you are no longer taking prescription pain medication, you can comfortably wear a seatbelt, and you can maneuver your car. 10.You may need to see your doctor in the office for a follow-up appointment.  Please       check with your doctor.  11.When you receive a new Port-a-Cath, you will get a product guide and        ID card.  Please keep them in case you need them.  WHEN TO CALL YOUR DOCTOR [phone]): Fever over 101.0 Chills Continued bleeding from incision Increased redness and tenderness at the site Shortness of breath, difficulty breathing   The clinic staff is available to answer your  questions during regular business hours. Please don't hesitate to call and ask to speak to one of the nurses or medical assistants for clinical concerns. If you have a medical emergency, go to the nearest emergency room or call 911.  A surgeon from [facility] is always on call at the hospital.     For further information, please visit [email or web address]

## 2024-01-08 ENCOUNTER — Encounter (HOSPITAL_COMMUNITY): Payer: Self-pay | Admitting: General Surgery

## 2024-01-08 ENCOUNTER — Other Ambulatory Visit: Payer: Self-pay

## 2024-01-12 ENCOUNTER — Inpatient Hospital Stay: Payer: PPO

## 2024-01-12 ENCOUNTER — Other Ambulatory Visit (HOSPITAL_COMMUNITY): Payer: Self-pay

## 2024-01-12 ENCOUNTER — Telehealth: Payer: Self-pay

## 2024-01-12 DIAGNOSIS — C20 Malignant neoplasm of rectum: Secondary | ICD-10-CM

## 2024-01-12 NOTE — Telephone Encounter (Signed)
 Samantha Jenkins could not make appointment due to her elevators not working as she lives on the 9th floor of her building. She is rescheduled for lab tomorrow at 3!% pm and Education at 4 pm with Chemotherapy r/s to 01-14-24 at 12-30 and [name] will see her in the infusion room. Pt verbalized understating of new appointment times. Pt. On My chart as well.

## 2024-01-13 ENCOUNTER — Inpatient Hospital Stay: Payer: PPO | Admitting: Hematology

## 2024-01-13 ENCOUNTER — Inpatient Hospital Stay: Payer: PPO | Attending: Radiation Oncology

## 2024-01-13 ENCOUNTER — Inpatient Hospital Stay: Payer: PPO

## 2024-01-13 DIAGNOSIS — Z5111 Encounter for antineoplastic chemotherapy: Secondary | ICD-10-CM | POA: Insufficient documentation

## 2024-01-13 DIAGNOSIS — Z95828 Presence of other vascular implants and grafts: Secondary | ICD-10-CM | POA: Insufficient documentation

## 2024-01-13 DIAGNOSIS — Z79630 Long term (current) use of alkylating agent: Secondary | ICD-10-CM | POA: Insufficient documentation

## 2024-01-13 DIAGNOSIS — C20 Malignant neoplasm of rectum: Secondary | ICD-10-CM | POA: Insufficient documentation

## 2024-01-13 LAB — CMP (CANCER CENTER ONLY)
ALT: 8 U/L (ref 0–44)
AST: 19 U/L (ref 15–41)
Albumin: 3.9 g/dL (ref 3.5–5.0)
Alkaline Phosphatase: 103 U/L (ref 38–126)
Anion gap: 5 (ref 5–15)
BUN: 12 mg/dL (ref 8–23)
CO2: 29 mmol/L (ref 22–32)
Calcium: 9.3 mg/dL (ref 8.9–10.3)
Chloride: 108 mmol/L (ref 98–111)
Creatinine: 0.98 mg/dL (ref 0.44–1.00)
GFR, Estimated: 57 mL/min — ABNORMAL LOW (ref >60)
Glucose, Bld: 102 mg/dL — ABNORMAL HIGH (ref 70–99)
Potassium: 3.8 mmol/L (ref 3.5–5.1)
Sodium: 142 mmol/L (ref 135–145)
Total Bilirubin: 0.6 mg/dL (ref 0.0–1.2)
Total Protein: 6.7 g/dL (ref 6.5–8.1)

## 2024-01-13 LAB — CBC WITH DIFFERENTIAL (CANCER CENTER ONLY)
Abs Immature Granulocytes: 0.01 10*3/uL (ref 0.00–0.07)
Basophils Absolute: 0 10*3/uL (ref 0.0–0.1)
Basophils Relative: 1 %
Eosinophils Absolute: 0.2 10*3/uL (ref 0.0–0.5)
Eosinophils Relative: 4 %
HCT: 35.3 % — ABNORMAL LOW (ref 36.0–46.0)
Hemoglobin: 12 g/dL (ref 12.0–15.0)
Immature Granulocytes: 0 %
Lymphocytes Relative: 24 %
Lymphs Abs: 1 10*3/uL (ref 0.7–4.0)
MCH: 33 pg (ref 26.0–34.0)
MCHC: 34 g/dL (ref 30.0–36.0)
MCV: 97 fL (ref 80.0–100.0)
Monocytes Absolute: 0.4 10*3/uL (ref 0.1–1.0)
Monocytes Relative: 10 %
Neutro Abs: 2.4 10*3/uL (ref 1.7–7.7)
Neutrophils Relative %: 61 %
Platelet Count: 170 10*3/uL (ref 150–400)
RBC: 3.64 MIL/uL — ABNORMAL LOW (ref 3.87–5.11)
RDW: 16.3 % — ABNORMAL HIGH (ref 11.5–15.5)
WBC Count: 4 10*3/uL (ref 4.0–10.5)
nRBC: 0 % (ref 0.0–0.2)

## 2024-01-13 MED ORDER — HEPARIN SOD (PORK) LOCK FLUSH 100 UNIT/ML IV SOLN
500.0000 [IU] | Freq: Once | INTRAVENOUS | Status: AC
Start: 2024-01-13 — End: 2024-01-13
  Administered 2024-01-13: 500 [IU]

## 2024-01-13 MED ORDER — SODIUM CHLORIDE 0.9% FLUSH
10.0000 mL | Freq: Once | INTRAVENOUS | Status: AC
  Administered 2024-01-13: 10 mL

## 2024-01-13 NOTE — Assessment & Plan Note (Addendum)
-  [id number], stage IIIB, G2 -presented with rectal bleeding.  I reviewed her recent colonoscopy findings, biopsy results, and her staging CT images.  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  -pelvic MRI 10/24/2023 showed  -pt was seen by surgeon Dr. Maisie Fus and rad/onc Dr. Mitzi Hansen -she agrees with TNT, and started concurrent chemoRT on 11/03/2023 and completed on 12/17/2022 -plan to start chemo FOLFOX on 01/14/2024 for 8 cycles

## 2024-01-14 ENCOUNTER — Encounter: Payer: Self-pay | Admitting: Hematology

## 2024-01-14 ENCOUNTER — Inpatient Hospital Stay: Payer: PPO

## 2024-01-14 ENCOUNTER — Inpatient Hospital Stay (HOSPITAL_BASED_OUTPATIENT_CLINIC_OR_DEPARTMENT_OTHER): Payer: PPO | Admitting: Hematology

## 2024-01-14 VITALS — BP 165/75 | HR 58 | Temp 98.2°F | Resp 18

## 2024-01-14 DIAGNOSIS — C20 Malignant neoplasm of rectum: Secondary | ICD-10-CM | POA: Diagnosis not present

## 2024-01-14 DIAGNOSIS — Z5111 Encounter for antineoplastic chemotherapy: Secondary | ICD-10-CM | POA: Diagnosis not present

## 2024-01-14 MED ORDER — PALONOSETRON HCL INJECTION 0.25 MG/5ML
0.2500 mg | Freq: Once | INTRAVENOUS | Status: AC
Start: 2024-01-14 — End: 2024-01-14
  Administered 2024-01-14: 0.25 mg via INTRAVENOUS
  Filled 2024-01-14: qty 5

## 2024-01-14 MED ORDER — OXALIPLATIN CHEMO INJECTION 100 MG/20ML
60.0000 mg/m2 | Freq: Once | INTRAVENOUS | Status: AC
  Administered 2024-01-14: 115 mg via INTRAVENOUS
  Filled 2024-01-14: qty 3

## 2024-01-14 MED ORDER — DEXTROSE 5 % IV SOLN: INTRAVENOUS | Status: DC

## 2024-01-14 MED ORDER — LEUCOVORIN CALCIUM INJECTION 350 MG
400.0000 mg/m2 | Freq: Once | INTRAVENOUS | Status: AC
  Administered 2024-01-14: 756 mg via INTRAVENOUS
  Filled 2024-01-14: qty 25

## 2024-01-14 MED ORDER — DEXAMETHASONE SODIUM PHOSPHATE 10 MG/ML IJ SOLN
10.0000 mg | Freq: Once | INTRAMUSCULAR | Status: AC
  Administered 2024-01-14: 10 mg via INTRAVENOUS
  Filled 2024-01-14: qty 1

## 2024-01-14 MED ORDER — SODIUM CHLORIDE 0.9 % IV SOLN
2400.0000 mg/m2 | INTRAVENOUS | Status: DC
  Administered 2024-01-14: 5000 mg via INTRAVENOUS
  Filled 2024-01-14: qty 100

## 2024-01-14 NOTE — Patient Instructions (Signed)
 [facility] - A DEPT OF [facility]  Discharge Instructions: Thank you for choosing [facility] to provide your oncology and hematology care.   If you have a lab appointment with the Cancer Center, please go directly to the Cancer Center and check in at the registration area.   Wear comfortable clothing and clothing appropriate for easy access to any Portacath or PICC line.   We strive to give you quality time with your provider. You may need to reschedule your appointment if you arrive late (15 or more minutes).  Arriving late affects you and other patients whose appointments are after yours.  Also, if you miss three or more appointments without notifying the office, you may be dismissed from the clinic at the provider's discretion.      For prescription refill requests, have your pharmacy contact our office and allow 72 hours for refills to be completed.    Today you received the following chemotherapy and/or immunotherapy agents oxaliplatin, lucovorin, adrucil       To help prevent nausea and vomiting after your treatment, we encourage you to take your nausea medication as directed.  BELOW ARE SYMPTOMS THAT SHOULD BE REPORTED IMMEDIATELY: *FEVER GREATER THAN 100.4 F (38 C) OR HIGHER *CHILLS OR SWEATING *NAUSEA AND VOMITING THAT IS NOT CONTROLLED WITH YOUR NAUSEA MEDICATION *UNUSUAL SHORTNESS OF BREATH *UNUSUAL BRUISING OR BLEEDING *URINARY PROBLEMS (pain or burning when urinating, or frequent urination) *BOWEL PROBLEMS (unusual diarrhea, constipation, pain near the anus) TENDERNESS IN MOUTH AND THROAT WITH OR WITHOUT PRESENCE OF ULCERS (sore throat, sores in mouth, or a toothache) UNUSUAL RASH, SWELLING OR PAIN  UNUSUAL VAGINAL DISCHARGE OR ITCHING   Items with * indicate a potential emergency and should be followed up as soon as possible or go to the Emergency Department if any problems should occur.  Please show the CHEMOTHERAPY ALERT  CARD or IMMUNOTHERAPY ALERT CARD at check-in to the Emergency Department and triage nurse.  Should you have questions after your visit or need to cancel or reschedule your appointment, please contact [facility] - A DEPT OF [facility]  Dept: [phone]  and follow the prompts.  Office hours are 8:00 a.m. to 4:30 p.m. Monday - Friday. Please note that voicemails left after 4:00 p.m. may not be returned until the following business day.  We are closed weekends and major holidays. You have access to a nurse at all times for urgent questions. Please call the main number to the clinic Dept: [phone] and follow the prompts.   For any non-urgent questions, you may also contact your provider using MyChart. We now offer e-Visits for anyone 24 and older to request care online for non-urgent symptoms. For details visit [email or web address].   Also download the MyChart app! Go to the app store, search "MyChart", open the app, select [facility], and log in with your MyChart username and password.

## 2024-01-14 NOTE — Progress Notes (Signed)
 [facility]   [phone] [phone]   Clinic Follow up Note   Patient Care Team: Irven Coe, MD as PCP - General (Family Medicine) Malachy Mood, MD as Consulting Physician (Hematology and Oncology)  Date of Service:  01/14/2024  CHIEF COMPLAINT: f/u of rectal cancer  CURRENT THERAPY:  New adjuvant chemotherapy FOLFOX  Oncology History   Adenocarcinoma of rectum (HCC) -cT3bN1M0, stage IIIB, G2 -presented with rectal bleeding.  I reviewed her recent colonoscopy findings, biopsy results, and her staging CT images.  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  -pelvic MRI 10/24/2023 showed  -pt was seen by surgeon Dr. Maisie Fus and rad/onc Dr. Mitzi Hansen -she agrees with TNT, and started concurrent chemoRT on 11/03/2023 and completed on 12/17/2022 -plan to start chemo FOLFOX on 01/14/2024 for 8 cycles     Assessment and Plan    Rectal Cancer   Follow-up for rectal cancer to initiate the first cycle of neoadjuvant FOLFOX chemotherapy. Patient is in good spirits and managing well. Emphasized hydration and monitoring for severe side effects. Discussed regular blood pressure monitoring due to potential dehydration. Patient's son provides support.   - Administer first cycle of FOLFOX chemotherapy   - Ensure anti-nausea medication is available at home   - Advise monitoring for severe side effects and contact clinic if she occurs   - Encourage adequate hydration and use of hydration chart   - Check blood pressure every couple of days, especially if not eating adequately   - Schedule follow-up appointment in two weeks.     Plan -Lab reviewed, adequate for treatment, will proceed first cycle chemotherapy -Lab, follow-up and second cycle chemotherapy in 2 weeks    SUMMARY OF ONCOLOGIC HISTORY: Oncology History  Adenocarcinoma of rectum (HCC)  09/24/2023 Imaging   CT chest abdomen and pelvis with contrast  IMPRESSION: 1. Rectal  primary with nodal metastasis in the mesorectum and sigmoid mesocolon. 2. No distant metastasis. 3. Chronic left-sided hydronephrosis with overlying cortical thinning and decreased/absent left renal function. Favor chronic ureteropelvic junction obstruction, given absence of hydroureter. 4. 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 5. Incidental findings, including: Coronary artery atherosclerosis. Aortic Atherosclerosis (ICD10-I70.0). Cholelithiasis. Bilateral nephrolithiasis.   10/02/2023 Initial Diagnosis   Adenocarcinoma of rectum (HCC)   10/24/2023 Cancer Staging   Staging form: Colon and Rectum, AJCC 8th Edition - Clinical stage from 10/24/2023: Stage IIIB (cT3, cN1, cM0) - Signed by Malachy Mood, MD on 10/31/2023 Histologic grade (G): G2 Histologic grading system: 4 grade system   10/29/2023 Imaging   Pelvic MRI FINDINGS: TUMOR LOCATION  Tumor distance from Anal Verge/Skin surface: 11.1 cm.  Tumor distance to Internal Anal sphincter: 7.4 cm.  TUMOR DESCRIPTION  Circumferential extent: 9 to 6 o'clock position, 75% of the total circumference, along the superior aspect.   Tumor Size and volume: 4.6 cm craniocaudal. Up to 1.9 cm in thickness.   T - CATEGORY   Extension through Muscularis Propria: Yes 1-36mm=T3b (approximately 1.6-1.8 mm.)   Shortest Distance of any tumor/node from Mesorectal fascia: 1.4 cm.   Extramural Vascular Invasion/Tumor Thrombus: No.   Invasion of Anterior Peritoneal Reflection: No.  Involvement of Adjacent Organs or Pelvic Sidewall: No.   Levator Ani Involvement: No.   N - CATEGORY   Mesorectal Lymph Nodes >=9mm: 1-3=N1   Extra-mesorectal Lymphadenopathy: No   Other: There are multiple sigmoid diverticula without imaging signs diverticulitis. Urinary bladder is decompressed. Surgically absent uterus.  No large adnexal mass seen.   IMPRESSION: Rectal adenocarcinoma T stage: T3b   Rectal adenocarcinoma N  stage: N1   Distance from tumor to the internal anal sphincter is 7.4 cm.       01/14/2024 -  Chemotherapy   Patient is on Treatment Plan : COLORECTAL FOLFOX q14d x 4 months        Discussed the use of AI scribe software for clinical note transcription with the patient, who gave verbal consent to proceed.  History of Present Illness   The patient, with a known diagnosis of rectal cancer, is scheduled to start the first cycle of neoadjuvant chemotherapy today. She reports feeling well and prepared for the treatment. The patient has a supportive network, with her son living nearby and frequently checking in on her. The patient also mentions another son living in [place] with his family, indicating a strong family support system. The patient is proactive about her health, as evidenced by her efforts to stay hydrated and monitor her food intake. She has also expressed concerns about potential side effects of chemotherapy, such as nausea and loss of appetite, and has been advised to take medication if these symptoms occur.         All other systems were reviewed with the patient and are negative.  MEDICAL HISTORY:  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   Depression    Dysthymia    FHx: cardiovascular disease    History of blood transfusion as child   History of kidney stones    Hyperlipidemia    Hypertension    LBBB (left bundle branch block) 2014   on ekg   Obesity    Pneumonia    hx of as a child   Vitamin D deficiency     SURGICAL HISTORY: Past Surgical History:  Procedure Laterality Date   ABDOMINAL HYSTERECTOMY  1986   partial   APPENDECTOMY  1986   with hysterectomy   CATARACT EXTRACTION Bilateral 2016   CYSTOSCOPY/URETEROSCOPY/HOLMIUM LASER/STENT PLACEMENT Left 02/09/2021   Procedure: LEFT URETEROSCOPY/ LASER LITHOTRIPSY  AND   STENT PLACEMENT,ENDOPYLOTOMY;  Surgeon: Crist Fat, MD;  [facility];  Service: Urology;  Laterality: Left;   CYSTOSCOPY/URETEROSCOPY/HOLMIUM LASER/STENT PLACEMENT Left 03/15/2021   Procedure: CYSTOSCOPY, LEFT URETEROSCOPY, HOLMIUM LASER ENDOPYELOTOMY, URETERAL STENT PLACEMENT;  Surgeon: Crist Fat, MD;  Location: [facility];  Service: Urology;  Laterality: Left;   EYE SURGERY  2016   bil cataracts   HOLMIUM LASER APPLICATION Left 02/09/2021   Procedure: HOLMIUM LASER APPLICATION;  Surgeon: Crist Fat, MD;  Location: [facility];  Service: Urology;  Laterality: Left;   KNEE ARTHROSCOPY  1995   left   NEPHROLITHOTOMY Left 03/23/2020   Procedure: LEFT NEPHROLITHOTOMY PERCUTANEOUS WITH SURGEON ACCESS;  Surgeon: Crist Fat, MD;  Location: [facility];  Service: Urology;  Laterality: Left;   PORTACATH PLACEMENT N/A 01/07/2024   Procedure: PORT PLACEMENT WITH ULTRASOUND GUIDANCE;  Surgeon: Romie Levee, MD;  Location: [facility];  Service: General;  Laterality: N/A;   TONSILLECTOMY  1945   adenoids removed   TOTAL HIP ARTHROPLASTY Left 06/03/2023   Procedure: TOTAL HIP ARTHROPLASTY ANTERIOR APPROACH;  Surgeon: Durene Romans, MD;  Location: [facility];  Service: Orthopedics;  Laterality: Left;   TOTAL  KNEE ARTHROPLASTY Right 06/06/2020   Procedure: TOTAL KNEE ARTHROPLASTY;  Surgeon: Durene Romans, MD;  Location: [facility];  Service: Orthopedics;  Laterality: Right;  70 mins   TOTAL KNEE ARTHROPLASTY Left 07/17/2021   Procedure: TOTAL KNEE ARTHROPLASTY;  Surgeon: Durene Romans, MD;  Location: [facility];  Service: Orthopedics;  Laterality: Left;   TUBAL LIGATION  1979    I have reviewed the social history and family history with the patient and they are unchanged from previous note.  ALLERGIES:  is allergic to statins and sulfa antibiotics.  MEDICATIONS:  Current Outpatient Medications  Medication Sig Dispense Refill   albuterol (PROVENTIL  HFA;VENTOLIN HFA) 108 (90 BASE) MCG/ACT inhaler Inhale 2 puffs into the lungs every 6 (six) hours as needed for wheezing or shortness of breath. 1 Inhaler 0   bacitracin-polymyxin b (POLYSPORIN) ointment Apply 1 application topically daily as needed (wound care).     bisoprolol-hydrochlorothiazide (ZIAC) 5-6.25 MG tablet TAKE 1 TABLET BY MOUTH DAILY. (Patient taking differently: Take 1 tablet by mouth at bedtime.) 90 tablet 0   diclofenac Sodium (VOLTAREN) 1 % GEL Apply 1 Application topically 2 (two) times daily as needed (pain.).     Emollient (UDDERLY SMOOTH EX) Apply 1 Application topically as needed (dry/irritated skin).     lidocaine-prilocaine (EMLA) cream Apply to affected area once 30 g 3   ondansetron (ZOFRAN) 8 MG tablet Take 1 tablet (8 mg total) by mouth every 8 (eight) hours as needed for nausea or vomiting. Start on the third day after chemotherapy. 30 tablet 1   potassium chloride (KLOR-CON M) 10 MEQ tablet Take 1 tablet (10 mEq total) by mouth daily. 30 tablet 1   pravastatin (PRAVACHOL) 40 MG tablet TAKE 1 TABLET EVERY DAY (Patient taking differently: Take 40 mg by mouth every evening.) 90 tablet 0   prochlorperazine (COMPAZINE) 10 MG tablet Take 1 tablet (10 mg total) by mouth every 6 (six) hours as needed for nausea or vomiting. 30 tablet 1   Propylene Glycol, PF, (SYSTANE COMPLETE PF) 0.6 % SOLN Place 1 drop into both eyes daily.     sertraline (ZOLOFT) 100 MG tablet TAKE 1 TABLET EVERY DAY (Patient taking differently: Take 150 mg by mouth at bedtime.) 90 tablet 0   traZODone (DESYREL) 50 MG tablet TAKE ONE TABLET BY MOUTH ONCE a DAY at bedtime AS NEEDED     No current facility-administered medications for this visit.    PHYSICAL EXAMINATION: ECOG PERFORMANCE STATUS: 1 - Symptomatic but completely ambulatory  There were no vitals filed for this visit. Wt Readings from Last 3 Encounters:  12/25/23 170 lb 10.2 oz (77.4 kg)  12/17/23 170 lb 12.8 oz (77.5 kg)  12/03/23 176 lb  8 oz (80.1 kg)     GENERAL:alert, no distress and comfortable SKIN: skin color, texture, turgor are normal, no rashes or significant lesions EYES: normal, Conjunctiva are pink and non-injected, sclera clear NECK: supple, thyroid normal size, non-tender, without nodularity LYMPH:  no palpable lymphadenopathy in the cervical, axillary  LUNGS: clear to auscultation and percussion with normal breathing effort HEART: regular rate & rhythm and no murmurs and no lower extremity edema ABDOMEN:abdomen soft, non-tender and normal bowel sounds Musculoskeletal:no cyanosis of digits and no clubbing  NEURO: alert & oriented x 3 with fluent speech, no focal motor/sensory deficits    LABORATORY DATA:  I have reviewed the data as listed    Latest Ref Rng & Units 01/13/2024    2:58 PM 12/17/2023    1:20  PM 12/03/2023   10:11 AM  CBC  WBC 4.0 - 10.5 K/uL 4.0  3.0  3.2   Hemoglobin 12.0 - 15.0 g/dL 16.1  09.6  04.5   Hematocrit 36.0 - 46.0 % 35.3  34.3  34.8   Platelets 150 - 400 K/uL 170  129  116         Latest Ref Rng & Units 01/13/2024    2:58 PM 12/17/2023    1:20 PM 12/03/2023   10:11 AM  CMP  Glucose 70 - 99 mg/dL 409  811  98   BUN 8 - 23 mg/dL 12  11  11    Creatinine 0.44 - 1.00 mg/dL 9.14  7.82  9.56   Sodium 135 - 145 mmol/L 142  144  140   Potassium 3.5 - 5.1 mmol/L 3.8  3.3  3.2   Chloride 98 - 111 mmol/L 108  109  107   CO2 22 - 32 mmol/L 29  28  28    Calcium 8.9 - 10.3 mg/dL 9.3  9.5  9.4   Total Protein 6.5 - 8.1 g/dL 6.7  6.4  6.3   Total Bilirubin 0.0 - 1.2 mg/dL 0.6  1.0  0.8   Alkaline Phos 38 - 126 U/L 103  111  99   AST 15 - 41 U/L 19  20  16    ALT 0 - 44 U/L 8  8  8        RADIOGRAPHIC STUDIES: I have personally reviewed the radiological images as listed and agreed with the findings in the report. No results found.    Orders Placed This Encounter  Procedures   CBC with Differential (Cancer Center Only)    Standing Status:   Future    Expected Date:    02/10/2024    Expiration Date:   02/09/2025   CMP (Cancer Center only)    Standing Status:   Future    Expected Date:   02/10/2024    Expiration Date:   02/09/2025   CBC with Differential (Cancer Center Only)    Standing Status:   Future    Expected Date:   02/24/2024    Expiration Date:   02/23/2025   CMP (Cancer Center only)    Standing Status:   Future    Expected Date:   02/24/2024    Expiration Date:   02/23/2025   CBC with Differential (Cancer Center Only)    Standing Status:   Future    Expected Date:   03/09/2024    Expiration Date:   03/09/2025   CMP (Cancer Center only)    Standing Status:   Future    Expected Date:   03/09/2024    Expiration Date:   03/09/2025   CBC with Differential (Cancer Center Only)    Standing Status:   Future    Expected Date:   03/23/2024    Expiration Date:   03/23/2025   CMP (Cancer Center only)    Standing Status:   Future    Expected Date:   03/23/2024    Expiration Date:   03/23/2025   All questions were answered. The patient knows to call the clinic with any problems, questions or concerns. No barriers to learning was detected. The total time spent in the appointment was 25 minutes.     Malachy Mood, MD 01/14/2024

## 2024-01-16 ENCOUNTER — Inpatient Hospital Stay: Payer: PPO

## 2024-01-16 ENCOUNTER — Other Ambulatory Visit: Payer: Self-pay

## 2024-01-16 ENCOUNTER — Telehealth: Payer: Self-pay | Admitting: *Deleted

## 2024-01-16 VITALS — BP 117/52 | HR 48 | Temp 98.8°F | Resp 17

## 2024-01-16 DIAGNOSIS — Z5111 Encounter for antineoplastic chemotherapy: Secondary | ICD-10-CM | POA: Diagnosis not present

## 2024-01-16 DIAGNOSIS — Z95828 Presence of other vascular implants and grafts: Secondary | ICD-10-CM

## 2024-01-16 MED ORDER — HEPARIN SOD (PORK) LOCK FLUSH 100 UNIT/ML IV SOLN
500.0000 [IU] | Freq: Once | INTRAVENOUS | Status: AC
Start: 2024-01-16 — End: 2024-01-16
  Administered 2024-01-16: 500 [IU]

## 2024-01-16 MED ORDER — SODIUM CHLORIDE 0.9% FLUSH
10.0000 mL | Freq: Once | INTRAVENOUS | Status: AC
  Administered 2024-01-16: 10 mL

## 2024-01-16 NOTE — Telephone Encounter (Signed)
 Called pt to see how she was doing on her new treatment.  She reports doing well & denies any problems.  She had several questions that were answered.  She is coming today to get pump taken off.  Encouraged to call with any concerns.

## 2024-01-16 NOTE — Telephone Encounter (Signed)
-----   Message from Nurse Barbara Cower E sent at 01/14/2024  4:22 PM EST ----- Regarding: first time chemo call back- fulfox feng Patient received fulfox for the first time she tolerated treatment well with no issues.

## 2024-01-19 ENCOUNTER — Ambulatory Visit
Admission: RE | Admit: 2024-01-19 | Discharge: 2024-01-19 | Disposition: A | Discharge status: Home / Self Care | Payer: PPO | Source: Ambulatory Visit | Attending: Internal Medicine | Admitting: Internal Medicine

## 2024-01-19 NOTE — Progress Notes (Signed)
  Radiation Oncology         [phone] ________________________________  Name: Samantha Jenkins MRN: [id number]  Date of Service: 01/19/2024  DOB: May 16, 1941  Post Treatment Telephone Note  Diagnosis:  Stage IIIB, cT3N1M0, adenocarcinoma of the rectum. (as documented in provider EOT note)  The patient was available for call today.   Symptoms of fatigue have improved since completing therapy.  Symptoms of skin changes have improved since completing therapy.   Symptoms of bladder changes have improved since completing therapy.  Symptoms of bowel changes have improved since completing therapy.  The patient has scheduled follow up with her medical oncologist [name] and their surgeon Dr. Maisie Fus for ongoing surveillance. She was encouraged to call if she develops concerns or questions regarding radiation.   This concludes the interaction.  Ruel Favors, LPN

## 2024-01-27 ENCOUNTER — Other Ambulatory Visit: Payer: Self-pay

## 2024-01-27 ENCOUNTER — Inpatient Hospital Stay: Payer: PPO | Attending: Radiation Oncology

## 2024-01-27 ENCOUNTER — Encounter: Payer: Self-pay | Admitting: Hematology

## 2024-01-27 ENCOUNTER — Inpatient Hospital Stay: Payer: PPO

## 2024-01-27 ENCOUNTER — Inpatient Hospital Stay (HOSPITAL_BASED_OUTPATIENT_CLINIC_OR_DEPARTMENT_OTHER): Payer: PPO | Admitting: Hematology

## 2024-01-27 VITALS — BP 124/57 | HR 52 | Temp 98.5°F | Resp 18 | Wt 171.2 lb

## 2024-01-27 DIAGNOSIS — C20 Malignant neoplasm of rectum: Secondary | ICD-10-CM | POA: Diagnosis not present

## 2024-01-27 DIAGNOSIS — Z5111 Encounter for antineoplastic chemotherapy: Secondary | ICD-10-CM | POA: Insufficient documentation

## 2024-01-27 DIAGNOSIS — Z95828 Presence of other vascular implants and grafts: Secondary | ICD-10-CM

## 2024-01-27 LAB — CBC WITH DIFFERENTIAL (CANCER CENTER ONLY)
Abs Immature Granulocytes: 0.01 10*3/uL (ref 0.00–0.07)
Basophils Absolute: 0 10*3/uL (ref 0.0–0.1)
Basophils Relative: 0 %
Eosinophils Absolute: 0.1 10*3/uL (ref 0.0–0.5)
Eosinophils Relative: 3 %
HCT: 33 % — ABNORMAL LOW (ref 36.0–46.0)
Hemoglobin: 11.4 g/dL — ABNORMAL LOW (ref 12.0–15.0)
Immature Granulocytes: 0 %
Lymphocytes Relative: 15 %
Lymphs Abs: 0.6 10*3/uL — ABNORMAL LOW (ref 0.7–4.0)
MCH: 33.3 pg (ref 26.0–34.0)
MCHC: 34.5 g/dL (ref 30.0–36.0)
MCV: 96.5 fL (ref 80.0–100.0)
Monocytes Absolute: 0.4 10*3/uL (ref 0.1–1.0)
Monocytes Relative: 9 %
Neutro Abs: 3.1 10*3/uL (ref 1.7–7.7)
Neutrophils Relative %: 73 %
Platelet Count: 147 10*3/uL — ABNORMAL LOW (ref 150–400)
RBC: 3.42 MIL/uL — ABNORMAL LOW (ref 3.87–5.11)
RDW: 15.2 % (ref 11.5–15.5)
WBC Count: 4.2 10*3/uL (ref 4.0–10.5)
nRBC: 0 % (ref 0.0–0.2)

## 2024-01-27 LAB — CMP (CANCER CENTER ONLY)
ALT: 10 U/L (ref 0–44)
AST: 20 U/L (ref 15–41)
Albumin: 4 g/dL (ref 3.5–5.0)
Alkaline Phosphatase: 107 U/L (ref 38–126)
Anion gap: 7 (ref 5–15)
BUN: 16 mg/dL (ref 8–23)
CO2: 28 mmol/L (ref 22–32)
Calcium: 9.3 mg/dL (ref 8.9–10.3)
Chloride: 108 mmol/L (ref 98–111)
Creatinine: 0.99 mg/dL (ref 0.44–1.00)
GFR, Estimated: 57 mL/min — ABNORMAL LOW (ref >60)
Glucose, Bld: 99 mg/dL (ref 70–99)
Potassium: 4.1 mmol/L (ref 3.5–5.1)
Sodium: 143 mmol/L (ref 135–145)
Total Bilirubin: 0.8 mg/dL (ref 0.0–1.2)
Total Protein: 6.5 g/dL (ref 6.5–8.1)

## 2024-01-27 MED ORDER — PALONOSETRON HCL INJECTION 0.25 MG/5ML
0.2500 mg | Freq: Once | INTRAVENOUS | Status: AC
  Administered 2024-01-27: 0.25 mg via INTRAVENOUS
  Filled 2024-01-27: qty 5

## 2024-01-27 MED ORDER — OXALIPLATIN CHEMO INJECTION 100 MG/20ML
75.0000 mg/m2 | Freq: Once | INTRAVENOUS | Status: AC
  Administered 2024-01-27: 150 mg via INTRAVENOUS
  Filled 2024-01-27: qty 10

## 2024-01-27 MED ORDER — SODIUM CHLORIDE 0.9 % IV SOLN
2400.0000 mg/m2 | INTRAVENOUS | Status: DC
  Administered 2024-01-27: 5000 mg via INTRAVENOUS
  Filled 2024-01-27: qty 100

## 2024-01-27 MED ORDER — OXALIPLATIN CHEMO INJECTION 100 MG/20ML
60.0000 mg/m2 | Freq: Once | INTRAVENOUS | Status: DC
Start: 2024-01-27 — End: 2024-01-27

## 2024-01-27 MED ORDER — DEXAMETHASONE SODIUM PHOSPHATE 10 MG/ML IJ SOLN
10.0000 mg | Freq: Once | INTRAMUSCULAR | Status: AC
  Administered 2024-01-27: 10 mg via INTRAVENOUS
  Filled 2024-01-27: qty 1

## 2024-01-27 MED ORDER — SODIUM CHLORIDE 0.9% FLUSH
10.0000 mL | Freq: Once | INTRAVENOUS | Status: AC
  Administered 2024-01-27: 10 mL

## 2024-01-27 MED ORDER — LEUCOVORIN CALCIUM INJECTION 350 MG
400.0000 mg/m2 | Freq: Once | INTRAVENOUS | Status: AC
  Administered 2024-01-27: 756 mg via INTRAVENOUS
  Filled 2024-01-27: qty 37.8

## 2024-01-27 MED ORDER — DEXTROSE 5 % IV SOLN: INTRAVENOUS | Status: DC

## 2024-01-27 NOTE — Progress Notes (Signed)
 [facility]   [phone] Fax:([phone]   Clinic Follow up Note   Patient Care Team: Irven Coe, MD as PCP - General (Family Medicine) Malachy Mood, MD as Consulting Physician (Hematology and Oncology)  Date of Service:  01/27/2024  CHIEF COMPLAINT: f/u of rectal cancer  CURRENT THERAPY:  Adjuvant chemotherapy FOLFOX  Oncology History   Adenocarcinoma of rectum (HCC) -cT3bN1M0, stage IIIB, G2 -presented with rectal bleeding.  I reviewed her recent colonoscopy findings, biopsy results, and her staging CT images.  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  -pelvic MRI 10/24/2023 showed  -pt was seen by surgeon Dr. Maisie Fus and rad/onc Dr. Mitzi Hansen -she agrees with TNT, and started concurrent chemoRT on 11/03/2023 and completed on 12/17/2022 -she started chemo FOLFOX on 01/14/2024, plan for for 8 cycles      Assessment and Plan    Rectal cancer   Undergoing neoadjuvant chemotherapy for rectal cancer, she tolerates treatment well without significant side effects such as nausea or vomiting. No adverse effects from the previous chemotherapy dose were reported, and she agrees to a slight increase in dosage to assess tolerance. She is informed about potential cold sensitivity associated with chemotherapy and advised to avoid cold foods and drinks during treatment.   - Increase chemotherapy dosage slightly to assess tolerance   - Advise to avoid cold foods and drinks during chemotherapy due to potential cold sensitivity   - Provide premedication to manage side effects of chemotherapy   - Schedule follow-up appointment in two weeks    Chemotherapy medication management   She inquired about the disposal of leftover chemotherapy pills from her radiation treatment. She has approximately ten pills remaining and was advised on proper disposal methods.   - Advise that leftover chemotherapy pills can be flushed down the toilet if she wishes  to dispose of them     PLAN -lab reviewed, will proceed with cycle 2 FOLFOX with slight dose increase of oxaliplatin  -f/u in 2 week before cycle 3 chemo      SUMMARY OF ONCOLOGIC HISTORY: Oncology History  Adenocarcinoma of rectum (HCC)  09/24/2023 Imaging   CT chest abdomen and pelvis with contrast  IMPRESSION: 1. Rectal primary with nodal metastasis in the mesorectum and sigmoid mesocolon. 2. No distant metastasis. 3. Chronic left-sided hydronephrosis with overlying cortical thinning and decreased/absent left renal function. Favor chronic ureteropelvic junction obstruction, given absence of hydroureter. 4. 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 5. Incidental findings, including: Coronary artery atherosclerosis. Aortic Atherosclerosis (ICD10-I70.0). Cholelithiasis. Bilateral nephrolithiasis.   10/02/2023 Initial Diagnosis   Adenocarcinoma of rectum (HCC)   10/24/2023 Cancer Staging   Staging form: Colon and Rectum, AJCC 8th Edition - Clinical stage from 10/24/2023: Stage IIIB (cT3, cN1, cM0) - Signed by Malachy Mood, MD on 10/31/2023 Histologic grade (G): G2 Histologic grading system: 4 grade system   10/29/2023 Imaging   Pelvic MRI FINDINGS: TUMOR LOCATION  Tumor distance from Anal Verge/Skin surface: 11.1 cm.  Tumor distance to Internal Anal sphincter: 7.4 cm.  TUMOR DESCRIPTION  Circumferential extent: 9 to 6 o'clock position, 75% of the total circumference, along the superior aspect.   Tumor Size and volume: 4.6 cm craniocaudal. Up to 1.9 cm in thickness.   T - CATEGORY   Extension through Muscularis Propria: Yes 1-34mm=T3b (approximately 1.6-1.8 mm.)   Shortest Distance of any tumor/node from Mesorectal fascia: 1.4 cm.   Extramural Vascular Invasion/Tumor Thrombus: No.  Invasion of Anterior Peritoneal Reflection: No.  Involvement of Adjacent Organs or Pelvic Sidewall: No.   Levator Ani Involvement: No.   N -  CATEGORY   Mesorectal Lymph Nodes >=72mm: 1-3=N1   Extra-mesorectal Lymphadenopathy: No   Other: There are multiple sigmoid diverticula without imaging signs diverticulitis. Urinary bladder is decompressed. Surgically absent uterus. No large adnexal mass seen.   IMPRESSION: Rectal adenocarcinoma T stage: T3b   Rectal adenocarcinoma N stage: N1   Distance from tumor to the internal anal sphincter is 7.4 cm.       01/14/2024 -  Chemotherapy   Patient is on Treatment Plan : COLORECTAL FOLFOX q14d x 4 months        Discussed the use of AI scribe software for clinical note transcription with the patient, who gave verbal consent to proceed.  History of Present Illness   The patient, with a history of rectal cancer, presents for follow-up after starting new advent chemotherapy. She reports feeling 'dazed and confused' due to scheduling issues and communication difficulties with the office. She had questions about the use of lidocaine, which were not addressed in a timely manner. Despite these issues, she reports tolerating the chemotherapy well, with no episodes of nausea or vomiting. She also mentions having leftover chemotherapy pills from her radiation treatment, which she is unsure how to dispose of.         All other systems were reviewed with the patient and are negative.  MEDICAL HISTORY:  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   Depression    Dysthymia    FHx: cardiovascular disease    History of blood transfusion as child   History of kidney stones    Hyperlipidemia    Hypertension    LBBB (left bundle branch block) 2014   on ekg   Obesity    Pneumonia    hx of as a child   Vitamin D deficiency     SURGICAL HISTORY: Past Surgical History:  Procedure Laterality Date   ABDOMINAL HYSTERECTOMY  1986   partial   APPENDECTOMY  1986    with hysterectomy   CATARACT EXTRACTION Bilateral 2016   CYSTOSCOPY/URETEROSCOPY/HOLMIUM LASER/STENT PLACEMENT Left 02/09/2021   Procedure: LEFT URETEROSCOPY/ LASER LITHOTRIPSY  AND  STENT PLACEMENT,ENDOPYLOTOMY;  Surgeon: Crist Fat, MD;  Location: [facility];  Service: Urology;  Laterality: Left;   CYSTOSCOPY/URETEROSCOPY/HOLMIUM LASER/STENT PLACEMENT Left 03/15/2021   Procedure: CYSTOSCOPY, LEFT URETEROSCOPY, HOLMIUM LASER ENDOPYELOTOMY, URETERAL STENT PLACEMENT;  Surgeon: Crist Fat, MD;  Location: [facility];  Service: Urology;  Laterality: Left;   EYE SURGERY  2016   bil cataracts   HOLMIUM LASER APPLICATION Left 02/09/2021   Procedure: HOLMIUM LASER APPLICATION;  Surgeon: Crist Fat, MD;  Location: [facility];  Service: Urology;  Laterality: Left;   KNEE ARTHROSCOPY  1995   left   NEPHROLITHOTOMY Left 03/23/2020   Procedure: LEFT NEPHROLITHOTOMY PERCUTANEOUS WITH SURGEON ACCESS;  Surgeon: Crist Fat, MD;  Location: [facility];  Service: Urology;  Laterality: Left;   PORTACATH PLACEMENT N/A 01/07/2024   Procedure: PORT PLACEMENT WITH ULTRASOUND GUIDANCE;  Surgeon: Romie Levee, MD;  Location: [facility];  Service: General;  Laterality: N/A;   TONSILLECTOMY  1945   adenoids removed   TOTAL HIP ARTHROPLASTY Left  06/03/2023   Procedure: TOTAL HIP ARTHROPLASTY ANTERIOR APPROACH;  Surgeon: Durene Romans, MD;  Location: [facility];  Service: Orthopedics;  Laterality: Left;   TOTAL KNEE ARTHROPLASTY Right 06/06/2020   Procedure: TOTAL KNEE ARTHROPLASTY;  Surgeon: Durene Romans, MD;  Location: [facility];  Service: Orthopedics;  Laterality: Right;  70 mins   TOTAL KNEE ARTHROPLASTY Left 07/17/2021   Procedure: TOTAL KNEE ARTHROPLASTY;  Surgeon: Durene Romans, MD;  Location: [facility];  Service: Orthopedics;  Laterality: Left;   TUBAL LIGATION  1979    I have reviewed the social history and family history with the patient and they are unchanged from  previous note.  ALLERGIES:  is allergic to statins and sulfa antibiotics.  MEDICATIONS:  Current Outpatient Medications  Medication Sig Dispense Refill   albuterol (PROVENTIL HFA;VENTOLIN HFA) 108 (90 BASE) MCG/ACT inhaler Inhale 2 puffs into the lungs every 6 (six) hours as needed for wheezing or shortness of breath. 1 Inhaler 0   bacitracin-polymyxin b (POLYSPORIN) ointment Apply 1 application topically daily as needed (wound care).     bisoprolol-hydrochlorothiazide (ZIAC) 5-6.25 MG tablet TAKE 1 TABLET BY MOUTH DAILY. (Patient taking differently: Take 1 tablet by mouth at bedtime.) 90 tablet 0   diclofenac Sodium (VOLTAREN) 1 % GEL Apply 1 Application topically 2 (two) times daily as needed (pain.).     Emollient (UDDERLY SMOOTH EX) Apply 1 Application topically as needed (dry/irritated skin).     lidocaine-prilocaine (EMLA) cream Apply to affected area once 30 g 3   ondansetron (ZOFRAN) 8 MG tablet Take 1 tablet (8 mg total) by mouth every 8 (eight) hours as needed for nausea or vomiting. Start on the third day after chemotherapy. 30 tablet 1   potassium chloride (KLOR-CON M) 10 MEQ tablet Take 1 tablet (10 mEq total) by mouth daily. 30 tablet 1   pravastatin (PRAVACHOL) 40 MG tablet TAKE 1 TABLET EVERY DAY (Patient taking differently: Take 40 mg by mouth every evening.) 90 tablet 0   prochlorperazine (COMPAZINE) 10 MG tablet Take 1 tablet (10 mg total) by mouth every 6 (six) hours as needed for nausea or vomiting. 30 tablet 1   Propylene Glycol, PF, (SYSTANE COMPLETE PF) 0.6 % SOLN Place 1 drop into both eyes daily.     sertraline (ZOLOFT) 100 MG tablet TAKE 1 TABLET EVERY DAY (Patient taking differently: Take 150 mg by mouth at bedtime.) 90 tablet 0   traZODone (DESYREL) 50 MG tablet TAKE ONE TABLET BY MOUTH ONCE a DAY at bedtime AS NEEDED     No current facility-administered medications for this visit.   Facility-Administered Medications Ordered in Other Visits  Medication Dose Route  Frequency Provider Last Rate Last Admin   dextrose 5 % solution   Intravenous Continuous Malachy Mood, MD 10 mL/hr at 01/27/24 1237 New Bag at 01/27/24 1237   fluorouracil (ADRUCIL) 5,000 mg in sodium chloride 0.9 % 150 mL chemo infusion  2,400 mg/m2 (Treatment Plan Recorded) Intravenous 1 day or 1 dose Malachy Mood, MD       leucovorin 756 mg in dextrose 5 % 250 mL infusion  400 mg/m2 (Treatment Plan Recorded) Intravenous Once Malachy Mood, MD       oxaliplatin (ELOXATIN) 115 mg in dextrose 5 % 500 mL chemo infusion  60 mg/m2 (Treatment Plan Recorded) Intravenous Once Malachy Mood, MD        PHYSICAL EXAMINATION: ECOG PERFORMANCE STATUS: 1 - Symptomatic but completely ambulatory  There were no vitals filed for this visit. Wt Readings from  Last 3 Encounters:  01/27/24 171 lb 3.2 oz (77.7 kg)  12/25/23 170 lb 10.2 oz (77.4 kg)  12/17/23 170 lb 12.8 oz (77.5 kg)     GENERAL:alert, no distress and comfortable SKIN: skin color, texture, turgor are normal, no rashes or significant lesions EYES: normal, Conjunctiva are pink and non-injected, sclera clear NECK: supple, thyroid normal size, non-tender, without nodularity LYMPH:  no palpable lymphadenopathy in the cervical, axillary  LUNGS: clear to auscultation and percussion with normal breathing effort HEART: regular rate & rhythm and no murmurs and no lower extremity edema ABDOMEN:abdomen soft, non-tender and normal bowel sounds Musculoskeletal:no cyanosis of digits and no clubbing  NEURO: alert & oriented x 3 with fluent speech, no focal motor/sensory deficits    LABORATORY DATA:  I have reviewed the data as listed    Latest Ref Rng & Units 01/27/2024   11:39 AM 01/13/2024    2:58 PM 12/17/2023    1:20 PM  CBC  WBC 4.0 - 10.5 K/uL 4.2  4.0  3.0   Hemoglobin 12.0 - 15.0 g/dL 40.9  81.1  91.4   Hematocrit 36.0 - 46.0 % 33.0  35.3  34.3   Platelets 150 - 400 K/uL 147  170  129         Latest Ref Rng & Units 01/27/2024   11:39 AM 01/13/2024     2:58 PM 12/17/2023    1:20 PM  CMP  Glucose 70 - 99 mg/dL 99  782  956   BUN 8 - 23 mg/dL 16  12  11    Creatinine 0.44 - 1.00 mg/dL 2.13  0.86  5.78   Sodium 135 - 145 mmol/L 143  142  144   Potassium 3.5 - 5.1 mmol/L 4.1  3.8  3.3   Chloride 98 - 111 mmol/L 108  108  109   CO2 22 - 32 mmol/L 28  29  28    Calcium 8.9 - 10.3 mg/dL 9.3  9.3  9.5   Total Protein 6.5 - 8.1 g/dL 6.5  6.7  6.4   Total Bilirubin 0.0 - 1.2 mg/dL 0.8  0.6  1.0   Alkaline Phos 38 - 126 U/L 107  103  111   AST 15 - 41 U/L 20  19  20    ALT 0 - 44 U/L 10  8  8        RADIOGRAPHIC STUDIES: I have personally reviewed the radiological images as listed and agreed with the findings in the report. No results found.    No orders of the defined types were placed in this encounter.  All questions were answered. The patient knows to call the clinic with any problems, questions or concerns. No barriers to learning was detected. The total time spent in the appointment was 25 minutes.     Malachy Mood, MD 01/27/2024

## 2024-01-27 NOTE — Patient Instructions (Signed)
 [facility] - A DEPT OF [facility]  Discharge Instructions: Thank you for choosing [facility] to provide your oncology and hematology care.   If you have a lab appointment with the Cancer Center, please go directly to the Cancer Center and check in at the registration area.   Wear comfortable clothing and clothing appropriate for easy access to any Portacath or PICC line.   We strive to give you quality time with your provider. You may need to reschedule your appointment if you arrive late (15 or more minutes).  Arriving late affects you and other patients whose appointments are after yours.  Also, if you miss three or more appointments without notifying the office, you may be dismissed from the clinic at the provider's discretion.      For prescription refill requests, have your pharmacy contact our office and allow 72 hours for refills to be completed.    Today you received the following chemotherapy and/or immunotherapy agents oxaliplatin, lucovorin, adrucil       To help prevent nausea and vomiting after your treatment, we encourage you to take your nausea medication as directed.  BELOW ARE SYMPTOMS THAT SHOULD BE REPORTED IMMEDIATELY: *FEVER GREATER THAN 100.4 F (38 C) OR HIGHER *CHILLS OR SWEATING *NAUSEA AND VOMITING THAT IS NOT CONTROLLED WITH YOUR NAUSEA MEDICATION *UNUSUAL SHORTNESS OF BREATH *UNUSUAL BRUISING OR BLEEDING *URINARY PROBLEMS (pain or burning when urinating, or frequent urination) *BOWEL PROBLEMS (unusual diarrhea, constipation, pain near the anus) TENDERNESS IN MOUTH AND THROAT WITH OR WITHOUT PRESENCE OF ULCERS (sore throat, sores in mouth, or a toothache) UNUSUAL RASH, SWELLING OR PAIN  UNUSUAL VAGINAL DISCHARGE OR ITCHING   Items with * indicate a potential emergency and should be followed up as soon as possible or go to the Emergency Department if any problems should occur.  Please show the CHEMOTHERAPY ALERT  CARD or IMMUNOTHERAPY ALERT CARD at check-in to the Emergency Department and triage nurse.  Should you have questions after your visit or need to cancel or reschedule your appointment, please contact [facility] - A DEPT OF [facility]  Dept: [phone]  and follow the prompts.  Office hours are 8:00 a.m. to 4:30 p.m. Monday - Friday. Please note that voicemails left after 4:00 p.m. may not be returned until the following business day.  We are closed weekends and major holidays. You have access to a nurse at all times for urgent questions. Please call the main number to the clinic Dept: [phone] and follow the prompts.   For any non-urgent questions, you may also contact your provider using MyChart. We now offer e-Visits for anyone 24 and older to request care online for non-urgent symptoms. For details visit [email or web address].   Also download the MyChart app! Go to the app store, search "MyChart", open the app, select [facility], and log in with your MyChart username and password.

## 2024-01-27 NOTE — Progress Notes (Signed)
 Per [name], pt tolerated tx and would like to increase Oxaliplatin dose going forward to 75 mg/m2.  Tx plan updated accordingly.  Ebony Hail, Pharm.D., CPP 01/27/2024@12 :58 PM

## 2024-01-27 NOTE — Assessment & Plan Note (Signed)
-  [id number], stage IIIB, G2 -presented with rectal bleeding.  I reviewed her recent colonoscopy findings, biopsy results, and her staging CT images.  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  -pelvic MRI 10/24/2023 showed  -pt was seen by surgeon Dr. Maisie Fus and rad/onc Dr. Mitzi Hansen -she agrees with TNT, and started concurrent chemoRT on 11/03/2023 and completed on 12/17/2022 -she started chemo FOLFOX on 01/14/2024, plan for for 8 cycles

## 2024-01-29 ENCOUNTER — Inpatient Hospital Stay: Payer: PPO

## 2024-01-29 VITALS — BP 131/50 | HR 52 | Temp 97.9°F | Resp 16

## 2024-01-29 DIAGNOSIS — Z5111 Encounter for antineoplastic chemotherapy: Secondary | ICD-10-CM | POA: Diagnosis not present

## 2024-01-29 DIAGNOSIS — Z95828 Presence of other vascular implants and grafts: Secondary | ICD-10-CM

## 2024-01-29 MED ORDER — HEPARIN SOD (PORK) LOCK FLUSH 100 UNIT/ML IV SOLN
500.0000 [IU] | Freq: Once | INTRAVENOUS | Status: AC
  Administered 2024-01-29: 500 [IU]

## 2024-01-29 MED ORDER — SODIUM CHLORIDE 0.9% FLUSH
10.0000 mL | Freq: Once | INTRAVENOUS | Status: AC
  Administered 2024-01-29: 10 mL

## 2024-02-08 ENCOUNTER — Other Ambulatory Visit: Payer: Self-pay | Admitting: Nurse Practitioner

## 2024-02-08 DIAGNOSIS — C20 Malignant neoplasm of rectum: Secondary | ICD-10-CM

## 2024-02-08 NOTE — Progress Notes (Unsigned)
 Patient Care Team: Irven Coe, MD as PCP - General (Family Medicine) Malachy Mood, MD as Consulting Physician (Hematology and Oncology)  Clinic Day:  02/10/2024  Referring physician: Malachy Mood, MD  ASSESSMENT & PLAN:   Assessment & Plan: Adenocarcinoma of rectum (HCC) -cT3bN1M0, stage IIIB, G2 -presented with rectal bleeding.  Recent colonoscopy findings, biopsy results, and her staging CT images were reviewed. 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  -pelvic MRI 10/24/2023 showed Rectal adenocarcinoma T stage: T3b; Rectal adenocarcinoma N stage:  N1.   Distance from tumor to the internal anal sphincter is 7.4 cm. -pt was seen by surgeon Dr. Maisie Fus and rad/onc Dr. Mitzi Hansen -she agrees with TNT, and started concurrent chemoRT on 11/03/2023 and completed on 12/17/2022 -she started chemo FOLFOX on 01/14/2024, plan for for 8 cycles   -Oxaliplatin dose increased to 75 mg/m starting with cycle 2 on 01/26/2024.  She tolerated adjustment well. -02/10/2024 -plan to proceed with cycle 3 day 1 of colorectal FOLFOX with no dose adjustments.   Decreased appetite due to chemotherapy Discussed adding Ensure and/or Boost to increase protein and calorie intake.  She reports trying to snack more in between meals as she does get full very quickly.  Will consider revisiting dietitian more possibly adding appetite stimulant to medications if needed.  Diarrhea related to chemotherapy and rectal cancer Patient taking Imodium as needed.  She reports this is working well for her.  She has also increased water intake to prevent dehydration.  She knows to contact the clinic if Imodium becomes ineffective as there is prescription antidiarrheal medication available.  Plan: Labs reviewed.  Stable and mild cytopenias.  Mildly reduced platelet count. Labs and patient presentation are satisfactory for treatment today.   Will proceed with cycle 3 day 1 of colorectal FOLFOX.  Continue with  oxaliplatin at 75 mg/m. Labs/flush, follow-up, and treatment in 2 weeks as scheduled.  The patient understands the plans discussed today and is in agreement with them.  She knows to contact our office if she develops concerns prior to her next appointment.  I provided 25 minutes of face-to-face time during this encounter and > 50% was spent counseling as documented under my assessment and plan.    Carlean Jews, NP  [facility] - [facility] DEPT [facility] [address] [place] [address] Dept: [phone] Dept Fax: [phone]   No orders of the defined types were placed in this encounter.     CHIEF COMPLAINT:  CC: rectal cancer   Current Treatment:  adjuvant chemotherapy FOLFOX  INTERVAL HISTORY:  Samantha Jenkins is here today for repeat clinical assessment. Last seen by [name] 01/27/2024.  Increased dose Oxaliplatin at that time to 75 mg/m.  She states she tolerated increase in dosing very well.  She denies nausea and vomiting.  Her appetite is decreased.  She is trying to snack more often.  Plans to add Ensure to improve calorie and protein intake.  She is taking Imodium to help manage diarrhea.  She does have cold sensitivity in her hands directly after treatment.  She uses oven mitts to take things out of the freezer.  She states this gradually improves.  Today, is cycle 3 day 1 FOLFOX. She denies chest pain, chest pressure, or shortness of breath. She denies headaches or visual disturbances. He denies abdominal pain, nausea, vomiting, or changes in bladder habits.  She denies fevers or chills.  She denies pain. Her appetite is fair. Her weight has decreased 4 pounds over last 2 weeks .  I have reviewed the past medical history, past surgical history, social history and family history with the patient and they are unchanged from previous note.  ALLERGIES:  is allergic to statins and sulfa antibiotics.  MEDICATIONS:   Current Outpatient Medications  Medication Sig Dispense Refill   albuterol (PROVENTIL HFA;VENTOLIN HFA) 108 (90 BASE) MCG/ACT inhaler Inhale 2 puffs into the lungs every 6 (six) hours as needed for wheezing or shortness of breath. 1 Inhaler 0   bacitracin-polymyxin b (POLYSPORIN) ointment Apply 1 application topically daily as needed (wound care).     bisoprolol-hydrochlorothiazide (ZIAC) 5-6.25 MG tablet TAKE 1 TABLET BY MOUTH DAILY. (Patient taking differently: Take 1 tablet by mouth at bedtime.) 90 tablet 0   diclofenac Sodium (VOLTAREN) 1 % GEL Apply 1 Application topically 2 (two) times daily as needed (pain.).     Emollient (UDDERLY SMOOTH EX) Apply 1 Application topically as needed (dry/irritated skin).     lidocaine-prilocaine (EMLA) cream Apply to affected area once 30 g 3   pravastatin (PRAVACHOL) 40 MG tablet TAKE 1 TABLET EVERY DAY (Patient taking differently: Take 40 mg by mouth every evening.) 90 tablet 0   Propylene Glycol, PF, (SYSTANE COMPLETE PF) 0.6 % SOLN Place 1 drop into both eyes daily.     sertraline (ZOLOFT) 100 MG tablet TAKE 1 TABLET EVERY DAY (Patient taking differently: Take 150 mg by mouth at bedtime.) 90 tablet 0   traZODone (DESYREL) 50 MG tablet TAKE ONE TABLET BY MOUTH ONCE a DAY at bedtime AS NEEDED     No current facility-administered medications for this visit.   Facility-Administered Medications Ordered in Other Visits  Medication Dose Route Frequency Provider Last Rate Last Admin   dextrose 5 % solution   Intravenous Continuous Malachy Mood, MD   Stopped at 02/10/24 1237   fluorouracil (ADRUCIL) 5,000 mg in sodium chloride 0.9 % 150 mL chemo infusion  2,400 mg/m2 (Treatment Plan Recorded) Intravenous 1 day or 1 dose Malachy Mood, MD   Infusion Verify at 02/10/24 1257    HISTORY OF PRESENT ILLNESS:   Oncology History  Adenocarcinoma of rectum (HCC)  09/24/2023 Imaging   CT chest abdomen and pelvis with contrast  IMPRESSION: 1. Rectal primary with nodal  metastasis in the mesorectum and sigmoid mesocolon. 2. No distant metastasis. 3. Chronic left-sided hydronephrosis with overlying cortical thinning and decreased/absent left renal function. Favor chronic ureteropelvic junction obstruction, given absence of hydroureter. 4. 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 5. Incidental findings, including: Coronary artery atherosclerosis. Aortic Atherosclerosis (ICD10-I70.0). Cholelithiasis. Bilateral nephrolithiasis.   10/02/2023 Initial Diagnosis   Adenocarcinoma of rectum (HCC)   10/24/2023 Cancer Staging   Staging form: Colon and Rectum, AJCC 8th Edition - Clinical stage from 10/24/2023: Stage IIIB (cT3, cN1, cM0) - Signed by Malachy Mood, MD on 10/31/2023 Histologic grade (G): G2 Histologic grading system: 4 grade system   10/29/2023 Imaging   Pelvic MRI FINDINGS: TUMOR LOCATION  Tumor distance from Anal Verge/Skin surface: 11.1 cm.  Tumor distance to Internal Anal sphincter: 7.4 cm.  TUMOR DESCRIPTION  Circumferential extent: 9 to 6 o'clock position, 75% of the total circumference, along the superior aspect.   Tumor Size and volume: 4.6 cm craniocaudal. Up to 1.9 cm in thickness.   T - CATEGORY   Extension through Muscularis Propria: Yes 1-87mm=T3b (approximately 1.6-1.8 mm.)   Shortest Distance  of any tumor/node from Mesorectal fascia: 1.4 cm.   Extramural Vascular Invasion/Tumor Thrombus: No.   Invasion of Anterior Peritoneal Reflection: No.  Involvement of Adjacent Organs or Pelvic Sidewall: No.   Levator Ani Involvement: No.   N - CATEGORY   Mesorectal Lymph Nodes >=76mm: 1-3=N1   Extra-mesorectal Lymphadenopathy: No   Other: There are multiple sigmoid diverticula without imaging signs diverticulitis. Urinary bladder is decompressed. Surgically absent uterus. No large adnexal mass seen.   IMPRESSION: Rectal adenocarcinoma T stage: T3b   Rectal adenocarcinoma N stage: N1    Distance from tumor to the internal anal sphincter is 7.4 cm.       01/14/2024 -  Chemotherapy   Patient is on Treatment Plan : COLORECTAL FOLFOX q14d x 4 months         REVIEW OF SYSTEMS:   Constitutional: Denies fevers or chills.  She does have decreased appetite and has had a 4 pound weight loss since her last visit Eyes: Denies blurriness of vision Ears, nose, mouth, throat, and face: Denies mucositis or sore throat Respiratory: Denies cough, dyspnea or wheezes Cardiovascular: Denies palpitation, chest discomfort or lower extremity swelling Gastrointestinal:  Denies nausea, heartburn or change in bowel habits.  Skin: Denies abnormal skin rashes Lymphatics: Denies new lymphadenopathy or easy bruising Neurological:Denies numbness, tingling or new weaknesses Behavioral/Psych: Mood is stable, no new changes  All other systems were reviewed with the patient and are negative.   VITALS:   Today's Vitals   02/10/24 0828  BP: (!) 140/80  Pulse: 65  Resp: 20  Temp: 97.6 F (36.4 C)  TempSrc: Temporal  SpO2: 95%  Weight: 167 lb 3.2 oz (75.8 kg)  Height: 5\' 5"  (1.651 m)  PainSc: 0-No pain   Body mass index is 27.82 kg/m.   Wt Readings from Last 3 Encounters:  02/10/24 167 lb 3.2 oz (75.8 kg)  01/27/24 171 lb 3.2 oz (77.7 kg)  12/25/23 170 lb 10.2 oz (77.4 kg)    Body mass index is 27.82 kg/m.  Performance status (ECOG): 1 - Symptomatic but completely ambulatory  PHYSICAL EXAM:   GENERAL:alert, no distress and comfortable SKIN: skin color, texture, turgor are normal, no rashes or significant lesions EYES: normal, Conjunctiva are pink and non-injected, sclera clear OROPHARYNX:no exudate, no erythema and lips, buccal mucosa, and tongue normal  NECK: supple, thyroid normal size, non-tender, without nodularity LYMPH:  no palpable lymphadenopathy in the cervical, axillary or inguinal LUNGS: clear to auscultation and percussion with normal breathing effort HEART:  regular rate & rhythm and no murmurs and no lower extremity edema ABDOMEN:abdomen soft, non-tender and normal bowel sounds Musculoskeletal:no cyanosis of digits and no clubbing  NEURO: alert & oriented x 3 with fluent speech, no focal motor/sensory deficits  LABORATORY DATA:  I have reviewed the data as listed    Component Value Date/Time   NA 142 02/10/2024 0755   K 3.5 02/10/2024 0755   CL 105 02/10/2024 0755   CO2 29 02/10/2024 0755   GLUCOSE 98 02/10/2024 0755   BUN 11 02/10/2024 0755   CREATININE 1.08 (H) 02/10/2024 0755   CREATININE 0.71 03/30/2015 0951   CALCIUM 9.5 02/10/2024 0755   PROT 6.7 02/10/2024 0755   ALBUMIN 3.9 02/10/2024 0755   AST 24 02/10/2024 0755   ALT 11 02/10/2024 0755   ALKPHOS 95 02/10/2024 0755   BILITOT 0.7 02/10/2024 0755   GFRNONAA 51 (L) 02/10/2024 0755   GFRAA 46 (L) 06/07/2020 0304    Lab Results  Component  Value Date   WBC 3.4 (L) 02/10/2024   NEUTROABS 2.2 02/10/2024   HGB 11.6 (L) 02/10/2024   HCT 33.5 (L) 02/10/2024   MCV 96.5 02/10/2024   PLT 104 (L) 02/10/2024

## 2024-02-08 NOTE — Assessment & Plan Note (Addendum)
-  [id number], stage IIIB, G2 -presented with rectal bleeding.  Recent colonoscopy findings, biopsy results, and her staging CT images were reviewed. 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  -pelvic MRI 10/24/2023 showed Rectal adenocarcinoma T stage: T3b; Rectal adenocarcinoma N stage:  N1.   Distance from tumor to the internal anal sphincter is 7.4 cm. -pt was seen by surgeon Dr. Maisie Fus and rad/onc Dr. Mitzi Hansen -she agrees with TNT, and started concurrent chemoRT on 11/03/2023 and completed on 12/17/2022 -she started chemo FOLFOX on 01/14/2024, plan for for 8 cycles   -Oxaliplatin dose increased to 75 mg/m starting with cycle 2 on 01/26/2024.  She tolerated adjustment well. -02/10/2024 -plan to proceed with cycle 3 day 1 of colorectal FOLFOX with no dose adjustments.

## 2024-02-10 ENCOUNTER — Inpatient Hospital Stay: Payer: PPO

## 2024-02-10 ENCOUNTER — Encounter: Payer: Self-pay | Admitting: Nurse Practitioner

## 2024-02-10 ENCOUNTER — Inpatient Hospital Stay (HOSPITAL_BASED_OUTPATIENT_CLINIC_OR_DEPARTMENT_OTHER): Payer: PPO | Admitting: Nurse Practitioner

## 2024-02-10 VITALS — BP 140/80 | HR 65 | Temp 97.6°F | Resp 20 | Ht 65.0 in | Wt 167.2 lb

## 2024-02-10 DIAGNOSIS — Z95828 Presence of other vascular implants and grafts: Secondary | ICD-10-CM

## 2024-02-10 DIAGNOSIS — C20 Malignant neoplasm of rectum: Secondary | ICD-10-CM

## 2024-02-10 DIAGNOSIS — Z5111 Encounter for antineoplastic chemotherapy: Secondary | ICD-10-CM | POA: Diagnosis not present

## 2024-02-10 LAB — CBC WITH DIFFERENTIAL (CANCER CENTER ONLY)
Abs Immature Granulocytes: 0 10*3/uL (ref 0.00–0.07)
Basophils Absolute: 0 10*3/uL (ref 0.0–0.1)
Basophils Relative: 0 %
Eosinophils Absolute: 0.1 10*3/uL (ref 0.0–0.5)
Eosinophils Relative: 4 %
HCT: 33.5 % — ABNORMAL LOW (ref 36.0–46.0)
Hemoglobin: 11.6 g/dL — ABNORMAL LOW (ref 12.0–15.0)
Immature Granulocytes: 0 %
Lymphocytes Relative: 20 %
Lymphs Abs: 0.7 10*3/uL (ref 0.7–4.0)
MCH: 33.4 pg (ref 26.0–34.0)
MCHC: 34.6 g/dL (ref 30.0–36.0)
MCV: 96.5 fL (ref 80.0–100.0)
Monocytes Absolute: 0.4 10*3/uL (ref 0.1–1.0)
Monocytes Relative: 13 %
Neutro Abs: 2.2 10*3/uL (ref 1.7–7.7)
Neutrophils Relative %: 63 %
Platelet Count: 104 10*3/uL — ABNORMAL LOW (ref 150–400)
RBC: 3.47 MIL/uL — ABNORMAL LOW (ref 3.87–5.11)
RDW: 14.3 % (ref 11.5–15.5)
WBC Count: 3.4 10*3/uL — ABNORMAL LOW (ref 4.0–10.5)
nRBC: 0 % (ref 0.0–0.2)

## 2024-02-10 LAB — CMP (CANCER CENTER ONLY)
ALT: 11 U/L (ref 0–44)
AST: 24 U/L (ref 15–41)
Albumin: 3.9 g/dL (ref 3.5–5.0)
Alkaline Phosphatase: 95 U/L (ref 38–126)
Anion gap: 8 (ref 5–15)
BUN: 11 mg/dL (ref 8–23)
CO2: 29 mmol/L (ref 22–32)
Calcium: 9.5 mg/dL (ref 8.9–10.3)
Chloride: 105 mmol/L (ref 98–111)
Creatinine: 1.08 mg/dL — ABNORMAL HIGH (ref 0.44–1.00)
GFR, Estimated: 51 mL/min — ABNORMAL LOW (ref >60)
Glucose, Bld: 98 mg/dL (ref 70–99)
Potassium: 3.5 mmol/L (ref 3.5–5.1)
Sodium: 142 mmol/L (ref 135–145)
Total Bilirubin: 0.7 mg/dL (ref 0.0–1.2)
Total Protein: 6.7 g/dL (ref 6.5–8.1)

## 2024-02-10 LAB — CEA (ACCESS): CEA (CHCC): 3.47 ng/mL (ref 0.00–5.00)

## 2024-02-10 MED ORDER — SODIUM CHLORIDE 0.9 % IV SOLN
2400.0000 mg/m2 | INTRAVENOUS | Status: DC
  Administered 2024-02-10: 5000 mg via INTRAVENOUS
  Filled 2024-02-10: qty 100

## 2024-02-10 MED ORDER — DEXAMETHASONE SODIUM PHOSPHATE 10 MG/ML IJ SOLN
10.0000 mg | Freq: Once | INTRAMUSCULAR | Status: AC
  Administered 2024-02-10: 10 mg via INTRAVENOUS
  Filled 2024-02-10: qty 1

## 2024-02-10 MED ORDER — PALONOSETRON HCL INJECTION 0.25 MG/5ML
0.2500 mg | Freq: Once | INTRAVENOUS | Status: AC
  Administered 2024-02-10: 0.25 mg via INTRAVENOUS
  Filled 2024-02-10: qty 5

## 2024-02-10 MED ORDER — OXALIPLATIN CHEMO INJECTION 100 MG/20ML
75.0000 mg/m2 | Freq: Once | INTRAVENOUS | Status: AC
  Administered 2024-02-10: 150 mg via INTRAVENOUS
  Filled 2024-02-10: qty 10

## 2024-02-10 MED ORDER — DEXTROSE 5 % IV SOLN: INTRAVENOUS | Status: DC

## 2024-02-10 MED ORDER — LEUCOVORIN CALCIUM INJECTION 350 MG
400.0000 mg/m2 | Freq: Once | INTRAVENOUS | Status: AC
  Administered 2024-02-10: 756 mg via INTRAVENOUS
  Filled 2024-02-10: qty 25

## 2024-02-10 MED ORDER — SODIUM CHLORIDE 0.9% FLUSH
10.0000 mL | Freq: Once | INTRAVENOUS | Status: AC
  Administered 2024-02-10: 10 mL

## 2024-02-10 NOTE — Patient Instructions (Signed)
 [facility]  Discharge Instructions: Thank you for choosing [facility] to provide your oncology and hematology care.   If you have a lab appointment with the Cancer Center, please go directly to the Cancer Center and check in at the registration area.   Wear comfortable clothing and clothing appropriate for easy access to any Portacath or PICC line.   We strive to give you quality time with your provider. You may need to reschedule your appointment if you arrive late (15 or more minutes).  Arriving late affects you and other patients whose appointments are after yours.  Also, if you miss three or more appointments without notifying the office, you may be dismissed from the clinic at the provider's discretion.      For prescription refill requests, have your pharmacy contact our office and allow 72 hours for refills to be completed.    Today you received the following chemotherapy and/or immunotherapy agents: Oxaliplatin & Fluorouracil.       To help prevent nausea and vomiting after your treatment, we encourage you to take your nausea medication as directed.  BELOW ARE SYMPTOMS THAT SHOULD BE REPORTED IMMEDIATELY: *FEVER GREATER THAN 100.4 F (38 C) OR HIGHER *CHILLS OR SWEATING *NAUSEA AND VOMITING THAT IS NOT CONTROLLED WITH YOUR NAUSEA MEDICATION *UNUSUAL SHORTNESS OF BREATH *UNUSUAL BRUISING OR BLEEDING *URINARY PROBLEMS (pain or burning when urinating, or frequent urination) *BOWEL PROBLEMS (unusual diarrhea, constipation, pain near the anus) TENDERNESS IN MOUTH AND THROAT WITH OR WITHOUT PRESENCE OF ULCERS (sore throat, sores in mouth, or a toothache) UNUSUAL RASH, SWELLING OR PAIN  UNUSUAL VAGINAL DISCHARGE OR ITCHING   Items with * indicate a potential emergency and should be followed up as soon as possible or go to the Emergency Department if any problems should occur.  Please show the CHEMOTHERAPY ALERT  [place] or IMMUNOTHERAPY ALERT Ryer at check-in to the Emergency Department and triage nurse.  Should you have questions after your visit or need to cancel or reschedule your appointment, please contact [facility]  Dept: [phone]  and follow the prompts.  Office hours are 8:00 a.m. to 4:30 p.m. Monday - Friday. Please note that voicemails left after 4:00 p.m. may not be returned until the following business day.  We are closed weekends and major holidays. You have access to a nurse at all times for urgent questions. Please call the main number to the clinic Dept: [phone] and follow the prompts.   For any non-urgent questions, you may also contact your provider using MyChart. We now offer e-Visits for anyone 72 and older to request care online for non-urgent symptoms. For details visit [email or web address].   Also download the MyChart app! Go to the app store, search "MyChart", open the app, select [place], and log in with your MyChart username and password.

## 2024-02-11 DIAGNOSIS — C20 Malignant neoplasm of rectum: Secondary | ICD-10-CM | POA: Diagnosis not present

## 2024-02-12 ENCOUNTER — Inpatient Hospital Stay: Payer: PPO

## 2024-02-12 VITALS — BP 141/59 | HR 51 | Temp 98.4°F | Resp 20

## 2024-02-12 DIAGNOSIS — Z5111 Encounter for antineoplastic chemotherapy: Secondary | ICD-10-CM | POA: Diagnosis not present

## 2024-02-12 DIAGNOSIS — Z95828 Presence of other vascular implants and grafts: Secondary | ICD-10-CM

## 2024-02-12 MED ORDER — HEPARIN SOD (PORK) LOCK FLUSH 100 UNIT/ML IV SOLN
500.0000 [IU] | Freq: Once | INTRAVENOUS | Status: AC
  Administered 2024-02-12: 500 [IU]

## 2024-02-12 MED ORDER — SODIUM CHLORIDE 0.9% FLUSH
10.0000 mL | Freq: Once | INTRAVENOUS | Status: AC
  Administered 2024-02-12: 10 mL

## 2024-02-23 NOTE — Assessment & Plan Note (Signed)
-  [id number], stage IIIB, G2 -presented with rectal bleeding.  I reviewed her recent colonoscopy findings, biopsy results, and her staging CT images.  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  -pelvic MRI 10/24/2023 showed  -pt was seen by surgeon Dr. Maisie Fus and rad/onc Dr. Mitzi Hansen -she agrees with TNT, and started concurrent chemoRT on 11/03/2023 and completed on 12/17/2022 -she started chemo FOLFOX on 01/14/2024, plan for for 8 cycles

## 2024-02-24 ENCOUNTER — Encounter: Payer: Self-pay | Admitting: Hematology

## 2024-02-24 ENCOUNTER — Inpatient Hospital Stay: Payer: PPO

## 2024-02-24 ENCOUNTER — Inpatient Hospital Stay: Payer: PPO | Attending: Radiation Oncology

## 2024-02-24 ENCOUNTER — Inpatient Hospital Stay: Payer: PPO | Attending: Radiation Oncology | Admitting: Hematology

## 2024-02-24 VITALS — BP 138/72 | HR 51 | Temp 97.6°F | Resp 18 | Ht 65.0 in | Wt 166.3 lb

## 2024-02-24 DIAGNOSIS — Z79631 Long term (current) use of antimetabolite agent: Secondary | ICD-10-CM | POA: Diagnosis not present

## 2024-02-24 DIAGNOSIS — Z5111 Encounter for antineoplastic chemotherapy: Secondary | ICD-10-CM | POA: Insufficient documentation

## 2024-02-24 DIAGNOSIS — C20 Malignant neoplasm of rectum: Secondary | ICD-10-CM | POA: Insufficient documentation

## 2024-02-24 DIAGNOSIS — Z95828 Presence of other vascular implants and grafts: Secondary | ICD-10-CM

## 2024-02-24 DIAGNOSIS — Z79630 Long term (current) use of alkylating agent: Secondary | ICD-10-CM | POA: Insufficient documentation

## 2024-02-24 LAB — CBC WITH DIFFERENTIAL (CANCER CENTER ONLY)
Abs Immature Granulocytes: 0 10*3/uL (ref 0.00–0.07)
Basophils Absolute: 0 10*3/uL (ref 0.0–0.1)
Basophils Relative: 0 %
Eosinophils Absolute: 0.1 10*3/uL (ref 0.0–0.5)
Eosinophils Relative: 4 %
HCT: 32.8 % — ABNORMAL LOW (ref 36.0–46.0)
Hemoglobin: 11 g/dL — ABNORMAL LOW (ref 12.0–15.0)
Immature Granulocytes: 0 %
Lymphocytes Relative: 16 %
Lymphs Abs: 0.5 10*3/uL — ABNORMAL LOW (ref 0.7–4.0)
MCH: 32.9 pg (ref 26.0–34.0)
MCHC: 33.5 g/dL (ref 30.0–36.0)
MCV: 98.2 fL (ref 80.0–100.0)
Monocytes Absolute: 0.4 10*3/uL (ref 0.1–1.0)
Monocytes Relative: 14 %
Neutro Abs: 1.8 10*3/uL (ref 1.7–7.7)
Neutrophils Relative %: 66 %
Platelet Count: 96 10*3/uL — ABNORMAL LOW (ref 150–400)
RBC: 3.34 MIL/uL — ABNORMAL LOW (ref 3.87–5.11)
RDW: 14.3 % (ref 11.5–15.5)
WBC Count: 2.8 10*3/uL — ABNORMAL LOW (ref 4.0–10.5)
nRBC: 0 % (ref 0.0–0.2)

## 2024-02-24 LAB — CMP (CANCER CENTER ONLY)
ALT: 9 U/L (ref 0–44)
AST: 22 U/L (ref 15–41)
Albumin: 3.8 g/dL (ref 3.5–5.0)
Alkaline Phosphatase: 95 U/L (ref 38–126)
Anion gap: 5 (ref 5–15)
BUN: 14 mg/dL (ref 8–23)
CO2: 29 mmol/L (ref 22–32)
Calcium: 9.5 mg/dL (ref 8.9–10.3)
Chloride: 109 mmol/L (ref 98–111)
Creatinine: 0.92 mg/dL (ref 0.44–1.00)
GFR, Estimated: >60 mL/min (ref >60)
Glucose, Bld: 113 mg/dL — ABNORMAL HIGH (ref 70–99)
Potassium: 3.4 mmol/L — ABNORMAL LOW (ref 3.5–5.1)
Sodium: 143 mmol/L (ref 135–145)
Total Bilirubin: 0.5 mg/dL (ref 0.0–1.2)
Total Protein: 6.7 g/dL (ref 6.5–8.1)

## 2024-02-24 LAB — CEA (ACCESS): CEA (CHCC): 4.08 ng/mL (ref 0.00–5.00)

## 2024-02-24 MED ORDER — OXALIPLATIN CHEMO INJECTION 100 MG/20ML
75.0000 mg/m2 | Freq: Once | INTRAVENOUS | Status: AC
  Administered 2024-02-24: 150 mg via INTRAVENOUS
  Filled 2024-02-24: qty 10

## 2024-02-24 MED ORDER — SODIUM CHLORIDE 0.9 % IV SOLN
2400.0000 mg/m2 | INTRAVENOUS | Status: DC
  Administered 2024-02-24: 5000 mg via INTRAVENOUS
  Filled 2024-02-24: qty 100

## 2024-02-24 MED ORDER — SODIUM CHLORIDE 0.9% FLUSH
10.0000 mL | INTRAVENOUS | Status: DC | PRN
  Administered 2024-02-24: 10 mL

## 2024-02-24 MED ORDER — DEXTROSE 5 % IV SOLN: INTRAVENOUS | Status: DC

## 2024-02-24 MED ORDER — PALONOSETRON HCL INJECTION 0.25 MG/5ML
0.2500 mg | Freq: Once | INTRAVENOUS | Status: AC
  Administered 2024-02-24: 0.25 mg via INTRAVENOUS
  Filled 2024-02-24: qty 5

## 2024-02-24 MED ORDER — LEUCOVORIN CALCIUM INJECTION 350 MG
400.0000 mg/m2 | Freq: Once | INTRAVENOUS | Status: AC
  Administered 2024-02-24: 756 mg via INTRAVENOUS
  Filled 2024-02-24: qty 25

## 2024-02-24 MED ORDER — DEXAMETHASONE SODIUM PHOSPHATE 10 MG/ML IJ SOLN
10.0000 mg | Freq: Once | INTRAMUSCULAR | Status: AC
  Administered 2024-02-24: 10 mg via INTRAVENOUS
  Filled 2024-02-24: qty 1

## 2024-02-24 MED ORDER — SODIUM CHLORIDE 0.9% FLUSH
10.0000 mL | Freq: Once | INTRAVENOUS | Status: AC
Start: 2024-02-24 — End: 2024-02-24
  Administered 2024-02-24: 10 mL

## 2024-02-24 NOTE — Patient Instructions (Signed)
 [facility]  Discharge Instructions: Thank you for choosing [facility] to provide your oncology and hematology care.   If you have a lab appointment with the Cancer Center, please go directly to the Cancer Center and check in at the registration area.   Wear comfortable clothing and clothing appropriate for easy access to any Portacath or PICC line.   We strive to give you quality time with your provider. You may need to reschedule your appointment if you arrive late (15 or more minutes).  Arriving late affects you and other patients whose appointments are after yours.  Also, if you miss three or more appointments without notifying the office, you may be dismissed from the clinic at the provider's discretion.      For prescription refill requests, have your pharmacy contact our office and allow 72 hours for refills to be completed.    Today you received the following chemotherapy and/or immunotherapy agents: Oxaliplatin/Leucovorin/Fluorouracil      To help prevent nausea and vomiting after your treatment, we encourage you to take your nausea medication as directed.  BELOW ARE SYMPTOMS THAT SHOULD BE REPORTED IMMEDIATELY: *FEVER GREATER THAN 100.4 F (38 C) OR HIGHER *CHILLS OR SWEATING *NAUSEA AND VOMITING THAT IS NOT CONTROLLED WITH YOUR NAUSEA MEDICATION *UNUSUAL SHORTNESS OF BREATH *UNUSUAL BRUISING OR BLEEDING *URINARY PROBLEMS (pain or burning when urinating, or frequent urination) *BOWEL PROBLEMS (unusual diarrhea, constipation, pain near the anus) TENDERNESS IN MOUTH AND THROAT WITH OR WITHOUT PRESENCE OF ULCERS (sore throat, sores in mouth, or a toothache) UNUSUAL RASH, SWELLING OR PAIN  UNUSUAL VAGINAL DISCHARGE OR ITCHING   Items with * indicate a potential emergency and should be followed up as soon as possible or go to the Emergency Department if any problems should occur.  Please show the CHEMOTHERAPY  ALERT CARD or IMMUNOTHERAPY ALERT CARD at check-in to the Emergency Department and triage nurse.  Should you have questions after your visit or need to cancel or reschedule your appointment, please contact [facility]  Dept: [phone]  and follow the prompts.  Office hours are 8:00 a.m. to 4:30 p.m. Monday - Friday. Please note that voicemails left after 4:00 p.m. may not be returned until the following business day.  We are closed weekends and major holidays. You have access to a nurse at all times for urgent questions. Please call the main number to the clinic Dept: [phone] and follow the prompts.   For any non-urgent questions, you may also contact your provider using MyChart. We now offer e-Visits for anyone 7 and older to request care online for non-urgent symptoms. For details visit [email or web address].   Also download the MyChart app! Go to the app store, search "MyChart", open the app, select [place], and log in with your MyChart username and password.

## 2024-02-24 NOTE — Progress Notes (Signed)
 Per [name] OK to proceed with tx with Plts 96 today

## 2024-02-24 NOTE — Progress Notes (Signed)
 [facility]   [phone] [phone]   Clinic Follow up Note   Patient Care Team: Irven Coe, MD as PCP - General (Family Medicine) Malachy Mood, MD as Consulting Physician (Hematology and Oncology)  Date of Service:  02/24/2024  CHIEF COMPLAINT: f/u of rectal cancer   CURRENT THERAPY:  Total neoadjuvant chemotherapy FOLFOX  Oncology History   Adenocarcinoma of rectum (HCC) -cT3bN1M0, stage IIIB, G2 -presented with rectal bleeding.  I reviewed her recent colonoscopy findings, biopsy results, and her staging CT images.  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  -pelvic MRI 10/24/2023 showed  -pt was seen by surgeon Dr. Maisie Fus and rad/onc Dr. Mitzi Hansen -she agrees with TNT, and started concurrent chemoRT on 11/03/2023 and completed on 12/17/2022 -she started chemo FOLFOX on 01/14/2024, plan for for 8 cycles    Assessment & Plan Rectal cancer Currently undergoing chemotherapy, cycle four of eight, for rectal cancer with tumor in the low rectum. She tolerates chemotherapy well, experiencing fatigue and diarrhea managed with Imodium. 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 Bleeding has ceased post-radiation therapy, indicating positive response. Chemotherapy dose reduced due to age, with potential for further reduction if necessary. Tumor status to be evaluated with CT scan post-chemotherapy. - Continue current chemotherapy regimen - Monitor neuropathy symptoms, particularly tingling and numbness - Schedule CT scan post-chemotherapy to assess tumor status - Consider digital rectal examination to evaluate tumor in the low rectum  Fatigue Experiences significant fatigue, a common chemotherapy side effect, exacerbated by age. Concerned about increased fall risk due to fatigue. - Discuss in-home care options to assist with daily activities and mitigate fall risk  Fall risk Increased  fall risk due to fatigue and age, with a fall occurring approximately one month ago. Implemented emergency communication measures using Alexa Echo devices. - Investigate insurance coverage for in-home care services - Encourage continued use of Alexa Echo devices for emergency communication  Plan -Labs reviewed, mild thrombocytopenia, adequate for treatment, will proceed cycle 4 chemotherapy today, and continue every 2 weeks for additional 4 cycles if she tolerates -Lab and follow-up in 2 weeks   SUMMARY OF ONCOLOGIC HISTORY: Oncology History  Adenocarcinoma of rectum (HCC)  09/24/2023 Imaging   CT chest abdomen and pelvis with contrast  IMPRESSION: 1. Rectal primary with nodal metastasis in the mesorectum and sigmoid mesocolon. 2. No distant metastasis. 3. Chronic left-sided hydronephrosis with overlying cortical thinning and decreased/absent left renal function. Favor chronic ureteropelvic junction obstruction, given absence of hydroureter. 4. 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 5. Incidental findings, including: Coronary artery atherosclerosis. Aortic Atherosclerosis (ICD10-I70.0). Cholelithiasis. Bilateral nephrolithiasis.   10/02/2023 Initial Diagnosis   Adenocarcinoma of rectum (HCC)   10/24/2023 Cancer Staging   Staging form: Colon and Rectum, AJCC 8th Edition - Clinical stage from 10/24/2023: Stage IIIB (cT3, cN1, cM0) - Signed by Malachy Mood, MD on 10/31/2023 Histologic grade (G): G2 Histologic grading system: 4 grade system   10/29/2023 Imaging   Pelvic MRI FINDINGS: TUMOR LOCATION  Tumor distance from Anal Verge/Skin surface: 11.1 cm.  Tumor distance to Internal Anal sphincter: 7.4 cm.  TUMOR DESCRIPTION  Circumferential extent: 9 to 6 o'clock position, 75% of the total circumference, along the superior aspect.   Tumor Size and volume: 4.6 cm craniocaudal. Up to 1.9 cm in thickness.   T - CATEGORY   Extension  through Muscularis Propria: Yes 1-50mm=T3b (approximately 1.6-1.8  mm.)   Shortest Distance of any tumor/node from Mesorectal fascia: 1.4 cm.   Extramural Vascular Invasion/Tumor Thrombus: No.   Invasion of Anterior Peritoneal Reflection: No.  Involvement of Adjacent Organs or Pelvic Sidewall: No.   Levator Ani Involvement: No.   N - CATEGORY   Mesorectal Lymph Nodes >=37mm: 1-3=N1   Extra-mesorectal Lymphadenopathy: No   Other: There are multiple sigmoid diverticula without imaging signs diverticulitis. Urinary bladder is decompressed. Surgically absent uterus. No large adnexal mass seen.   IMPRESSION: Rectal adenocarcinoma T stage: T3b   Rectal adenocarcinoma N stage: N1   Distance from tumor to the internal anal sphincter is 7.4 cm.       01/14/2024 -  Chemotherapy   Patient is on Treatment Plan : COLORECTAL FOLFOX q14d x 4 months        Discussed the use of AI scribe software for clinical note transcription with the patient, who gave verbal consent to proceed.  History of Present Illness The patient, an 83 year old female with a history of rectal cancer, presents for a follow-up visit. She is currently undergoing chemotherapy and reports tolerating the treatment well, with no nausea or vomiting. However, she experiences fatigue, which she describes as significant and expresses concern about the risk of falling due to her tiredness. She had a fall about a month ago, which has increased her fear. She also reports having diarrhea, which she manages with Imodium as needed. She has not noticed any bleeding recently, which was a symptom she had before and during the early stages of her treatment. The patient's son is present during the visit and expresses concern about her living alone. He suggests looking into in-home care, which she initially resists.     All other systems were reviewed with the patient and are negative.  MEDICAL HISTORY:  Past Medical History:  Diagnosis Date    Anxiety    Arthritis    knees ? 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   Depression    Dysthymia    FHx: cardiovascular disease    History of blood transfusion as child   History of kidney stones    Hyperlipidemia    Hypertension    LBBB (left bundle branch block) 2014   on ekg   Obesity    Pneumonia    hx of as a child   Vitamin D deficiency     SURGICAL HISTORY: Past Surgical History:  Procedure Laterality Date   ABDOMINAL HYSTERECTOMY  1986   partial   APPENDECTOMY  1986   with hysterectomy   CATARACT EXTRACTION Bilateral 2016   CYSTOSCOPY/URETEROSCOPY/HOLMIUM LASER/STENT PLACEMENT Left 02/09/2021   Procedure: LEFT URETEROSCOPY/ LASER LITHOTRIPSY  AND  STENT PLACEMENT,ENDOPYLOTOMY;  Surgeon: Crist Fat, MD;  Location: [facility];  Service: Urology;  Laterality: Left;   CYSTOSCOPY/URETEROSCOPY/HOLMIUM LASER/STENT PLACEMENT Left 03/15/2021   Procedure: CYSTOSCOPY, LEFT URETEROSCOPY, HOLMIUM LASER ENDOPYELOTOMY, URETERAL STENT PLACEMENT;  Surgeon: Crist Fat, MD;  Location: [facility];  Service: Urology;  Laterality: Left;   EYE SURGERY  2016   bil cataracts   HOLMIUM LASER APPLICATION Left 02/09/2021   Procedure: HOLMIUM LASER APPLICATION;  Surgeon: Crist Fat, MD;  Location: [facility];  Service: Urology;  Laterality: Left;   KNEE ARTHROSCOPY  1995   left   NEPHROLITHOTOMY Left 03/23/2020   Procedure: LEFT NEPHROLITHOTOMY PERCUTANEOUS WITH SURGEON ACCESS;  Surgeon: Marlou Porch,  Earle Gell, MD;  Location: [facility];  Service: Urology;  Laterality: Left;   PORTACATH PLACEMENT N/A 01/07/2024   Procedure: PORT PLACEMENT WITH ULTRASOUND GUIDANCE;  Surgeon: Romie Levee, MD;  Location: [facility];  Service: General;  Laterality: N/A;   TONSILLECTOMY  1945   adenoids removed   TOTAL HIP ARTHROPLASTY Left 06/03/2023   Procedure: TOTAL HIP ARTHROPLASTY ANTERIOR APPROACH;  Surgeon:  Durene Romans, MD;  Location: [facility];  Service: Orthopedics;  Laterality: Left;   TOTAL KNEE ARTHROPLASTY Right 06/06/2020   Procedure: TOTAL KNEE ARTHROPLASTY;  Surgeon: Durene Romans, MD;  Location: [facility];  Service: Orthopedics;  Laterality: Right;  70 mins   TOTAL KNEE ARTHROPLASTY Left 07/17/2021   Procedure: TOTAL KNEE ARTHROPLASTY;  Surgeon: Durene Romans, MD;  Location: [facility];  Service: Orthopedics;  Laterality: Left;   TUBAL LIGATION  1979    I have reviewed the social history and family history with the patient and they are unchanged from previous note.  ALLERGIES:  is allergic to statins and sulfa antibiotics.  MEDICATIONS:  Current Outpatient Medications  Medication Sig Dispense Refill   albuterol (PROVENTIL HFA;VENTOLIN HFA) 108 (90 BASE) MCG/ACT inhaler Inhale 2 puffs into the lungs every 6 (six) hours as needed for wheezing or shortness of breath. 1 Inhaler 0   bacitracin-polymyxin b (POLYSPORIN) ointment Apply 1 application topically daily as needed (wound care).     bisoprolol-hydrochlorothiazide (ZIAC) 5-6.25 MG tablet TAKE 1 TABLET BY MOUTH DAILY. (Patient taking differently: Take 1 tablet by mouth at bedtime.) 90 tablet 0   diclofenac Sodium (VOLTAREN) 1 % GEL Apply 1 Application topically 2 (two) times daily as needed (pain.).     Emollient (UDDERLY SMOOTH EX) Apply 1 Application topically as needed (dry/irritated skin).     lidocaine-prilocaine (EMLA) cream Apply to affected area once 30 g 3   pravastatin (PRAVACHOL) 40 MG tablet TAKE 1 TABLET EVERY DAY (Patient taking differently: Take 40 mg by mouth every evening.) 90 tablet 0   Propylene Glycol, PF, (SYSTANE COMPLETE PF) 0.6 % SOLN Place 1 drop into both eyes daily.     sertraline (ZOLOFT) 100 MG tablet TAKE 1 TABLET EVERY DAY (Patient taking differently: Take 150 mg by mouth at bedtime.) 90 tablet 0   traZODone (DESYREL) 50 MG tablet TAKE ONE TABLET BY MOUTH ONCE a DAY at bedtime AS NEEDED     No current  facility-administered medications for this visit.   Facility-Administered Medications Ordered in Other Visits  Medication Dose Route Frequency Provider Last Rate Last Admin   dextrose 5 % solution   Intravenous Continuous Malachy Mood, MD 10 mL/hr at 02/24/24 1053 New Bag at 02/24/24 1053   fluorouracil (ADRUCIL) 5,000 mg in sodium chloride 0.9 % 150 mL chemo infusion  2,400 mg/m2 (Treatment Plan Recorded) Intravenous 1 day or 1 dose Malachy Mood, MD       leucovorin 756 mg in dextrose 5 % 250 mL infusion  400 mg/m2 (Treatment Plan Recorded) Intravenous Once Malachy Mood, MD       oxaliplatin (ELOXATIN) 150 mg in dextrose 5 % 500 mL chemo infusion  75 mg/m2 (Treatment Plan Recorded) Intravenous Once Malachy Mood, MD       sodium chloride flush (NS) 0.9 % injection 10 mL  10 mL Intracatheter PRN Malachy Mood, MD        PHYSICAL EXAMINATION: ECOG PERFORMANCE STATUS: 1 - Symptomatic but completely ambulatory  Vitals:   02/24/24 1000  BP: 138/72  Pulse: (!) 51  Resp: 18  Temp: 97.6 F (36.4 C)  SpO2: 97%   Wt Readings from Last 3 Encounters:  02/24/24 166 lb 4.8 oz (75.4 kg)  02/10/24 167 lb 3.2 oz (75.8 kg)  01/27/24 171 lb 3.2 oz (77.7 kg)     GENERAL:alert, no distress and comfortable SKIN: skin color, texture, turgor are normal, no rashes or significant lesions EYES: normal, Conjunctiva are pink and non-injected, sclera clear NECK: supple, thyroid normal size, non-tender, without nodularity LYMPH:  no palpable lymphadenopathy in the cervical, axillary  LUNGS: clear to auscultation and percussion with normal breathing effort HEART: regular rate & rhythm and no murmurs and no lower extremity edema ABDOMEN:abdomen soft, non-tender and normal bowel sounds Musculoskeletal:no cyanosis of digits and no clubbing  NEURO: alert & oriented x 3 with fluent speech, no focal motor/sensory deficits  Physical Exam    LABORATORY DATA:  I have reviewed the data as listed    Latest Ref Rng & Units  02/24/2024    9:44 AM 02/10/2024    7:55 AM 01/27/2024   11:39 AM  CBC  WBC 4.0 - 10.5 K/uL 2.8  3.4  4.2   Hemoglobin 12.0 - 15.0 g/dL 40.9  81.1  91.4   Hematocrit 36.0 - 46.0 % 32.8  33.5  33.0   Platelets 150 - 400 K/uL 96  104  147         Latest Ref Rng & Units 02/24/2024    9:44 AM 02/10/2024    7:55 AM 01/27/2024   11:39 AM  CMP  Glucose 70 - 99 mg/dL 782  98  99   BUN 8 - 23 mg/dL 14  11  16    Creatinine 0.44 - 1.00 mg/dL 9.56  2.13  0.86   Sodium 135 - 145 mmol/L 143  142  143   Potassium 3.5 - 5.1 mmol/L 3.4  3.5  4.1   Chloride 98 - 111 mmol/L 109  105  108   CO2 22 - 32 mmol/L 29  29  28    Calcium 8.9 - 10.3 mg/dL 9.5  9.5  9.3   Total Protein 6.5 - 8.1 g/dL 6.7  6.7  6.5   Total Bilirubin 0.0 - 1.2 mg/dL 0.5  0.7  0.8   Alkaline Phos 38 - 126 U/L 95  95  107   AST 15 - 41 U/L 22  24  20    ALT 0 - 44 U/L 9  11  10        RADIOGRAPHIC STUDIES: I have personally reviewed the radiological images as listed and agreed with the findings in the report. No results found.    Orders Placed This Encounter  Procedures   CBC with Differential (Cancer Center Only)    Standing Status:   Future    Expected Date:   04/06/2024    Expiration Date:   04/06/2025   CMP (Cancer Center only)    Standing Status:   Future    Expected Date:   04/06/2024    Expiration Date:   04/06/2025   CBC with Differential (Cancer Center Only)    Standing Status:   Future    Expected Date:   04/20/2024    Expiration Date:   04/20/2025   CMP (Cancer Center only)    Standing Status:   Future    Expected Date:   04/20/2024    Expiration Date:   04/20/2025   All questions were answered. The patient knows to call the clinic with any problems, questions or concerns.  No barriers to learning was detected. The total time spent in the appointment was 25 minutes.     Malachy Mood, MD 02/24/2024

## 2024-02-25 ENCOUNTER — Other Ambulatory Visit: Payer: Self-pay

## 2024-02-25 DIAGNOSIS — C20 Malignant neoplasm of rectum: Secondary | ICD-10-CM

## 2024-02-26 ENCOUNTER — Inpatient Hospital Stay: Payer: PPO

## 2024-02-26 VITALS — BP 134/63 | HR 55 | Temp 98.4°F | Resp 18

## 2024-02-26 DIAGNOSIS — Z95828 Presence of other vascular implants and grafts: Secondary | ICD-10-CM

## 2024-02-26 DIAGNOSIS — Z5111 Encounter for antineoplastic chemotherapy: Secondary | ICD-10-CM | POA: Diagnosis not present

## 2024-02-26 MED ORDER — HEPARIN SOD (PORK) LOCK FLUSH 100 UNIT/ML IV SOLN
500.0000 [IU] | Freq: Once | INTRAVENOUS | Status: AC
  Administered 2024-02-26: 500 [IU]

## 2024-02-26 MED ORDER — SODIUM CHLORIDE 0.9% FLUSH
10.0000 mL | Freq: Once | INTRAVENOUS | Status: AC
  Administered 2024-02-26: 10 mL

## 2024-03-02 ENCOUNTER — Other Ambulatory Visit: Payer: Self-pay

## 2024-03-05 ENCOUNTER — Other Ambulatory Visit: Payer: Self-pay

## 2024-03-05 DIAGNOSIS — C20 Malignant neoplasm of rectum: Secondary | ICD-10-CM

## 2024-03-08 NOTE — Progress Notes (Unsigned)
 Patient Care Team: Benedetto Brady, MD as PCP - General (Family Medicine) Sonja [place], MD as Consulting Physician (Hematology and Oncology)  Clinic Day:  03/09/2024  Referring physician: Sonja [place], MD  ASSESSMENT & PLAN:   Assessment & Plan: Adenocarcinoma of rectum (HCC) -cT3bN1M0, stage IIIB, G2 -presented with rectal bleeding.  I reviewed her recent colonoscopy findings, biopsy results, and her staging CT images.  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  -pelvic MRI 10/24/2023 showed  -pt was seen by surgeon Dr. Andy Bannister and rad/onc Dr. Jeryl Moris -she agrees with TNT, and started concurrent chemoRT on 11/03/2023 and completed on 12/17/2022 -she started chemo FOLFOX on 01/14/2024, plan for for 8 cycles   - 03/09/2024 -patient reports for cycle 5 day 1 FOLFOX.  5-FU bolus dose removed due to mild pancytopenia.  Patient clinically doing well. -Continue with FOLFOX every 2 weeks as scheduled.   Decreased appetite due to chemotherapy Patient maintaining weight, but very tired eating bland foods along with brat diet.  Inquiring about specific foods that are okay for her to eat more foods that she should not eat.  Advised her that this is time frame she can eat what ever she can tolerate tastes good without causing her nausea, vomiting, or diarrhea.  She voiced understanding.  States she is still takes nausea medicine if needed.  Itchy scalp Patient reports dry skin/itchy scalp since starting on FOLFOX.  States she has been using Neosporin on small lesions she feels with her fingernails.  Recommend trial of hydrocortisone cream as needed and as indicated.  This may be more effective than Neosporin and relieving itchiness.  She voiced willingness to try.  Mild pancytopenia due to chemotherapy WBC 2.5 with ANC of 1.5.  Hgb 11.3 with HCT 32.3.  Improved platelets at 100.  5-FU bolus removed from FOLFOX.  Discussed with Dr. Maryalice Smaller.  Okay to proceed with treatment today if  patient's clinical status is good.  It is and we will proceed with treatment today.  Mild hypokalemia K = 3.4 today. Ensure adequate hydration. Add potassium supplement if continues or worsens.    Plan Reviewed labs.  Mild pancytopenia.  5-FU bolus already removed from FOLFOX. Patient reports feeling well overall with good clinical status.  Mild hypokalemia. Proceed with cycle 5 day 1 FOLFOX. Renew prescription for trazodone  at bedtime as needed for insomnia. Recommend topical hydrocortisone for itchy, dry scalp as needed and as indicated. Labs/flush, follow-up, and cycle 6 FOLFOX in 2 weeks as scheduled.   The patient understands the plans discussed today and is in agreement with them.  She knows to contact our office if she develops concerns prior to her next appointment.  I provided 25 minutes of face-to-face time during this encounter and > 50% was spent counseling as documented under my assessment and plan.    Sharyon Deis, NP  [facility] - A DEPT OF [facility] [address] [place] [address] Dept: [phone] Dept Fax: [phone]   No orders of the defined types were placed in this encounter.     CHIEF COMPLAINT:  CC: rectal cancer  Current Treatment:  FOLFOX every 14 day for 4 months  INTERVAL HISTORY:  Samantha Jenkins is here today for repeat clinical assessment. She presents for Cycle 5 day 1 chemotherapy FOLFOX.  Today, she reports itchy, dry scalp.  Has tried using Neosporin without relief.  Hair is thinning a little.  Would like to try other food besides bland food or brat diet.  Intermittently, has nausea.  She denies chest pain, chest pressure, or shortness of breath. She denies headaches or visual disturbances. She denies abdominal pain, nausea, vomiting, or changes in bowel or bladder habits.  She denies fevers or chills. She denies pain. Her appetite is good. Her weight has been stable.  I have  reviewed the past medical history, past surgical history, social history and family history with the patient and they are unchanged from previous note.  ALLERGIES:  is allergic to statins and sulfa antibiotics.  MEDICATIONS:  Current Outpatient Medications  Medication Sig Dispense Refill   albuterol  (PROVENTIL  HFA;VENTOLIN  HFA) 108 (90 BASE) MCG/ACT inhaler Inhale 2 puffs into the lungs every 6 (six) hours as needed for wheezing or shortness of breath. 1 Inhaler 0   bacitracin-polymyxin b (POLYSPORIN) ointment Apply 1 application topically daily as needed (wound care).     bisoprolol -hydrochlorothiazide  (ZIAC ) 5-6.25 MG tablet TAKE 1 TABLET BY MOUTH DAILY. (Patient taking differently: Take 1 tablet by mouth at bedtime.) 90 tablet 0   diclofenac Sodium (VOLTAREN) 1 % GEL Apply 1 Application topically 2 (two) times daily as needed (pain.).     Emollient (UDDERLY SMOOTH EX) Apply 1 Application topically as needed (dry/irritated skin).     lidocaine -prilocaine  (EMLA ) cream Apply to affected area once 30 g 3   pravastatin  (PRAVACHOL ) 40 MG tablet TAKE 1 TABLET EVERY DAY (Patient taking differently: Take 40 mg by mouth every evening.) 90 tablet 0   Propylene Glycol, PF, (SYSTANE COMPLETE PF) 0.6 % SOLN Place 1 drop into both eyes daily.     sertraline  (ZOLOFT ) 100 MG tablet TAKE 1 TABLET EVERY DAY (Patient taking differently: Take 150 mg by mouth at bedtime.) 90 tablet 0   traZODone  (DESYREL ) 50 MG tablet Take 1 tablet (50 mg total) by mouth at bedtime as needed for sleep. 90 tablet 1   No current facility-administered medications for this visit.   Facility-Administered Medications Ordered in Other Visits  Medication Dose Route Frequency Provider Last Rate Last Admin   dextrose  5 % solution   Intravenous Continuous Sonja [place], MD   Stopped at 03/09/24 1440   fluorouracil  (ADRUCIL ) 5,000 mg in sodium chloride  0.9 % 150 mL chemo infusion  2,400 mg/m2 (Treatment Plan Recorded) Intravenous 1 day or 1  dose Sonja [place], MD   Infusion Verify at 03/09/24 1517    HISTORY OF PRESENT ILLNESS:   Oncology History  Adenocarcinoma of rectum (HCC)  09/24/2023 Imaging   CT chest abdomen and pelvis with contrast  IMPRESSION: 1. Rectal primary with nodal metastasis in the mesorectum and sigmoid mesocolon. 2. No distant metastasis. 3. Chronic left-sided hydronephrosis with overlying cortical thinning and decreased/absent left renal function. Favor chronic ureteropelvic junction obstruction, given absence of hydroureter. 4. 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 5. Incidental findings, including: Coronary artery atherosclerosis. Aortic Atherosclerosis (ICD10-I70.0). Cholelithiasis. Bilateral nephrolithiasis.   10/02/2023 Initial Diagnosis   Adenocarcinoma of rectum (HCC)   10/24/2023 Cancer Staging   Staging form: Colon and Rectum, AJCC 8th Edition - Clinical stage from 10/24/2023: Stage IIIB (cT3, cN1, cM0) - Signed by Sonja [place], MD on 10/31/2023 Histologic grade (G): G2 Histologic grading system: 4 grade system   10/29/2023 Imaging   Pelvic MRI FINDINGS: TUMOR LOCATION  Tumor distance from Anal Verge/Skin surface: 11.1 cm.  Tumor distance to Internal Anal sphincter: 7.4 cm.  TUMOR DESCRIPTION  Circumferential extent:  9 to 6 o'clock position, 75% of the total circumference, along the superior aspect.   Tumor Size and volume: 4.6 cm craniocaudal. Up to 1.9 cm in thickness.   T - CATEGORY   Extension through Muscularis Propria: Yes 1-27mm=T3b (approximately 1.6-1.8 mm.)   Shortest Distance of any tumor/node from Mesorectal fascia: 1.4 cm.   Extramural Vascular Invasion/Tumor Thrombus: No.   Invasion of Anterior Peritoneal Reflection: No.  Involvement of Adjacent Organs or Pelvic Sidewall: No.   Levator Ani Involvement: No.   N - CATEGORY   Mesorectal Lymph Nodes >=26mm: 1-3=N1   Extra-mesorectal Lymphadenopathy: No   Other: There are  multiple sigmoid diverticula without imaging signs diverticulitis. Urinary bladder is decompressed. Surgically absent uterus. No large adnexal mass seen.   IMPRESSION: Rectal adenocarcinoma T stage: T3b   Rectal adenocarcinoma N stage: N1   Distance from tumor to the internal anal sphincter is 7.4 cm.       01/14/2024 -  Chemotherapy   Patient is on Treatment Plan : COLORECTAL FOLFOX q14d x 4 months         REVIEW OF SYSTEMS:   Constitutional: Denies fevers, chills or abnormal weight loss. fatigue Eyes: Denies blurriness of vision Ears, nose, mouth, throat, and face: Denies mucositis or sore throat Respiratory: Denies cough, dyspnea or wheezes Cardiovascular: Denies palpitation, chest discomfort or lower extremity swelling Gastrointestinal:  Denies nausea, heartburn or change in bowel habits Skin: Denies abnormal skin rashes. Itchy and dry scalp  Lymphatics: Denies new lymphadenopathy or easy bruising Neurological:Denies numbness, tingling or new weaknesses Behavioral/Psych: Mood is stable, no new changes  All other systems were reviewed with the patient and are negative.   VITALS:   Today's Vitals   03/09/24 1027  BP: (!) 124/52  Pulse: (!) 52  Resp: 17  Temp: 97.8 F (36.6 C)  SpO2: 97%  Weight: 165 lb 9.6 oz (75.1 kg)  PainSc: 0-No pain   Body mass index is 27.56 kg/m.   Wt Readings from Last 3 Encounters:  03/09/24 165 lb 9.6 oz (75.1 kg)  02/24/24 166 lb 4.8 oz (75.4 kg)  02/10/24 167 lb 3.2 oz (75.8 kg)    Body mass index is 27.56 kg/m.  Performance status (ECOG): 1 - Symptomatic but completely ambulatory  PHYSICAL EXAM:   GENERAL:alert, no distress and comfortable. SKIN: skin color, texture, turgor are normal, no rashes or significant lesions. Itchy and dry scalp.  EYES: normal, Conjunctiva are pink and non-injected, sclera clear OROPHARYNX:no exudate, no erythema and lips, buccal mucosa, and tongue normal  NECK: supple, thyroid normal size,  non-tender, without nodularity LYMPH:  no palpable lymphadenopathy in the cervical, axillary or inguinal LUNGS: clear to auscultation and percussion with normal breathing effort HEART: regular rate & rhythm and no murmurs and no lower extremity edema ABDOMEN:abdomen soft, non-tender and normal bowel sounds Musculoskeletal:no cyanosis of digits and no clubbing  NEURO: alert & oriented x 3 with fluent speech, no focal motor/sensory deficits  LABORATORY DATA:  I have reviewed the data as listed    Component Value Date/Time   NA 144 03/09/2024 0948   K 3.4 (L) 03/09/2024 0948   CL 107 03/09/2024 0948   CO2 32 03/09/2024 0948   GLUCOSE 103 (H) 03/09/2024 0948   BUN 13 03/09/2024 0948   CREATININE 0.92 03/09/2024 0948   CREATININE 0.71 03/30/2015 0951   CALCIUM  9.5 03/09/2024 0948   PROT 6.6 03/09/2024 0948   ALBUMIN 3.8 03/09/2024 0948   AST 22 03/09/2024 0948  ALT 8 03/09/2024 0948   ALKPHOS 85 03/09/2024 0948   BILITOT 0.7 03/09/2024 0948   GFRNONAA >60 03/09/2024 0948   GFRAA 46 (L) 06/07/2020 0304   Lab Results  Component Value Date   WBC 2.5 (L) 03/09/2024   NEUTROABS 1.5 (L) 03/09/2024   HGB 11.3 (L) 03/09/2024   HCT 32.5 (L) 03/09/2024   MCV 96.7 03/09/2024   PLT 100 (L) 03/09/2024

## 2024-03-08 NOTE — Assessment & Plan Note (Signed)
-  [id number], stage IIIB, G2 -presented with rectal bleeding.  I reviewed her recent colonoscopy findings, biopsy results, and her staging CT images.  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  -pelvic MRI 10/24/2023 showed  -pt was seen by surgeon Dr. Andy Bannister and rad/onc Dr. Jeryl Moris -she agrees with TNT, and started concurrent chemoRT on 11/03/2023 and completed on 12/17/2022 -she started chemo FOLFOX on 01/14/2024, plan for for 8 cycles   - 03/09/2024 -patient reports for cycle 5 day 1 FOLFOX.  5-FU bolus dose removed due to mild pancytopenia.  Patient clinically doing well. -Continue with FOLFOX every 2 weeks as scheduled.

## 2024-03-09 ENCOUNTER — Inpatient Hospital Stay: Payer: PPO

## 2024-03-09 ENCOUNTER — Encounter: Payer: Self-pay | Admitting: Nurse Practitioner

## 2024-03-09 ENCOUNTER — Inpatient Hospital Stay: Payer: PPO | Admitting: Nurse Practitioner

## 2024-03-09 VITALS — BP 124/52 | HR 52 | Temp 97.8°F | Resp 17 | Wt 165.6 lb

## 2024-03-09 DIAGNOSIS — Z5111 Encounter for antineoplastic chemotherapy: Secondary | ICD-10-CM | POA: Diagnosis not present

## 2024-03-09 DIAGNOSIS — Z95828 Presence of other vascular implants and grafts: Secondary | ICD-10-CM

## 2024-03-09 DIAGNOSIS — C20 Malignant neoplasm of rectum: Secondary | ICD-10-CM

## 2024-03-09 LAB — CBC WITH DIFFERENTIAL (CANCER CENTER ONLY)
Abs Immature Granulocytes: 0.01 10*3/uL (ref 0.00–0.07)
Basophils Absolute: 0 10*3/uL (ref 0.0–0.1)
Basophils Relative: 0 %
Eosinophils Absolute: 0.1 10*3/uL (ref 0.0–0.5)
Eosinophils Relative: 4 %
HCT: 32.5 % — ABNORMAL LOW (ref 36.0–46.0)
Hemoglobin: 11.3 g/dL — ABNORMAL LOW (ref 12.0–15.0)
Immature Granulocytes: 0 %
Lymphocytes Relative: 20 %
Lymphs Abs: 0.5 10*3/uL — ABNORMAL LOW (ref 0.7–4.0)
MCH: 33.6 pg (ref 26.0–34.0)
MCHC: 34.8 g/dL (ref 30.0–36.0)
MCV: 96.7 fL (ref 80.0–100.0)
Monocytes Absolute: 0.4 10*3/uL (ref 0.1–1.0)
Monocytes Relative: 15 %
Neutro Abs: 1.5 10*3/uL — ABNORMAL LOW (ref 1.7–7.7)
Neutrophils Relative %: 61 %
Platelet Count: 100 10*3/uL — ABNORMAL LOW (ref 150–400)
RBC: 3.36 MIL/uL — ABNORMAL LOW (ref 3.87–5.11)
RDW: 14.6 % (ref 11.5–15.5)
WBC Count: 2.5 10*3/uL — ABNORMAL LOW (ref 4.0–10.5)
nRBC: 0 % (ref 0.0–0.2)

## 2024-03-09 LAB — CMP (CANCER CENTER ONLY)
ALT: 8 U/L (ref 0–44)
AST: 22 U/L (ref 15–41)
Albumin: 3.8 g/dL (ref 3.5–5.0)
Alkaline Phosphatase: 85 U/L (ref 38–126)
Anion gap: 5 (ref 5–15)
BUN: 13 mg/dL (ref 8–23)
CO2: 32 mmol/L (ref 22–32)
Calcium: 9.5 mg/dL (ref 8.9–10.3)
Chloride: 107 mmol/L (ref 98–111)
Creatinine: 0.92 mg/dL (ref 0.44–1.00)
GFR, Estimated: >60 mL/min (ref >60)
Glucose, Bld: 103 mg/dL — ABNORMAL HIGH (ref 70–99)
Potassium: 3.4 mmol/L — ABNORMAL LOW (ref 3.5–5.1)
Sodium: 144 mmol/L (ref 135–145)
Total Bilirubin: 0.7 mg/dL (ref 0.0–1.2)
Total Protein: 6.6 g/dL (ref 6.5–8.1)

## 2024-03-09 MED ORDER — SODIUM CHLORIDE 0.9 % IV SOLN
2400.0000 mg/m2 | INTRAVENOUS | Status: DC
  Administered 2024-03-09: 5000 mg via INTRAVENOUS
  Filled 2024-03-09: qty 100

## 2024-03-09 MED ORDER — DEXAMETHASONE SODIUM PHOSPHATE 10 MG/ML IJ SOLN
10.0000 mg | Freq: Once | INTRAMUSCULAR | Status: AC
  Administered 2024-03-09: 10 mg via INTRAVENOUS
  Filled 2024-03-09: qty 1

## 2024-03-09 MED ORDER — TRAZODONE HCL 50 MG PO TABS
50.0000 mg | ORAL_TABLET | Freq: Every evening | ORAL | 1 refills | Status: AC | PRN
Start: 2024-03-09 — End: ?

## 2024-03-09 MED ORDER — PALONOSETRON HCL INJECTION 0.25 MG/5ML
0.2500 mg | Freq: Once | INTRAVENOUS | Status: AC
  Administered 2024-03-09: 0.25 mg via INTRAVENOUS
  Filled 2024-03-09: qty 5

## 2024-03-09 MED ORDER — LEUCOVORIN CALCIUM INJECTION 350 MG
400.0000 mg/m2 | Freq: Once | INTRAVENOUS | Status: AC
  Administered 2024-03-09: 756 mg via INTRAVENOUS
  Filled 2024-03-09: qty 25

## 2024-03-09 MED ORDER — DEXTROSE 5 % IV SOLN
INTRAVENOUS | Status: DC
Start: 2024-03-09 — End: 2024-03-09

## 2024-03-09 MED ORDER — SODIUM CHLORIDE 0.9% FLUSH
10.0000 mL | Freq: Once | INTRAVENOUS | Status: AC
  Administered 2024-03-09: 10 mL

## 2024-03-09 MED ORDER — OXALIPLATIN CHEMO INJECTION 100 MG/20ML
75.0000 mg/m2 | Freq: Once | INTRAVENOUS | Status: AC
  Administered 2024-03-09: 150 mg via INTRAVENOUS
  Filled 2024-03-09: qty 10

## 2024-03-09 NOTE — Patient Instructions (Signed)
 [facility] - A DEPT OF [facility]  Discharge Instructions: Thank you for choosing [facility] to provide your oncology and hematology care.   If you have a lab appointment with the Cancer Center, please go directly to the Cancer Center and check in at the registration area.   Wear comfortable clothing and clothing appropriate for easy access to any Portacath or PICC line.   We strive to give you quality time with your provider. You may need to reschedule your appointment if you arrive late (15 or more minutes).  Arriving late affects you and other patients whose appointments are after yours.  Also, if you miss three or more appointments without notifying the office, you may be dismissed from the clinic at the provider's discretion.      For prescription refill requests, have your pharmacy contact our office and allow 72 hours for refills to be completed.    Today you received the following chemotherapy and/or immunotherapy agents oxaliplatin, leucovorin, fluorourcil      To help prevent nausea and vomiting after your treatment, we encourage you to take your nausea medication as directed.  BELOW ARE SYMPTOMS THAT SHOULD BE REPORTED IMMEDIATELY: *FEVER GREATER THAN 100.4 F (38 C) OR HIGHER *CHILLS OR SWEATING *NAUSEA AND VOMITING THAT IS NOT CONTROLLED WITH YOUR NAUSEA MEDICATION *UNUSUAL SHORTNESS OF BREATH *UNUSUAL BRUISING OR BLEEDING *URINARY PROBLEMS (pain or burning when urinating, or frequent urination) *BOWEL PROBLEMS (unusual diarrhea, constipation, pain near the anus) TENDERNESS IN MOUTH AND THROAT WITH OR WITHOUT PRESENCE OF ULCERS (sore throat, sores in mouth, or a toothache) UNUSUAL RASH, SWELLING OR PAIN  UNUSUAL VAGINAL DISCHARGE OR ITCHING   Items with * indicate a potential emergency and should be followed up as soon as possible or go to the Emergency Department if any problems should occur.  Please show the CHEMOTHERAPY  ALERT CARD or IMMUNOTHERAPY ALERT CARD at check-in to the Emergency Department and triage nurse.  Should you have questions after your visit or need to cancel or reschedule your appointment, please contact [facility] - A DEPT OF [facility]  Dept: [phone]  and follow the prompts.  Office hours are 8:00 a.m. to 4:30 p.m. Monday - Friday. Please note that voicemails left after 4:00 p.m. may not be returned until the following business day.  We are closed weekends and major holidays. You have access to a nurse at all times for urgent questions. Please call the main number to the clinic Dept: [phone] and follow the prompts.   For any non-urgent questions, you may also contact your provider using MyChart. We now offer e-Visits for anyone 19 and older to request care online for non-urgent symptoms. For details visit [email or web address].   Also download the MyChart app! Go to the app store, search "MyChart", open the app, select , and log in with your MyChart username and password.

## 2024-03-11 ENCOUNTER — Inpatient Hospital Stay: Payer: PPO

## 2024-03-11 VITALS — BP 124/67 | HR 60 | Temp 98.1°F | Resp 18

## 2024-03-11 DIAGNOSIS — Z95828 Presence of other vascular implants and grafts: Secondary | ICD-10-CM

## 2024-03-11 DIAGNOSIS — Z5111 Encounter for antineoplastic chemotherapy: Secondary | ICD-10-CM | POA: Diagnosis not present

## 2024-03-11 MED ORDER — HEPARIN SOD (PORK) LOCK FLUSH 100 UNIT/ML IV SOLN
500.0000 [IU] | Freq: Once | INTRAVENOUS | Status: AC
  Administered 2024-03-11: 500 [IU]

## 2024-03-11 MED ORDER — SODIUM CHLORIDE 0.9% FLUSH
10.0000 mL | Freq: Once | INTRAVENOUS | Status: AC
  Administered 2024-03-11: 10 mL

## 2024-03-12 ENCOUNTER — Other Ambulatory Visit: Payer: Self-pay

## 2024-03-12 DIAGNOSIS — C20 Malignant neoplasm of rectum: Secondary | ICD-10-CM

## 2024-03-13 DIAGNOSIS — C20 Malignant neoplasm of rectum: Secondary | ICD-10-CM | POA: Diagnosis not present

## 2024-03-18 ENCOUNTER — Telehealth: Payer: Self-pay

## 2024-03-18 NOTE — Telephone Encounter (Signed)
 Spoke with pt via telephone regarding Home Health w/[facility].  Pt stated that she does not need a PCA nor Home Health despite what her son says.  Pt stated that her son does not control her decisions.  Pt stated that her son requested Home Health the last time she was in the office to see Dr. Maryalice Smaller but she does not feel she needs it and is currently REFUSING home health.  Pt stated she will request home health when she feels she's ready for some help within her home.  Pt also stated she does not want her medical information discussed with her son.  Stated this nurse will notified [facility] and Dr. Maryalice Smaller and her Team.

## 2024-03-22 NOTE — Assessment & Plan Note (Signed)
-  [id number], stage IIIB, G2 -presented with rectal bleeding.  I reviewed her recent colonoscopy findings, biopsy results, and her staging CT images.  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  -pelvic MRI 10/24/2023 showed  -pt was seen by surgeon Dr. Maisie Fus and rad/onc Dr. Mitzi Hansen -she agrees with TNT, and started concurrent chemoRT on 11/03/2023 and completed on 12/17/2022 -she started chemo FOLFOX on 01/14/2024, plan for for 8 cycles

## 2024-03-23 ENCOUNTER — Encounter: Payer: Self-pay | Admitting: Hematology

## 2024-03-23 ENCOUNTER — Inpatient Hospital Stay (HOSPITAL_BASED_OUTPATIENT_CLINIC_OR_DEPARTMENT_OTHER): Payer: PPO | Admitting: Hematology

## 2024-03-23 ENCOUNTER — Inpatient Hospital Stay: Payer: PPO

## 2024-03-23 ENCOUNTER — Inpatient Hospital Stay: Payer: PPO | Attending: Radiation Oncology

## 2024-03-23 VITALS — BP 138/72 | HR 65 | Temp 97.4°F | Resp 21 | Ht 65.0 in | Wt 167.7 lb

## 2024-03-23 DIAGNOSIS — C20 Malignant neoplasm of rectum: Secondary | ICD-10-CM

## 2024-03-23 DIAGNOSIS — Z5111 Encounter for antineoplastic chemotherapy: Secondary | ICD-10-CM | POA: Insufficient documentation

## 2024-03-23 DIAGNOSIS — D701 Agranulocytosis secondary to cancer chemotherapy: Secondary | ICD-10-CM | POA: Diagnosis not present

## 2024-03-23 DIAGNOSIS — E876 Hypokalemia: Secondary | ICD-10-CM | POA: Diagnosis not present

## 2024-03-23 LAB — CMP (CANCER CENTER ONLY)
ALT: 11 U/L (ref 0–44)
AST: 27 U/L (ref 15–41)
Albumin: 3.7 g/dL (ref 3.5–5.0)
Alkaline Phosphatase: 93 U/L (ref 38–126)
Anion gap: 6 (ref 5–15)
BUN: 8 mg/dL (ref 8–23)
CO2: 28 mmol/L (ref 22–32)
Calcium: 9.2 mg/dL (ref 8.9–10.3)
Chloride: 110 mmol/L (ref 98–111)
Creatinine: 0.87 mg/dL (ref 0.44–1.00)
GFR, Estimated: >60 mL/min (ref >60)
Glucose, Bld: 105 mg/dL — ABNORMAL HIGH (ref 70–99)
Potassium: 3.3 mmol/L — ABNORMAL LOW (ref 3.5–5.1)
Sodium: 144 mmol/L (ref 135–145)
Total Bilirubin: 0.5 mg/dL (ref 0.0–1.2)
Total Protein: 6.4 g/dL — ABNORMAL LOW (ref 6.5–8.1)

## 2024-03-23 LAB — CBC WITH DIFFERENTIAL (CANCER CENTER ONLY)
Abs Immature Granulocytes: 0.01 10*3/uL (ref 0.00–0.07)
Basophils Absolute: 0 10*3/uL (ref 0.0–0.1)
Basophils Relative: 0 %
Eosinophils Absolute: 0.1 10*3/uL (ref 0.0–0.5)
Eosinophils Relative: 4 %
HCT: 31.4 % — ABNORMAL LOW (ref 36.0–46.0)
Hemoglobin: 10.8 g/dL — ABNORMAL LOW (ref 12.0–15.0)
Immature Granulocytes: 0 %
Lymphocytes Relative: 21 %
Lymphs Abs: 0.5 10*3/uL — ABNORMAL LOW (ref 0.7–4.0)
MCH: 33.2 pg (ref 26.0–34.0)
MCHC: 34.4 g/dL (ref 30.0–36.0)
MCV: 96.6 fL (ref 80.0–100.0)
Monocytes Absolute: 0.4 10*3/uL (ref 0.1–1.0)
Monocytes Relative: 16 %
Neutro Abs: 1.4 10*3/uL — ABNORMAL LOW (ref 1.7–7.7)
Neutrophils Relative %: 59 %
Platelet Count: 82 10*3/uL — ABNORMAL LOW (ref 150–400)
RBC: 3.25 MIL/uL — ABNORMAL LOW (ref 3.87–5.11)
RDW: 15.8 % — ABNORMAL HIGH (ref 11.5–15.5)
WBC Count: 2.4 10*3/uL — ABNORMAL LOW (ref 4.0–10.5)
nRBC: 0 % (ref 0.0–0.2)

## 2024-03-23 MED ORDER — SODIUM CHLORIDE 0.9 % IV SOLN
2000.0000 mg/m2 | INTRAVENOUS | Status: DC
  Administered 2024-03-23: 3500 mg via INTRAVENOUS
  Filled 2024-03-23: qty 70

## 2024-03-23 MED ORDER — DEXTROSE 5 % IV SOLN: INTRAVENOUS | Status: DC

## 2024-03-23 MED ORDER — LEUCOVORIN CALCIUM INJECTION 350 MG
400.0000 mg/m2 | Freq: Once | INTRAVENOUS | Status: AC
  Administered 2024-03-23: 756 mg via INTRAVENOUS
  Filled 2024-03-23: qty 37.8

## 2024-03-23 MED ORDER — PALONOSETRON HCL INJECTION 0.25 MG/5ML
0.2500 mg | Freq: Once | INTRAVENOUS | Status: AC
  Administered 2024-03-23: 0.25 mg via INTRAVENOUS

## 2024-03-23 MED ORDER — OXALIPLATIN CHEMO INJECTION 100 MG/20ML
75.0000 mg/m2 | Freq: Once | INTRAVENOUS | Status: AC
  Administered 2024-03-23: 150 mg via INTRAVENOUS
  Filled 2024-03-23: qty 10

## 2024-03-23 MED ORDER — DEXAMETHASONE SODIUM PHOSPHATE 10 MG/ML IJ SOLN
10.0000 mg | Freq: Once | INTRAMUSCULAR | Status: AC
  Administered 2024-03-23: 10 mg via INTRAVENOUS

## 2024-03-23 NOTE — Progress Notes (Signed)
 [facility]   [phone]) [phone] [phone]) [phone]   Clinic Follow up Note   Patient Care Team: Benedetto Brady, MD as PCP - General (Family Medicine) Sonja Santa Claus, MD as Consulting Physician (Hematology and Oncology)  Date of Service:  03/23/2024  CHIEF COMPLAINT: f/u of rectal cancer  CURRENT THERAPY:  Total neoadjuvant chemotherapy FOLFOX  Oncology History   Adenocarcinoma of rectum (HCC) -cT3bN1M0, stage IIIB, G2 -presented with rectal bleeding.  I reviewed her recent colonoscopy findings, biopsy results, and her staging CT images.  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  -pelvic MRI 10/24/2023 showed  -pt was seen by surgeon Dr. Andy Bannister and rad/onc Dr. Jeryl Moris -she agrees with TNT, and started concurrent chemoRT on 11/03/2023 and completed on 12/17/2022 -she started chemo FOLFOX on 01/14/2024, plan for for 8 cycles    Assessment & Plan Rectal cancer Rectal cancer under chemotherapy treatment, which she is tolerating well despite age-related concerns. No bleeding episodes in over two months. A positive attitude aids in coping with treatment. A post-chemotherapy scan is planned to assess response. If cancer persists, surgery will be considered. Discussed potential need for temporary ostomy, with permanent ostomy not being an option. - Continue chemotherapy for two more cycles - Plan post-chemotherapy scan to assess cancer status - Discuss surgical options if cancer persists post-chemotherapy  Thrombocytopenia Thrombocytopenia with low platelet count. Plan to reduce chemotherapy dose to mitigate fatigue and manage thrombocytopenia. Advised to monitor for signs of bleeding such as epistaxis, gum bleeding, hematuria, or melena. - Reduce chemotherapy dose - Monitor for signs of bleeding - Educate on signs of bleeding to watch for  Hypokalemia Hypokalemia with low potassium levels. She is unable to swallow large potassium pills. Plan to  cut current potassium pills in half to facilitate swallowing. Encouraged to consume potassium-rich foods such as oranges and nuts, but she dislikes bananas. - Cut potassium pills in half for easier swallowing - Encourage consumption of potassium-rich foods like oranges and nuts  Plan - Lab reviewed, adequate for treatment, will proceed cycle 6 FOLFOX today, with dose reduction on 5-FU due to thrombocytopenia - Lab and follow-up in 2 weeks before cycle 7 chemotherapy   SUMMARY OF ONCOLOGIC HISTORY: Oncology History  Adenocarcinoma of rectum (HCC)  09/24/2023 Imaging   CT chest abdomen and pelvis with contrast  IMPRESSION: 1. Rectal primary with nodal metastasis in the mesorectum and sigmoid mesocolon. 2. No distant metastasis. 3. Chronic left-sided hydronephrosis with overlying cortical thinning and decreased/absent left renal function. Favor chronic ureteropelvic junction obstruction, given absence of hydroureter. 4. 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 5. Incidental findings, including: Coronary artery atherosclerosis. Aortic Atherosclerosis (ICD10-I70.0). Cholelithiasis. Bilateral nephrolithiasis.   10/02/2023 Initial Diagnosis   Adenocarcinoma of rectum (HCC)   10/24/2023 Cancer Staging   Staging form: Colon and Rectum, AJCC 8th Edition - Clinical stage from 10/24/2023: Stage IIIB (cT3, cN1, cM0) - Signed by Sonja Boulder, MD on 10/31/2023 Histologic grade (G): G2 Histologic grading system: 4 grade system   10/29/2023 Imaging   Pelvic MRI FINDINGS: TUMOR LOCATION  Tumor distance from Anal Verge/Skin surface: 11.1 cm.  Tumor distance to Internal Anal sphincter: 7.4 cm.  TUMOR DESCRIPTION  Circumferential extent: 9 to 6 o'clock position, 75% of the total circumference, along the superior aspect.   Tumor Size and volume: 4.6 cm craniocaudal. Up to 1.9 cm in thickness.   T - CATEGORY   Extension through Muscularis Propria:  Yes 1-30mm=T3b (approximately 1.6-1.8 mm.)   Shortest Distance of any tumor/node from Mesorectal fascia: 1.4 cm.   Extramural Vascular Invasion/Tumor Thrombus: No.   Invasion of Anterior Peritoneal Reflection: No.  Involvement of Adjacent Organs or Pelvic Sidewall: No.   Levator Ani Involvement: No.   N - CATEGORY   Mesorectal Lymph Nodes >=30mm: 1-3=N1   Extra-mesorectal Lymphadenopathy: No   Other: There are multiple sigmoid diverticula without imaging signs diverticulitis. Urinary bladder is decompressed. Surgically absent uterus. No large adnexal mass seen.   IMPRESSION: Rectal adenocarcinoma T stage: T3b   Rectal adenocarcinoma N stage: N1   Distance from tumor to the internal anal sphincter is 7.4 cm.       01/14/2024 -  Chemotherapy   Patient is on Treatment Plan : COLORECTAL FOLFOX q14d x 4 months        Discussed the use of AI scribe software for clinical note transcription with the patient, who gave verbal consent to proceed.  History of Present Illness Samantha Jenkins is an 83 year old female with rectal cancer who presents for follow-up.  She is undergoing chemotherapy with a regimen of two days of treatment followed by ten days off. She has tolerated the treatment well, with no bleeding for over two months and only one episode of vomiting after eating too quickly.  Her thrombocytopenia has required a reduction in chemotherapy dosage. Her platelet count remains low, and she is vigilant for signs of bleeding.  She has hypokalemia and struggles with swallowing large potassium pills, though she is open to trying smaller or split pills. She dislikes bananas as a dietary source of potassium.     All other systems were reviewed with the patient and are negative.  MEDICAL HISTORY:  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   Depression     Dysthymia    FHx: cardiovascular disease    History of blood transfusion as child   History of kidney stones    Hyperlipidemia    Hypertension    LBBB (left bundle branch block) 2014   on ekg   Obesity    Pneumonia    hx of as a child   Vitamin D  deficiency     SURGICAL HISTORY: Past Surgical History:  Procedure Laterality Date   ABDOMINAL HYSTERECTOMY  1986   partial   APPENDECTOMY  1986   with hysterectomy   CATARACT EXTRACTION Bilateral 2016   CYSTOSCOPY/URETEROSCOPY/HOLMIUM LASER/STENT PLACEMENT Left 02/09/2021   Procedure: LEFT URETEROSCOPY/ LASER LITHOTRIPSY  AND  STENT PLACEMENT,ENDOPYLOTOMY;  Surgeon: Andrez Banker, MD;  Location: [facility];  Service: Urology;  Laterality: Left;   CYSTOSCOPY/URETEROSCOPY/HOLMIUM LASER/STENT PLACEMENT Left 03/15/2021   Procedure: CYSTOSCOPY, LEFT URETEROSCOPY, HOLMIUM LASER ENDOPYELOTOMY, URETERAL STENT PLACEMENT;  Surgeon: Andrez Banker, MD;  Location: [facility];  Service: Urology;  Laterality: Left;   EYE SURGERY  2016   bil cataracts   HOLMIUM LASER APPLICATION Left 02/09/2021   Procedure: HOLMIUM LASER APPLICATION;  Surgeon: Andrez Banker, MD;  Location[facility];  Service: Urology;  Laterality: Left;   KNEE ARTHROSCOPY  1995   left   NEPHROLITHOTOMY Left 03/23/2020   Procedure: LEFT NEPHROLITHOTOMY PERCUTANEOUS WITH SURGEON ACCESS;  Surgeon: Andrez Banker, MD;  Location: [facility];  Service: Urology;  Laterality: Left;   PORTACATH PLACEMENT N/A 01/07/2024   Procedure: PORT PLACEMENT WITH ULTRASOUND GUIDANCE;  Surgeon: Joyce Nixon, MD;  Location: [facility];  Service: General;  Laterality: N/A;   TONSILLECTOMY  1945   adenoids removed   TOTAL HIP ARTHROPLASTY Left 06/03/2023   Procedure: TOTAL HIP ARTHROPLASTY ANTERIOR APPROACH;  Surgeon: Claiborne Crew, MD;  Location: [facility];  Service: Orthopedics;  Laterality: Left;   TOTAL KNEE ARTHROPLASTY Right 06/06/2020   Procedure: TOTAL KNEE  ARTHROPLASTY;  Surgeon: Claiborne Crew, MD;  Location: [facility];  Service: Orthopedics;  Laterality: Right;  70 mins   TOTAL KNEE ARTHROPLASTY Left 07/17/2021   Procedure: TOTAL KNEE ARTHROPLASTY;  Surgeon: Claiborne Crew, MD;  Location: [facility];  Service: Orthopedics;  Laterality: Left;   TUBAL LIGATION  1979    I have reviewed the social history and family history with the patient and they are unchanged from previous note.  ALLERGIES:  is allergic to statins and sulfa antibiotics.  MEDICATIONS:  Current Outpatient Medications  Medication Sig Dispense Refill   albuterol  (PROVENTIL  HFA;VENTOLIN  HFA) 108 (90 BASE) MCG/ACT inhaler Inhale 2 puffs into the lungs every 6 (six) hours as needed for wheezing or shortness of breath. 1 Inhaler 0   bacitracin-polymyxin b (POLYSPORIN) ointment Apply 1 application topically daily as needed (wound care).     bisoprolol -hydrochlorothiazide  (ZIAC ) 5-6.25 MG tablet TAKE 1 TABLET BY MOUTH DAILY. (Patient taking differently: Take 1 tablet by mouth at bedtime.) 90 tablet 0   diclofenac Sodium (VOLTAREN) 1 % GEL Apply 1 Application topically 2 (two) times daily as needed (pain.).     Emollient (UDDERLY SMOOTH EX) Apply 1 Application topically as needed (dry/irritated skin).     lidocaine -prilocaine  (EMLA ) cream Apply to affected area once 30 g 3   pravastatin  (PRAVACHOL ) 40 MG tablet TAKE 1 TABLET EVERY DAY (Patient taking differently: Take 40 mg by mouth every evening.) 90 tablet 0   Propylene Glycol, PF, (SYSTANE COMPLETE PF) 0.6 % SOLN Place 1 drop into both eyes daily.     sertraline  (ZOLOFT ) 100 MG tablet TAKE 1 TABLET EVERY DAY (Patient taking differently: Take 150 mg by mouth at bedtime.) 90 tablet 0   traZODone  (DESYREL ) 50 MG tablet Take 1 tablet (50 mg total) by mouth at bedtime as needed for sleep. 90 tablet 1   No current facility-administered medications for this visit.   Facility-Administered Medications Ordered in Other Visits  Medication Dose Route  Frequency Provider Last Rate Last Admin   dextrose  5 % solution   Intravenous Continuous Sonja [place], MD   Stopped at 03/23/24 1346   fluorouracil  (ADRUCIL ) 3,500 mg in sodium chloride  0.9 % 80 mL chemo infusion  2,000 mg/m2 (Treatment Plan Recorded) Intravenous 1 day or 1 dose Sonja Grover, MD   Infusion Verify at 03/23/24 1357    PHYSICAL EXAMINATION: ECOG PERFORMANCE STATUS: 1 - Symptomatic but completely ambulatory  Vitals:   03/23/24 1009  BP: 138/72  Pulse: 65  Resp: (!) 21  Temp: (!) 97.4 F (36.3 C)  SpO2: 97%   Wt Readings from Last 3 Encounters:  03/23/24 167 lb 11.2 oz (76.1 kg)  03/09/24 165 lb 9.6 oz (75.1 kg)  02/24/24 166 lb 4.8 oz (75.4 kg)     GENERAL:alert, no distress and comfortable SKIN: skin color, texture, turgor are normal, no rashes or significant lesions EYES: normal, Conjunctiva are pink and non-injected, sclera clear NECK: supple, thyroid normal size, non-tender, without nodularity LYMPH:  no palpable lymphadenopathy in the cervical,  axillary  LUNGS: clear to auscultation and percussion with normal breathing effort HEART: regular rate & rhythm and no murmurs and no lower extremity edema ABDOMEN:abdomen soft, non-tender and normal bowel sounds Musculoskeletal:no cyanosis of digits and no clubbing  NEURO: alert & oriented x 3 with fluent speech, no focal motor/sensory deficits  Physical Exam    LABORATORY DATA:  I have reviewed the data as listed    Latest Ref Rng & Units 03/23/2024    9:36 AM 03/09/2024    9:48 AM 02/24/2024    9:44 AM  CBC  WBC 4.0 - 10.5 K/uL 2.4  2.5  2.8   Hemoglobin 12.0 - 15.0 g/dL 78.2  95.6  21.3   Hematocrit 36.0 - 46.0 % 31.4  32.5  32.8   Platelets 150 - 400 K/uL 82  100  96         Latest Ref Rng & Units 03/23/2024    9:36 AM 03/09/2024    9:48 AM 02/24/2024    9:44 AM  CMP  Glucose 70 - 99 mg/dL 086  578  469   BUN 8 - 23 mg/dL 8  13  14    Creatinine 0.44 - 1.00 mg/dL 6.29  5.28  4.13   Sodium 135 - 145 mmol/L 144   144  143   Potassium 3.5 - 5.1 mmol/L 3.3  3.4  3.4   Chloride 98 - 111 mmol/L 110  107  109   CO2 22 - 32 mmol/L 28  32  29   Calcium  8.9 - 10.3 mg/dL 9.2  9.5  9.5   Total Protein 6.5 - 8.1 g/dL 6.4  6.6  6.7   Total Bilirubin 0.0 - 1.2 mg/dL 0.5  0.7  0.5   Alkaline Phos 38 - 126 U/L 93  85  95   AST 15 - 41 U/L 27  22  22    ALT 0 - 44 U/L 11  8  9        RADIOGRAPHIC STUDIES: I have personally reviewed the radiological images as listed and agreed with the findings in the report. No results found.    No orders of the defined types were placed in this encounter.  All questions were answered. The patient knows to call the clinic with any problems, questions or concerns. No barriers to learning was detected. The total time spent in the appointment was 40 minutes.     Sonja Garden, MD 03/23/2024

## 2024-03-25 ENCOUNTER — Inpatient Hospital Stay: Payer: PPO

## 2024-03-25 VITALS — BP 148/59 | HR 56 | Temp 97.8°F | Resp 14

## 2024-03-25 DIAGNOSIS — C20 Malignant neoplasm of rectum: Secondary | ICD-10-CM

## 2024-03-25 DIAGNOSIS — Z5111 Encounter for antineoplastic chemotherapy: Secondary | ICD-10-CM | POA: Diagnosis not present

## 2024-03-25 MED ORDER — HEPARIN SOD (PORK) LOCK FLUSH 100 UNIT/ML IV SOLN
500.0000 [IU] | Freq: Once | INTRAVENOUS | Status: AC | PRN
  Administered 2024-03-25: 500 [IU]

## 2024-03-25 MED ORDER — SODIUM CHLORIDE 0.9% FLUSH
10.0000 mL | INTRAVENOUS | Status: DC | PRN
  Administered 2024-03-25: 10 mL

## 2024-04-06 ENCOUNTER — Encounter: Payer: Self-pay | Admitting: Hematology

## 2024-04-06 ENCOUNTER — Inpatient Hospital Stay

## 2024-04-06 ENCOUNTER — Inpatient Hospital Stay: Admitting: Hematology

## 2024-04-06 ENCOUNTER — Encounter (HOSPITAL_COMMUNITY)

## 2024-04-06 VITALS — BP 154/68 | HR 51 | Temp 98.1°F | Resp 16 | Wt 167.5 lb

## 2024-04-06 DIAGNOSIS — Z5111 Encounter for antineoplastic chemotherapy: Secondary | ICD-10-CM | POA: Diagnosis not present

## 2024-04-06 DIAGNOSIS — C20 Malignant neoplasm of rectum: Secondary | ICD-10-CM

## 2024-04-06 DIAGNOSIS — R600 Localized edema: Secondary | ICD-10-CM | POA: Diagnosis not present

## 2024-04-06 DIAGNOSIS — Z95828 Presence of other vascular implants and grafts: Secondary | ICD-10-CM

## 2024-04-06 LAB — CBC WITH DIFFERENTIAL (CANCER CENTER ONLY)
Abs Immature Granulocytes: 0.02 10*3/uL (ref 0.00–0.07)
Basophils Absolute: 0 10*3/uL (ref 0.0–0.1)
Basophils Relative: 0 %
Eosinophils Absolute: 0.1 10*3/uL (ref 0.0–0.5)
Eosinophils Relative: 4 %
HCT: 31.5 % — ABNORMAL LOW (ref 36.0–46.0)
Hemoglobin: 10.7 g/dL — ABNORMAL LOW (ref 12.0–15.0)
Immature Granulocytes: 1 %
Lymphocytes Relative: 17 %
Lymphs Abs: 0.4 10*3/uL — ABNORMAL LOW (ref 0.7–4.0)
MCH: 33.2 pg (ref 26.0–34.0)
MCHC: 34 g/dL (ref 30.0–36.0)
MCV: 97.8 fL (ref 80.0–100.0)
Monocytes Absolute: 0.5 10*3/uL (ref 0.1–1.0)
Monocytes Relative: 19 %
Neutro Abs: 1.5 10*3/uL — ABNORMAL LOW (ref 1.7–7.7)
Neutrophils Relative %: 59 %
Platelet Count: 76 10*3/uL — ABNORMAL LOW (ref 150–400)
RBC: 3.22 MIL/uL — ABNORMAL LOW (ref 3.87–5.11)
RDW: 16.5 % — ABNORMAL HIGH (ref 11.5–15.5)
WBC Count: 2.5 10*3/uL — ABNORMAL LOW (ref 4.0–10.5)
nRBC: 0 % (ref 0.0–0.2)

## 2024-04-06 LAB — CMP (CANCER CENTER ONLY)
ALT: 9 U/L (ref 0–44)
AST: 23 U/L (ref 15–41)
Albumin: 3.6 g/dL (ref 3.5–5.0)
Alkaline Phosphatase: 104 U/L (ref 38–126)
Anion gap: 5 (ref 5–15)
BUN: 12 mg/dL (ref 8–23)
CO2: 28 mmol/L (ref 22–32)
Calcium: 9.2 mg/dL (ref 8.9–10.3)
Chloride: 108 mmol/L (ref 98–111)
Creatinine: 0.91 mg/dL (ref 0.44–1.00)
GFR, Estimated: >60 mL/min
Glucose, Bld: 104 mg/dL — ABNORMAL HIGH (ref 70–99)
Potassium: 3.6 mmol/L (ref 3.5–5.1)
Sodium: 141 mmol/L (ref 135–145)
Total Bilirubin: 0.6 mg/dL (ref 0.0–1.2)
Total Protein: 6.4 g/dL — ABNORMAL LOW (ref 6.5–8.1)

## 2024-04-06 MED ORDER — SODIUM CHLORIDE 0.9 % IV SOLN
2000.0000 mg/m2 | INTRAVENOUS | Status: DC
  Administered 2024-04-06: 3500 mg via INTRAVENOUS
  Filled 2024-04-06: qty 70

## 2024-04-06 MED ORDER — LEUCOVORIN CALCIUM INJECTION 350 MG
400.0000 mg/m2 | Freq: Once | INTRAVENOUS | Status: AC
  Administered 2024-04-06: 756 mg via INTRAVENOUS
  Filled 2024-04-06: qty 25

## 2024-04-06 MED ORDER — DEXTROSE 5 % IV SOLN: INTRAVENOUS | Status: DC

## 2024-04-06 MED ORDER — SODIUM CHLORIDE 0.9% FLUSH
10.0000 mL | Freq: Once | INTRAVENOUS | Status: AC
  Administered 2024-04-06: 10 mL

## 2024-04-06 MED ORDER — OXALIPLATIN CHEMO INJECTION 100 MG/20ML
75.0000 mg/m2 | Freq: Once | INTRAVENOUS | Status: AC
  Administered 2024-04-06: 150 mg via INTRAVENOUS
  Filled 2024-04-06: qty 10

## 2024-04-06 MED ORDER — DEXAMETHASONE SODIUM PHOSPHATE 10 MG/ML IJ SOLN
10.0000 mg | Freq: Once | INTRAMUSCULAR | Status: AC
  Administered 2024-04-06: 10 mg via INTRAVENOUS
  Filled 2024-04-06: qty 1

## 2024-04-06 MED ORDER — PALONOSETRON HCL INJECTION 0.25 MG/5ML
0.2500 mg | Freq: Once | INTRAVENOUS | Status: AC
  Administered 2024-04-06: 0.25 mg via INTRAVENOUS
  Filled 2024-04-06: qty 5

## 2024-04-06 NOTE — Patient Instructions (Signed)
 [facility] - A DEPT OF [facility]  Discharge Instructions: Thank you for choosing [facility] to provide your oncology and hematology care.   If you have a lab appointment with the Cancer Center, please go directly to the Cancer Center and check in at the registration area.   Wear comfortable clothing and clothing appropriate for easy access to any Portacath or PICC line.   We strive to give you quality time with your provider. You may need to reschedule your appointment if you arrive late (15 or more minutes).  Arriving late affects you and other patients whose appointments are after yours.  Also, if you miss three or more appointments without notifying the office, you may be dismissed from the clinic at the provider's discretion.      For prescription refill requests, have your pharmacy contact our office and allow 72 hours for refills to be completed.    Today you received the following chemotherapy and/or immunotherapy agents oxaliplatin, leucovorin, fluorourcil      To help prevent nausea and vomiting after your treatment, we encourage you to take your nausea medication as directed.  BELOW ARE SYMPTOMS THAT SHOULD BE REPORTED IMMEDIATELY: *FEVER GREATER THAN 100.4 F (38 C) OR HIGHER *CHILLS OR SWEATING *NAUSEA AND VOMITING THAT IS NOT CONTROLLED WITH YOUR NAUSEA MEDICATION *UNUSUAL SHORTNESS OF BREATH *UNUSUAL BRUISING OR BLEEDING *URINARY PROBLEMS (pain or burning when urinating, or frequent urination) *BOWEL PROBLEMS (unusual diarrhea, constipation, pain near the anus) TENDERNESS IN MOUTH AND THROAT WITH OR WITHOUT PRESENCE OF ULCERS (sore throat, sores in mouth, or a toothache) UNUSUAL RASH, SWELLING OR PAIN  UNUSUAL VAGINAL DISCHARGE OR ITCHING   Items with * indicate a potential emergency and should be followed up as soon as possible or go to the Emergency Department if any problems should occur.  Please show the CHEMOTHERAPY  ALERT CARD or IMMUNOTHERAPY ALERT CARD at check-in to the Emergency Department and triage nurse.  Should you have questions after your visit or need to cancel or reschedule your appointment, please contact [facility] - A DEPT OF [facility]  Dept: [phone]  and follow the prompts.  Office hours are 8:00 a.m. to 4:30 p.m. Monday - Friday. Please note that voicemails left after 4:00 p.m. may not be returned until the following business day.  We are closed weekends and major holidays. You have access to a nurse at all times for urgent questions. Please call the main number to the clinic Dept: [phone] and follow the prompts.   For any non-urgent questions, you may also contact your provider using MyChart. We now offer e-Visits for anyone 19 and older to request care online for non-urgent symptoms. For details visit [email or web address].   Also download the MyChart app! Go to the app store, search "MyChart", open the app, select , and log in with your MyChart username and password.

## 2024-04-06 NOTE — Assessment & Plan Note (Signed)
-  [id number], stage IIIB, G2 -presented with rectal bleeding.  I reviewed her recent colonoscopy findings, biopsy results, and her staging CT images.  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  -pelvic MRI 10/24/2023 showed  -pt was seen by surgeon Dr. Maisie Fus and rad/onc Dr. Mitzi Hansen -she agrees with TNT, and started concurrent chemoRT on 11/03/2023 and completed on 12/17/2022 -she started chemo FOLFOX on 01/14/2024, plan for for 8 cycles

## 2024-04-06 NOTE — Progress Notes (Signed)
 [facility]   [phone] Fax:([phone]   Clinic Follow up Note   Patient Care Team: Benedetto Brady, MD as PCP - General (Family Medicine) Sonja Halsey, MD as Consulting Physician (Hematology and Oncology)  Date of Service:  04/06/2024  CHIEF COMPLAINT: f/u of rectal cancer   CURRENT THERAPY:  Chemo FOLFOX   Oncology History   Adenocarcinoma of rectum (HCC) -cT3bN1M0, stage IIIB, G2 -presented with rectal bleeding.  I reviewed her recent colonoscopy findings, biopsy results, and her staging CT images.  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  -pelvic MRI 10/24/2023 showed  -pt was seen by surgeon Dr. Andy Bannister and rad/onc Dr. Jeryl Moris -she agrees with TNT, and started concurrent chemoRT on 11/03/2023 and completed on 12/17/2022 -she started chemo FOLFOX on 01/14/2024, plan for for 8 cycles    Assessment & Plan Rectal cancer Rectal cancer under treatment with adjuvant chemotherapy. She experiences fatigue post-treatment but no significant side effects such as nausea or neuropathy. Blood counts are slightly low but allow continuation of chemotherapy. One more cycle of chemotherapy is planned. A CT scan and pelvic MRI will be scheduled post-chemotherapy to assess treatment response. - Proceed with current chemotherapy regimen - Order CT scan and pelvic MRI post-chemotherapy  Peripheral edema New onset left lower extremity edema with swelling for two days. No associated pain or dyspnea.  - Order Doppler ultrasound of the left lower extremity to rule out DVT  Plan - Lab reviewed, adequate for treatment, will proceed with cycle 7 chemotherapy FOLFOX - Doppler of left lower extremity to rule out DVT - Follow-up in 2 weeks before last cycle chemotherapy, will order restaging scans on next visit.   SUMMARY OF ONCOLOGIC HISTORY: Oncology History  Adenocarcinoma of rectum (HCC)  09/24/2023 Imaging   CT chest abdomen and pelvis with  contrast  IMPRESSION: 1. Rectal primary with nodal metastasis in the mesorectum and sigmoid mesocolon. 2. No distant metastasis. 3. Chronic left-sided hydronephrosis with overlying cortical thinning and decreased/absent left renal function. Favor chronic ureteropelvic junction obstruction, given absence of hydroureter. 4. 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 5. Incidental findings, including: Coronary artery atherosclerosis. Aortic Atherosclerosis (ICD10-I70.0). Cholelithiasis. Bilateral nephrolithiasis.   10/02/2023 Initial Diagnosis   Adenocarcinoma of rectum (HCC)   10/24/2023 Cancer Staging   Staging form: Colon and Rectum, AJCC 8th Edition - Clinical stage from 10/24/2023: Stage IIIB (cT3, cN1, cM0) - Signed by Sonja [place], MD on 10/31/2023 Histologic grade (G): G2 Histologic grading system: 4 grade system   10/29/2023 Imaging   Pelvic MRI FINDINGS: TUMOR LOCATION  Tumor distance from Anal Verge/Skin surface: 11.1 cm.  Tumor distance to Internal Anal sphincter: 7.4 cm.  TUMOR DESCRIPTION  Circumferential extent: 9 to 6 o'clock position, 75% of the total circumference, along the superior aspect.   Tumor Size and volume: 4.6 cm craniocaudal. Up to 1.9 cm in thickness.   T - CATEGORY   Extension through Muscularis Propria: Yes 1-81mm=T3b (approximately 1.6-1.8 mm.)   Shortest Distance of any tumor/node from Mesorectal fascia: 1.4 cm.   Extramural Vascular Invasion/Tumor Thrombus: No.   Invasion of Anterior Peritoneal Reflection: No.  Involvement of Adjacent Organs or Pelvic Sidewall: No.   Levator Ani Involvement: No.   N - CATEGORY   Mesorectal Lymph Nodes >=63mm: 1-3=N1   Extra-mesorectal Lymphadenopathy: No   Other: There are multiple sigmoid diverticula without imaging signs diverticulitis. Urinary bladder is decompressed. Surgically absent uterus. No  large adnexal mass seen.   IMPRESSION: Rectal adenocarcinoma T  stage: T3b   Rectal adenocarcinoma N stage: N1   Distance from tumor to the internal anal sphincter is 7.4 cm.       01/14/2024 -  Chemotherapy   Patient is on Treatment Plan : COLORECTAL FOLFOX q14d x 4 months        Discussed the use of AI scribe software for clinical note transcription with the patient, who gave verbal consent to proceed.  History of Present Illness Samantha Jenkins is an 83 year old female with rectal cancer who presents for follow-up of her treatment.  She is undergoing neoadjuvant chemotherapy for rectal cancer and experiences fatigue, particularly in the afternoons and the day following treatment. There are no significant side effects such as nausea or neuropathy. She has no fever or chills.  She has swelling in her left foot, which began two days ago. The swelling is absent in the morning but develops as the day progresses. There is no associated pain or shortness of breath.  Her appetite remains good, and she has resumed cooking. She is currently taking pravastatin , managed by her primary care physician.     All other systems were reviewed with the patient and are negative.  MEDICAL HISTORY:  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   Depression    Dysthymia    FHx: cardiovascular disease    History of blood transfusion as child   History of kidney stones    Hyperlipidemia    Hypertension    LBBB (left bundle branch block) 2014   on ekg   Obesity    Pneumonia    hx of as a child   Vitamin D  deficiency     SURGICAL HISTORY: Past Surgical History:  Procedure Laterality Date   ABDOMINAL HYSTERECTOMY  1986   partial   APPENDECTOMY  1986   with hysterectomy   CATARACT EXTRACTION Bilateral 2016   CYSTOSCOPY/URETEROSCOPY/HOLMIUM LASER/STENT PLACEMENT Left 02/09/2021   Procedure: LEFT URETEROSCOPY/ LASER LITHOTRIPSY   AND  STENT PLACEMENT,ENDOPYLOTOMY;  Surgeon: Andrez Banker, MD;  Location: [facility];  Service: Urology;  Laterality: Left;   CYSTOSCOPY/URETEROSCOPY/HOLMIUM LASER/STENT PLACEMENT Left 03/15/2021   Procedure: CYSTOSCOPY, LEFT URETEROSCOPY, HOLMIUM LASER ENDOPYELOTOMY, URETERAL STENT PLACEMENT;  Surgeon: Andrez Banker, MD;  Location: [facility];  Service: Urology;  Laterality: Left;   EYE SURGERY  2016   bil cataracts   HOLMIUM LASER APPLICATION Left 02/09/2021   Procedure: HOLMIUM LASER APPLICATION;  Surgeon: Andrez Banker, MD;  Location: [facility];  Service: Urology;  Laterality: Left;   KNEE ARTHROSCOPY  1995   left   NEPHROLITHOTOMY Left 03/23/2020   Procedure: LEFT NEPHROLITHOTOMY PERCUTANEOUS WITH SURGEON ACCESS;  Surgeon: Andrez Banker, MD;  Location: [facility];  Service: Urology;  Laterality: Left;   PORTACATH PLACEMENT N/A 01/07/2024   Procedure: PORT PLACEMENT WITH ULTRASOUND GUIDANCE;  Surgeon: Joyce Nixon, MD;  Location: [facility];  Service: General;  Laterality: N/A;   TONSILLECTOMY  1945   adenoids removed   TOTAL HIP ARTHROPLASTY Left 06/03/2023   Procedure: TOTAL HIP ARTHROPLASTY ANTERIOR APPROACH;  Surgeon: Claiborne Crew, MD;  Location: [facility];  Service: Orthopedics;  Laterality: Left;   TOTAL KNEE ARTHROPLASTY Right 06/06/2020   Procedure:  TOTAL KNEE ARTHROPLASTY;  Surgeon: Claiborne Crew, MD;  Location: [facility];  Service: Orthopedics;  Laterality: Right;  70 mins   TOTAL KNEE ARTHROPLASTY Left 07/17/2021   Procedure: TOTAL KNEE ARTHROPLASTY;  Surgeon: Claiborne Crew, MD;  Location: [facility];  Service: Orthopedics;  Laterality: Left;   TUBAL LIGATION  1979    I have reviewed the social history and family history with the patient and they are unchanged from previous note.  ALLERGIES:  is allergic to statins and sulfa antibiotics.  MEDICATIONS:  Current Outpatient Medications  Medication Sig Dispense Refill   albuterol  (PROVENTIL   HFA;VENTOLIN  HFA) 108 (90 BASE) MCG/ACT inhaler Inhale 2 puffs into the lungs every 6 (six) hours as needed for wheezing or shortness of breath. 1 Inhaler 0   bacitracin-polymyxin b (POLYSPORIN) ointment Apply 1 application topically daily as needed (wound care).     bisoprolol -hydrochlorothiazide  (ZIAC ) 5-6.25 MG tablet TAKE 1 TABLET BY MOUTH DAILY. (Patient taking differently: Take 1 tablet by mouth at bedtime.) 90 tablet 0   diclofenac Sodium (VOLTAREN) 1 % GEL Apply 1 Application topically 2 (two) times daily as needed (pain.).     Emollient (UDDERLY SMOOTH EX) Apply 1 Application topically as needed (dry/irritated skin).     lidocaine -prilocaine  (EMLA ) cream Apply to affected area once 30 g 3   pravastatin  (PRAVACHOL ) 40 MG tablet TAKE 1 TABLET EVERY DAY (Patient taking differently: Take 40 mg by mouth every evening.) 90 tablet 0   Propylene Glycol, PF, (SYSTANE COMPLETE PF) 0.6 % SOLN Place 1 drop into both eyes daily.     sertraline  (ZOLOFT ) 100 MG tablet TAKE 1 TABLET EVERY DAY (Patient taking differently: Take 150 mg by mouth at bedtime.) 90 tablet 0   traZODone  (DESYREL ) 50 MG tablet Take 1 tablet (50 mg total) by mouth at bedtime as needed for sleep. 90 tablet 1   No current facility-administered medications for this visit.   Facility-Administered Medications Ordered in Other Visits  Medication Dose Route Frequency Provider Last Rate Last Admin   dextrose  5 % solution   Intravenous Continuous Sonja [place], MD   Stopped at 04/06/24 1413   fluorouracil  (ADRUCIL ) 3,500 mg in sodium chloride  0.9 % 80 mL chemo infusion  2,000 mg/m2 (Treatment Plan Recorded) Intravenous 1 day or 1 dose Sonja Muscoy, MD   Infusion Verify at 04/06/24 1425    PHYSICAL EXAMINATION: ECOG PERFORMANCE STATUS: 2 - Symptomatic, <50% confined to bed  There were no vitals filed for this visit. Wt Readings from Last 3 Encounters:  04/06/24 167 lb 8 oz (76 kg)  03/23/24 167 lb 11.2 oz (76.1 kg)  03/09/24 165 lb 9.6 oz  (75.1 kg)     GENERAL:alert, no distress and comfortable SKIN: skin color, texture, turgor are normal, no rashes or significant lesions EYES: normal, Conjunctiva are pink and non-injected, sclera clear NECK: supple, thyroid normal size, non-tender, without nodularity LYMPH:  no palpable lymphadenopathy in the cervical, axillary  LUNGS: clear to auscultation and percussion with normal breathing effort HEART: regular rate & rhythm and no murmurs  ABDOMEN:abdomen soft, non-tender and normal bowel sounds Musculoskeletal:no cyanosis of digits and no clubbing  NEURO: alert & oriented x 3 with fluent speech, no focal motor/sensory deficits EXTREMITIES: Left lower extremity edema present Physical Exam   LABORATORY DATA:  I have reviewed the data as listed    Latest Ref Rng & Units 04/06/2024   10:16 AM 03/23/2024    9:36 AM 03/09/2024    9:48 AM  CBC  WBC  4.0 - 10.5 K/uL 2.5  2.4  2.5   Hemoglobin 12.0 - 15.0 g/dL 65.7  84.6  96.2   Hematocrit 36.0 - 46.0 % 31.5  31.4  32.5   Platelets 150 - 400 K/uL 76  82  100         Latest Ref Rng & Units 04/06/2024   10:16 AM 03/23/2024    9:36 AM 03/09/2024    9:48 AM  CMP  Glucose 70 - 99 mg/dL 952  841  324   BUN 8 - 23 mg/dL 12  8  13    Creatinine 0.44 - 1.00 mg/dL 4.01  0.27  2.53   Sodium 135 - 145 mmol/L 141  144  144   Potassium 3.5 - 5.1 mmol/L 3.6  3.3  3.4   Chloride 98 - 111 mmol/L 108  110  107   CO2 22 - 32 mmol/L 28  28  32   Calcium  8.9 - 10.3 mg/dL 9.2  9.2  9.5   Total Protein 6.5 - 8.1 g/dL 6.4  6.4  6.6   Total Bilirubin 0.0 - 1.2 mg/dL 0.6  0.5  0.7   Alkaline Phos 38 - 126 U/L 104  93  85   AST 15 - 41 U/L 23  27  22    ALT 0 - 44 U/L 9  11  8        RADIOGRAPHIC STUDIES: I have personally reviewed the radiological images as listed and agreed with the findings in the report. No results found.    No orders of the defined types were placed in this encounter.  All questions were answered. The patient knows to call  the clinic with any problems, questions or concerns. No barriers to learning was detected. The total time spent in the appointment was 25 minutes, including review of chart and various tests results, discussions about plan of care and coordination of care plan     Sonja Mason, MD 04/06/2024

## 2024-04-07 ENCOUNTER — Ambulatory Visit (HOSPITAL_COMMUNITY)
Admission: RE | Admit: 2024-04-07 | Discharge: 2024-04-07 | Disposition: A | Discharge status: Home / Self Care | Source: Ambulatory Visit | Attending: Vascular Surgery | Admitting: Vascular Surgery

## 2024-04-07 DIAGNOSIS — C20 Malignant neoplasm of rectum: Secondary | ICD-10-CM | POA: Diagnosis not present

## 2024-04-07 DIAGNOSIS — R600 Localized edema: Secondary | ICD-10-CM | POA: Diagnosis not present

## 2024-04-08 ENCOUNTER — Inpatient Hospital Stay

## 2024-04-08 VITALS — BP 141/76 | HR 55 | Temp 98.7°F | Resp 18

## 2024-04-08 DIAGNOSIS — Z5111 Encounter for antineoplastic chemotherapy: Secondary | ICD-10-CM | POA: Diagnosis not present

## 2024-04-08 DIAGNOSIS — C20 Malignant neoplasm of rectum: Secondary | ICD-10-CM

## 2024-04-08 MED ORDER — SODIUM CHLORIDE 0.9% FLUSH
10.0000 mL | INTRAVENOUS | Status: DC | PRN
  Administered 2024-04-08: 10 mL

## 2024-04-08 MED ORDER — HEPARIN SOD (PORK) LOCK FLUSH 100 UNIT/ML IV SOLN
500.0000 [IU] | Freq: Once | INTRAVENOUS | Status: AC | PRN
Start: 2024-04-08 — End: 2024-04-08
  Administered 2024-04-08: 500 [IU]

## 2024-04-12 DIAGNOSIS — C20 Malignant neoplasm of rectum: Secondary | ICD-10-CM | POA: Diagnosis not present

## 2024-04-19 NOTE — Assessment & Plan Note (Addendum)
-  [id number], stage IIIB, G2 -presented with rectal bleeding.  I reviewed her recent colonoscopy findings, biopsy results, and her staging CT images.  She has low rectal adenocarcinoma, CT is suspicious for nodal metastasis but no distant mets   -pelvic MRI 10/24/2023 showed T3N1 disease -pt was seen by surgeon Dr. Andy Bannister and rad/onc Dr. Jeryl Moris -she agrees with TNT, and started concurrent chemoRT on 11/03/2023 and completed on 12/17/2022 -she started chemo FOLFOX on 01/14/2024, plan for for 8 cycles , she will complete on 04/20/2024

## 2024-04-20 ENCOUNTER — Inpatient Hospital Stay: Attending: Radiation Oncology

## 2024-04-20 ENCOUNTER — Inpatient Hospital Stay: Admitting: Hematology

## 2024-04-20 ENCOUNTER — Inpatient Hospital Stay

## 2024-04-20 ENCOUNTER — Encounter: Payer: Self-pay | Admitting: Hematology

## 2024-04-20 VITALS — BP 142/78 | HR 64 | Temp 97.2°F | Resp 17 | Ht 65.0 in | Wt 167.2 lb

## 2024-04-20 DIAGNOSIS — D6481 Anemia due to antineoplastic chemotherapy: Secondary | ICD-10-CM | POA: Insufficient documentation

## 2024-04-20 DIAGNOSIS — C20 Malignant neoplasm of rectum: Secondary | ICD-10-CM

## 2024-04-20 DIAGNOSIS — T451X5A Adverse effect of antineoplastic and immunosuppressive drugs, initial encounter: Secondary | ICD-10-CM | POA: Insufficient documentation

## 2024-04-20 DIAGNOSIS — Z5111 Encounter for antineoplastic chemotherapy: Secondary | ICD-10-CM | POA: Diagnosis not present

## 2024-04-20 DIAGNOSIS — Z95828 Presence of other vascular implants and grafts: Secondary | ICD-10-CM

## 2024-04-20 LAB — CBC WITH DIFFERENTIAL (CANCER CENTER ONLY)
Abs Immature Granulocytes: 0.01 10*3/uL (ref 0.00–0.07)
Basophils Absolute: 0 10*3/uL (ref 0.0–0.1)
Basophils Relative: 1 %
Eosinophils Absolute: 0.1 10*3/uL (ref 0.0–0.5)
Eosinophils Relative: 4 %
HCT: 31.4 % — ABNORMAL LOW (ref 36.0–46.0)
Hemoglobin: 10.7 g/dL — ABNORMAL LOW (ref 12.0–15.0)
Immature Granulocytes: 0 %
Lymphocytes Relative: 19 %
Lymphs Abs: 0.5 10*3/uL — ABNORMAL LOW (ref 0.7–4.0)
MCH: 33.8 pg (ref 26.0–34.0)
MCHC: 34.1 g/dL (ref 30.0–36.0)
MCV: 99.1 fL (ref 80.0–100.0)
Monocytes Absolute: 0.5 10*3/uL (ref 0.1–1.0)
Monocytes Relative: 20 %
Neutro Abs: 1.4 10*3/uL — ABNORMAL LOW (ref 1.7–7.7)
Neutrophils Relative %: 56 %
Platelet Count: 81 10*3/uL — ABNORMAL LOW (ref 150–400)
RBC: 3.17 MIL/uL — ABNORMAL LOW (ref 3.87–5.11)
RDW: 16.6 % — ABNORMAL HIGH (ref 11.5–15.5)
WBC Count: 2.6 10*3/uL — ABNORMAL LOW (ref 4.0–10.5)
nRBC: 0 % (ref 0.0–0.2)

## 2024-04-20 LAB — CMP (CANCER CENTER ONLY)
ALT: 9 U/L (ref 0–44)
AST: 24 U/L (ref 15–41)
Albumin: 3.6 g/dL (ref 3.5–5.0)
Alkaline Phosphatase: 98 U/L (ref 38–126)
Anion gap: 6 (ref 5–15)
BUN: 8 mg/dL (ref 8–23)
CO2: 28 mmol/L (ref 22–32)
Calcium: 9.2 mg/dL (ref 8.9–10.3)
Chloride: 109 mmol/L (ref 98–111)
Creatinine: 0.85 mg/dL (ref 0.44–1.00)
GFR, Estimated: >60 mL/min (ref >60)
Glucose, Bld: 102 mg/dL — ABNORMAL HIGH (ref 70–99)
Potassium: 3.9 mmol/L (ref 3.5–5.1)
Sodium: 143 mmol/L (ref 135–145)
Total Bilirubin: 0.7 mg/dL (ref 0.0–1.2)
Total Protein: 6.6 g/dL (ref 6.5–8.1)

## 2024-04-20 MED ORDER — SODIUM CHLORIDE 0.9% FLUSH
10.0000 mL | Freq: Once | INTRAVENOUS | Status: AC
  Administered 2024-04-20: 10 mL

## 2024-04-20 MED ORDER — DEXAMETHASONE SODIUM PHOSPHATE 10 MG/ML IJ SOLN
10.0000 mg | Freq: Once | INTRAMUSCULAR | Status: AC
  Administered 2024-04-20: 10 mg via INTRAVENOUS
  Filled 2024-04-20: qty 1

## 2024-04-20 MED ORDER — LEUCOVORIN CALCIUM INJECTION 350 MG
400.0000 mg/m2 | Freq: Once | INTRAVENOUS | Status: AC
  Administered 2024-04-20: 756 mg via INTRAVENOUS
  Filled 2024-04-20: qty 17.5

## 2024-04-20 MED ORDER — SODIUM CHLORIDE 0.9 % IV SOLN
2000.0000 mg/m2 | INTRAVENOUS | Status: DC
  Administered 2024-04-20: 3500 mg via INTRAVENOUS
  Filled 2024-04-20: qty 70

## 2024-04-20 MED ORDER — SODIUM CHLORIDE 0.9% FLUSH
10.0000 mL | INTRAVENOUS | Status: DC | PRN
  Administered 2024-04-20: 10 mL

## 2024-04-20 MED ORDER — OXALIPLATIN CHEMO INJECTION 100 MG/20ML
75.0000 mg/m2 | Freq: Once | INTRAVENOUS | Status: AC
  Administered 2024-04-20: 150 mg via INTRAVENOUS
  Filled 2024-04-20: qty 23.12

## 2024-04-20 MED ORDER — PALONOSETRON HCL INJECTION 0.25 MG/5ML
0.2500 mg | Freq: Once | INTRAVENOUS | Status: AC
  Administered 2024-04-20: 0.25 mg via INTRAVENOUS
  Filled 2024-04-20: qty 5

## 2024-04-20 MED ORDER — DEXTROSE 5 % IV SOLN: INTRAVENOUS | Status: DC

## 2024-04-20 NOTE — Patient Instructions (Addendum)
 [facility] - A DEPT OF [facility]  Discharge Instructions: Thank you for choosing [facility] to provide your oncology and hematology care.   If you have a lab appointment with the Cancer Center, please go directly to the Cancer Center and check in at the registration area.   Wear comfortable clothing and clothing appropriate for easy access to any Portacath or PICC line.   We strive to give you quality time with your provider. You may need to reschedule your appointment if you arrive late (15 or more minutes).  Arriving late affects you and other patients whose appointments are after yours.  Also, if you miss three or more appointments without notifying the office, you may be dismissed from the clinic at the provider's discretion.      For prescription refill requests, have your pharmacy contact our office and allow 72 hours for refills to be completed.    Today you received the following chemotherapy and/or immunotherapy agents: Oxaliplatin /Leucovorin /Fluorouracil       To help prevent nausea and vomiting after your treatment, we encourage you to take your nausea medication as directed.  BELOW ARE SYMPTOMS THAT SHOULD BE REPORTED IMMEDIATELY: *FEVER GREATER THAN 100.4 F (38 C) OR HIGHER *CHILLS OR SWEATING *NAUSEA AND VOMITING THAT IS NOT CONTROLLED WITH YOUR NAUSEA MEDICATION *UNUSUAL SHORTNESS OF BREATH *UNUSUAL BRUISING OR BLEEDING *URINARY PROBLEMS (pain or burning when urinating, or frequent urination) *BOWEL PROBLEMS (unusual diarrhea, constipation, pain near the anus) TENDERNESS IN MOUTH AND THROAT WITH OR WITHOUT PRESENCE OF ULCERS (sore throat, sores in mouth, or a toothache) UNUSUAL RASH, SWELLING OR PAIN  UNUSUAL VAGINAL DISCHARGE OR ITCHING   Items with * indicate a potential emergency and should be followed up as soon as possible or go to the Emergency Department if any problems should occur.  Please show the CHEMOTHERAPY  ALERT CARD or IMMUNOTHERAPY ALERT CARD at check-in to the Emergency Department and triage nurse.  Should you have questions after your visit or need to cancel or reschedule your appointment, please contact [facility] - A DEPT OF [facility]  Dept: [phone]  and follow the prompts.  Office hours are 8:00 a.m. to 4:30 p.m. Monday - Friday. Please note that voicemails left after 4:00 p.m. may not be returned until the following business day.  We are closed weekends and major holidays. You have access to a nurse at all times for urgent questions. Please call the main number to the clinic Dept: [phone] and follow the prompts.   For any non-urgent questions, you may also contact your provider using MyChart. We now offer e-Visits for anyone 60 and older to request care online for non-urgent symptoms. For details visit [email or web address].   Also download the MyChart app! Go to the app store, search "MyChart", open the app, select [place], and log in with your MyChart username and password.  The chemotherapy medication bag should finish at 46 hours, 96 hours, or 7 days. For example, if your pump is scheduled for 46 hours and it was put on at 4:00 p.m., it should finish at 2:00 p.m. the day it is scheduled to come off regardless of your appointment time.     Estimated time to finish at approximately 12:00 on Thursday 04/22/24.    If the display on your pump reads "Low Volume" and it is beeping, take the batteries out of the pump and come to the  cancer center for it to be taken off.   If the pump alarms go off prior to the pump reading "Low Volume" then call [phone] and someone can assist you.  If the plunger comes out and the chemotherapy medication is leaking out, please use your home chemo spill kit to clean up the spill. Do NOT use paper towels or other household products.  If you have problems or questions regarding your pump, please call either  [phone] (24 hours a day) or the cancer center Monday-Friday 8:00 a.m.- 4:30 p.m. at the clinic number and we will assist you. If you are unable to get assistance, then go to the nearest Emergency Department and ask the staff to contact the IV team for assistance.

## 2024-04-20 NOTE — Progress Notes (Signed)
 [facility]   [phone] [phone]   Clinic Follow up Note   Patient Care Team: Benedetto Brady, MD as PCP - General (Family Medicine) Sonja Brentwood, MD as Consulting Physician (Hematology and Oncology)  Date of Service:  04/20/2024  CHIEF COMPLAINT: f/u of rectal cancer  CURRENT THERAPY:  Total neoadjuvant chemotherapy FOLFOX  Oncology History   Adenocarcinoma of rectum (HCC) -cT3bN1M0, stage IIIB, G2 -presented with rectal bleeding.  I reviewed her recent colonoscopy findings, biopsy results, and her staging CT images.  She has low rectal adenocarcinoma, CT is suspicious for nodal metastasis but no distant mets   -pelvic MRI 10/24/2023 showed T3N1 disease -pt was seen by surgeon Dr. Andy Bannister and rad/onc Dr. Jeryl Moris -she agrees with TNT, and started concurrent chemoRT on 11/03/2023 and completed on 12/17/2022 -she started chemo FOLFOX on 01/14/2024, plan for for 8 cycles , she will complete on 04/20/2024   Assessment & Plan Rectal cancer Rectal cancer under chemotherapy, currently in the final cycle. She reports no significant adverse effects. Blood tests indicate mild leukopenia, anemia, and thrombocytopenia, with normal renal and hepatic function. Imaging and physical examination will assess treatment response. If no residual cancer is detected, monitoring will continue. If residual cancer or lymphadenopathy is present, surgery will be considered, though she is hesitant. There is a favorable prognosis for complete response, with follow-up involving regular imaging and examinations. - Order MRI in early July to assess treatment response - Schedule follow-up appointment in the second week of July - Perform rectal exam during follow-up visit - Coordinate with surgeon for potential examination - Monitor blood counts and organ function  Pancytopenia - Secondary to chemotherapy, no need of blood transfusion, will monitor closely.  Plan - Patient is tolerating  chemotherapy well.  Lab reviewed, adequate for treatment, will proceed last cycle FOLFOX today at same dose. - Follow-up in 5 weeks with lab, flush and pelvic MRI 1 week before to evaluate response to treatment   SUMMARY OF ONCOLOGIC HISTORY: Oncology History  Adenocarcinoma of rectum (HCC)  09/24/2023 Imaging   CT chest abdomen and pelvis with contrast  IMPRESSION: 1. Rectal primary with nodal metastasis in the mesorectum and sigmoid mesocolon. 2. No distant metastasis. 3. Chronic left-sided hydronephrosis with overlying cortical thinning and decreased/absent left renal function. Favor chronic ureteropelvic junction obstruction, given absence of hydroureter. 4. 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 5. Incidental findings, including: Coronary artery atherosclerosis. Aortic Atherosclerosis (ICD10-I70.0). Cholelithiasis. Bilateral nephrolithiasis.   10/02/2023 Initial Diagnosis   Adenocarcinoma of rectum (HCC)   10/24/2023 Cancer Staging   Staging form: Colon and Rectum, AJCC 8th Edition - Clinical stage from 10/24/2023: Stage IIIB (cT3, cN1, cM0) - Signed by Sonja Destin, MD on 10/31/2023 Histologic grade (G): G2 Histologic grading system: 4 grade system   10/29/2023 Imaging   Pelvic MRI FINDINGS: TUMOR LOCATION  Tumor distance from Anal Verge/Skin surface: 11.1 cm.  Tumor distance to Internal Anal sphincter: 7.4 cm.  TUMOR DESCRIPTION  Circumferential extent: 9 to 6 o'clock position, 75% of the total circumference, along the superior aspect.   Tumor Size and volume: 4.6 cm craniocaudal. Up to 1.9 cm in thickness.   T - CATEGORY   Extension through Muscularis Propria: Yes 1-51mm=T3b (approximately 1.6-1.8 mm.)   Shortest Distance of any tumor/node from Mesorectal fascia: 1.4 cm.   Extramural Vascular Invasion/Tumor Thrombus: No.   Invasion of Anterior Peritoneal Reflection: No.  Involvement of Adjacent Organs or Pelvic  Sidewall: No.   Levator Ani Involvement: No.   N - CATEGORY   Mesorectal Lymph Nodes >=61mm: 1-3=N1   Extra-mesorectal Lymphadenopathy: No   Other: There are multiple sigmoid diverticula without imaging signs diverticulitis. Urinary bladder is decompressed. Surgically absent uterus. No large adnexal mass seen.   IMPRESSION: Rectal adenocarcinoma T stage: T3b   Rectal adenocarcinoma N stage: N1   Distance from tumor to the internal anal sphincter is 7.4 cm.       01/14/2024 -  Chemotherapy   Patient is on Treatment Plan : COLORECTAL FOLFOX q14d x 4 months        Discussed the use of AI scribe software for clinical note transcription with the patient, who gave verbal consent to proceed.  History of Present Illness Samantha Jenkins is an 83 year old female with rectal cancer who presents for follow-up.  She is on her last cycle of chemotherapy for rectal cancer. Her experience with radiation and chemotherapy has been manageable, with no adverse symptoms reported. She did not experience pain during needle insertion despite forgetting to apply lidocaine .     All other systems were reviewed with the patient and are negative.  MEDICAL HISTORY:  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   Depression    Dysthymia    FHx: cardiovascular disease    History of blood transfusion as child   History of kidney stones    Hyperlipidemia    Hypertension    LBBB (left bundle branch block) 2014   on ekg   Obesity    Pneumonia    hx of as a child   Vitamin D  deficiency     SURGICAL HISTORY: Past Surgical History:  Procedure Laterality Date   ABDOMINAL HYSTERECTOMY  1986   partial   APPENDECTOMY  1986   with hysterectomy   CATARACT EXTRACTION Bilateral 2016   CYSTOSCOPY/URETEROSCOPY/HOLMIUM LASER/STENT PLACEMENT Left 02/09/2021   Procedure: LEFT  URETEROSCOPY/ LASER LITHOTRIPSY  AND  STENT PLACEMENT,ENDOPYLOTOMY;  Surgeon: Andrez Banker, MD;  Location: [facility];  Service: Urology;  Laterality: Left;   CYSTOSCOPY/URETEROSCOPY/HOLMIUM LASER/STENT PLACEMENT Left 03/15/2021   Procedure: CYSTOSCOPY, LEFT URETEROSCOPY, HOLMIUM LASER ENDOPYELOTOMY, URETERAL STENT PLACEMENT;  Surgeon: Andrez Banker, MD;  Location: [facility];  Service: Urology;  Laterality: Left;   EYE SURGERY  2016   bil cataracts   HOLMIUM LASER APPLICATION Left 02/09/2021   Procedure: HOLMIUM LASER APPLICATION;  Surgeon: Andrez Banker, MD;  Location: [facility];  Service: Urology;  Laterality: Left;   KNEE ARTHROSCOPY  1995   left   NEPHROLITHOTOMY Left 03/23/2020   Procedure: LEFT NEPHROLITHOTOMY PERCUTANEOUS WITH SURGEON ACCESS;  Surgeon: Andrez Banker, MD;  Location: [facility];  Service: Urology;  Laterality: Left;   PORTACATH PLACEMENT N/A 01/07/2024   Procedure: PORT PLACEMENT WITH ULTRASOUND GUIDANCE;  Surgeon: Joyce Nixon, MD;  Location: [facility];  Service: General;  Laterality: N/A;   TONSILLECTOMY  1945   adenoids removed   TOTAL HIP ARTHROPLASTY Left 06/03/2023   Procedure: TOTAL HIP ARTHROPLASTY ANTERIOR APPROACH;  Surgeon: Claiborne Crew, MD;  Location: [facility];  Service: Orthopedics;  Laterality: Left;   TOTAL KNEE ARTHROPLASTY Right 06/06/2020   Procedure: TOTAL KNEE ARTHROPLASTY;  Surgeon: Claiborne Crew, MD;  Location: [facility];  Service:  Orthopedics;  Laterality: Right;  70 mins   TOTAL KNEE ARTHROPLASTY Left 07/17/2021   Procedure: TOTAL KNEE ARTHROPLASTY;  Surgeon: Claiborne Crew, MD;  Location: [facility];  Service: Orthopedics;  Laterality: Left;   TUBAL LIGATION  1979    I have reviewed the social history and family history with the patient and they are unchanged from previous note.  ALLERGIES:  is allergic to statins and sulfa antibiotics.  MEDICATIONS:  Current Outpatient Medications  Medication Sig Dispense  Refill   albuterol  (PROVENTIL  HFA;VENTOLIN  HFA) 108 (90 BASE) MCG/ACT inhaler Inhale 2 puffs into the lungs every 6 (six) hours as needed for wheezing or shortness of breath. 1 Inhaler 0   bacitracin-polymyxin b (POLYSPORIN) ointment Apply 1 application topically daily as needed (wound care).     bisoprolol -hydrochlorothiazide  (ZIAC ) 5-6.25 MG tablet TAKE 1 TABLET BY MOUTH DAILY. (Patient taking differently: Take 1 tablet by mouth at bedtime.) 90 tablet 0   diclofenac Sodium (VOLTAREN) 1 % GEL Apply 1 Application topically 2 (two) times daily as needed (pain.).     Emollient (UDDERLY SMOOTH EX) Apply 1 Application topically as needed (dry/irritated skin).     lidocaine -prilocaine  (EMLA ) cream Apply to affected area once 30 g 3   pravastatin  (PRAVACHOL ) 40 MG tablet TAKE 1 TABLET EVERY DAY (Patient taking differently: Take 40 mg by mouth every evening.) 90 tablet 0   Propylene Glycol, PF, (SYSTANE COMPLETE PF) 0.6 % SOLN Place 1 drop into both eyes daily.     sertraline  (ZOLOFT ) 100 MG tablet TAKE 1 TABLET EVERY DAY (Patient taking differently: Take 150 mg by mouth at bedtime.) 90 tablet 0   traZODone  (DESYREL ) 50 MG tablet Take 1 tablet (50 mg total) by mouth at bedtime as needed for sleep. 90 tablet 1   No current facility-administered medications for this visit.   Facility-Administered Medications Ordered in Other Visits  Medication Dose Route Frequency Provider Last Rate Last Admin   dextrose  5 % solution   Intravenous Continuous Sonja Fleming, MD   Stopped at 04/20/24 1410   fluorouracil  (ADRUCIL ) 3,500 mg in sodium chloride  0.9 % 80 mL chemo infusion  2,000 mg/m2 (Treatment Plan Recorded) Intravenous 1 day or 1 dose Sonja Simmesport, MD   Infusion Verify at 04/20/24 1423   sodium chloride  flush (NS) 0.9 % injection 10 mL  10 mL Intracatheter PRN Sonja Conway, MD   10 mL at 04/20/24 1410    PHYSICAL EXAMINATION: ECOG PERFORMANCE STATUS: 1 - Symptomatic but completely ambulatory  Vitals:   04/20/24  1043  BP: (!) 142/78  Pulse: 64  Resp: 17  Temp: (!) 97.2 F (36.2 C)  SpO2: 98%   Wt Readings from Last 3 Encounters:  04/20/24 167 lb 3.2 oz (75.8 kg)  04/06/24 167 lb 8 oz (76 kg)  03/23/24 167 lb 11.2 oz (76.1 kg)     GENERAL:alert, no distress and comfortable SKIN: skin color, texture, turgor are normal, no rashes or significant lesions EYES: normal, Conjunctiva are pink and non-injected, sclera clear NECK: supple, thyroid normal size, non-tender, without nodularity LYMPH:  no palpable lymphadenopathy in the cervical, axillary  LUNGS: clear to auscultation and percussion with normal breathing effort HEART: regular rate & rhythm and no murmurs and no lower extremity edema ABDOMEN:abdomen soft, non-tender and normal bowel sounds Musculoskeletal:no cyanosis of digits and no clubbing  NEURO: alert & oriented x 3 with fluent speech, no focal motor/sensory deficits  Physical Exam    LABORATORY DATA:  I have reviewed the data as  listed    Latest Ref Rng & Units 04/20/2024    9:57 AM 04/06/2024   10:16 AM 03/23/2024    9:36 AM  CBC  WBC 4.0 - 10.5 K/uL 2.6  2.5  2.4   Hemoglobin 12.0 - 15.0 g/dL 16.1  09.6  04.5   Hematocrit 36.0 - 46.0 % 31.4  31.5  31.4   Platelets 150 - 400 K/uL 81  76  82         Latest Ref Rng & Units 04/20/2024    9:57 AM 04/06/2024   10:16 AM 03/23/2024    9:36 AM  CMP  Glucose 70 - 99 mg/dL 409  811  914   BUN 8 - 23 mg/dL 8  12  8    Creatinine 0.44 - 1.00 mg/dL 7.82  9.56  2.13   Sodium 135 - 145 mmol/L 143  141  144   Potassium 3.5 - 5.1 mmol/L 3.9  3.6  3.3   Chloride 98 - 111 mmol/L 109  108  110   CO2 22 - 32 mmol/L 28  28  28    Calcium  8.9 - 10.3 mg/dL 9.2  9.2  9.2   Total Protein 6.5 - 8.1 g/dL 6.6  6.4  6.4   Total Bilirubin 0.0 - 1.2 mg/dL 0.7  0.6  0.5   Alkaline Phos 38 - 126 U/L 98  104  93   AST 15 - 41 U/L 24  23  27    ALT 0 - 44 U/L 9  9  11        RADIOGRAPHIC STUDIES: I have personally reviewed the radiological images as  listed and agreed with the findings in the report. No results found.    Orders Placed This Encounter  Procedures   MR PELVIS WO CM RECTAL CA STAGING    Standing Status:   Future    Expected Date:   05/20/2024    Expiration Date:   04/20/2025    If indicated for the ordered procedure, I authorize the administration of contrast media per Radiology protocol:   Yes    What is the patient's sedation requirement?:   No Sedation    Does the patient have a pacemaker or implanted devices?:   No    Preferred imaging location?:   [facility] (table limit - 550 lbs)   All questions were answered. The patient knows to call the clinic with any problems, questions or concerns. No barriers to learning was detected. The total time spent in the appointment was 30 minutes, including review of chart and various tests results, discussions about plan of care and coordination of care plan     Sonja Ravensdale, MD 04/20/2024

## 2024-04-22 ENCOUNTER — Other Ambulatory Visit: Payer: Self-pay

## 2024-04-22 ENCOUNTER — Inpatient Hospital Stay

## 2024-04-22 VITALS — BP 159/71 | HR 69 | Temp 98.4°F | Resp 18

## 2024-04-22 DIAGNOSIS — Z5111 Encounter for antineoplastic chemotherapy: Secondary | ICD-10-CM | POA: Diagnosis not present

## 2024-04-22 DIAGNOSIS — Z95828 Presence of other vascular implants and grafts: Secondary | ICD-10-CM

## 2024-04-22 MED ORDER — HEPARIN SOD (PORK) LOCK FLUSH 100 UNIT/ML IV SOLN
500.0000 [IU] | Freq: Once | INTRAVENOUS | Status: AC
  Administered 2024-04-22: 500 [IU]

## 2024-04-22 MED ORDER — SODIUM CHLORIDE 0.9% FLUSH
10.0000 mL | Freq: Once | INTRAVENOUS | Status: AC
  Administered 2024-04-22: 10 mL

## 2024-05-17 ENCOUNTER — Ambulatory Visit (HOSPITAL_COMMUNITY)
Admission: RE | Admit: 2024-05-17 | Discharge: 2024-05-17 | Disposition: A | Discharge status: Home / Self Care | Source: Ambulatory Visit | Attending: Hematology | Admitting: Hematology

## 2024-05-17 DIAGNOSIS — N3289 Other specified disorders of bladder: Secondary | ICD-10-CM | POA: Diagnosis not present

## 2024-05-17 DIAGNOSIS — Z90710 Acquired absence of both cervix and uterus: Secondary | ICD-10-CM | POA: Diagnosis not present

## 2024-05-17 DIAGNOSIS — C20 Malignant neoplasm of rectum: Secondary | ICD-10-CM | POA: Insufficient documentation

## 2024-05-17 DIAGNOSIS — K5730 Diverticulosis of large intestine without perforation or abscess without bleeding: Secondary | ICD-10-CM | POA: Diagnosis not present

## 2024-05-20 ENCOUNTER — Inpatient Hospital Stay: Attending: Radiation Oncology

## 2024-05-20 DIAGNOSIS — C20 Malignant neoplasm of rectum: Secondary | ICD-10-CM | POA: Insufficient documentation

## 2024-05-20 DIAGNOSIS — R630 Anorexia: Secondary | ICD-10-CM | POA: Diagnosis not present

## 2024-05-20 DIAGNOSIS — Z95828 Presence of other vascular implants and grafts: Secondary | ICD-10-CM

## 2024-05-20 LAB — CBC WITH DIFFERENTIAL (CANCER CENTER ONLY)
Abs Immature Granulocytes: 0.01 10*3/uL (ref 0.00–0.07)
Basophils Absolute: 0 10*3/uL (ref 0.0–0.1)
Basophils Relative: 0 %
Eosinophils Absolute: 0.1 10*3/uL (ref 0.0–0.5)
Eosinophils Relative: 3 %
HCT: 34.5 % — ABNORMAL LOW (ref 36.0–46.0)
Hemoglobin: 11.5 g/dL — ABNORMAL LOW (ref 12.0–15.0)
Immature Granulocytes: 0 %
Lymphocytes Relative: 22 %
Lymphs Abs: 0.9 10*3/uL (ref 0.7–4.0)
MCH: 34.4 pg — ABNORMAL HIGH (ref 26.0–34.0)
MCHC: 33.3 g/dL (ref 30.0–36.0)
MCV: 103.3 fL — ABNORMAL HIGH (ref 80.0–100.0)
Monocytes Absolute: 0.5 10*3/uL (ref 0.1–1.0)
Monocytes Relative: 12 %
Neutro Abs: 2.5 10*3/uL (ref 1.7–7.7)
Neutrophils Relative %: 63 %
Platelet Count: 117 10*3/uL — ABNORMAL LOW (ref 150–400)
RBC: 3.34 MIL/uL — ABNORMAL LOW (ref 3.87–5.11)
RDW: 15.9 % — ABNORMAL HIGH (ref 11.5–15.5)
WBC Count: 4.1 10*3/uL (ref 4.0–10.5)
nRBC: 0 % (ref 0.0–0.2)

## 2024-05-20 LAB — CMP (CANCER CENTER ONLY)
ALT: 9 U/L (ref 0–44)
AST: 25 U/L (ref 15–41)
Albumin: 3.6 g/dL (ref 3.5–5.0)
Alkaline Phosphatase: 117 U/L (ref 38–126)
Anion gap: 6 (ref 5–15)
BUN: 7 mg/dL — ABNORMAL LOW (ref 8–23)
CO2: 27 mmol/L (ref 22–32)
Calcium: 9.5 mg/dL (ref 8.9–10.3)
Chloride: 108 mmol/L (ref 98–111)
Creatinine: 0.83 mg/dL (ref 0.44–1.00)
GFR, Estimated: >60 mL/min (ref >60)
Glucose, Bld: 101 mg/dL — ABNORMAL HIGH (ref 70–99)
Potassium: 4 mmol/L (ref 3.5–5.1)
Sodium: 141 mmol/L (ref 135–145)
Total Bilirubin: 0.7 mg/dL (ref 0.0–1.2)
Total Protein: 6.7 g/dL (ref 6.5–8.1)

## 2024-05-20 MED ORDER — HEPARIN SOD (PORK) LOCK FLUSH 100 UNIT/ML IV SOLN
500.0000 [IU] | Freq: Once | INTRAVENOUS | Status: AC
  Administered 2024-05-20: 500 [IU]

## 2024-05-20 MED ORDER — SODIUM CHLORIDE 0.9% FLUSH
10.0000 mL | Freq: Once | INTRAVENOUS | Status: AC
  Administered 2024-05-20: 10 mL

## 2024-05-24 ENCOUNTER — Ambulatory Visit: Payer: Self-pay | Admitting: Nurse Practitioner

## 2024-05-26 ENCOUNTER — Other Ambulatory Visit: Payer: Self-pay | Admitting: Nurse Practitioner

## 2024-05-26 DIAGNOSIS — C20 Malignant neoplasm of rectum: Secondary | ICD-10-CM

## 2024-05-26 NOTE — Progress Notes (Unsigned)
 Patient Care Team: Leonel Cole, MD as PCP - General (Family Medicine) Lanny Callander, MD as Consulting Physician (Hematology and Oncology)  Clinic Day:  05/26/2024  Referring physician: Leonel Cole, MD  ASSESSMENT & PLAN:   Assessment & Plan: Adenocarcinoma of rectum (HCC) -cT3bN1M0, stage IIIB, G2 -presented with rectal bleeding.  I reviewed her recent colonoscopy findings, biopsy results, and her staging CT images.  She has low rectal adenocarcinoma, CT is suspicious for nodal metastasis but no distant mets   -pelvic MRI 10/24/2023 showed T3N1 disease -pt was seen by surgeon Dr. Debby and rad/onc Dr. Dewey -she agrees with TNT, and started concurrent chemoRT on 11/03/2023 and completed on 12/17/2022 -she started chemo FOLFOX on 01/14/2024, plan for for 8 cycles , she will complete on 04/20/2024  - 05/17/2024 -she had pelvic MRI for restaging.  There did not appear to be local residual tumor along the right wall of the upper rectum.    The patient understands the plans discussed today and is in agreement with them.  She knows to contact our office if she develops concerns prior to her next appointment.  I provided *** minutes of face-to-face time during this encounter and > 50% was spent counseling as documented under my assessment and plan.    Powell FORBES Lessen, NP  [facility] - A DEPT OF [facility] [address] [place] [address] Dept: [phone] Dept Fax: [phone]   No orders of the defined types were placed in this encounter.     CHIEF COMPLAINT:  CC: Adenocarcinoma of the rectum  Current Treatment: Total neoadjuvant chemotherapy FOLFOX  INTERVAL HISTORY:  Samantha Jenkins is here today for repeat clinical assessment.  She last saw Dr. Lanny on 04/20/2024.  She had restaging pelvic MRI on 05/17/2024.  There does appear to be some residual tumor along the right wall of the upper rectum. Correlation with sigmoidoscopy  and tissue sampling is recommended. denies fevers or chills. She denies pain. Her appetite is good. Her weight {Weight change:10426}.  I have reviewed the past medical history, past surgical history, social history and family history with the patient and they are unchanged from previous note.  ALLERGIES:  is allergic to statins and sulfa antibiotics.  MEDICATIONS:  Current Outpatient Medications  Medication Sig Dispense Refill   albuterol  (PROVENTIL  HFA;VENTOLIN  HFA) 108 (90 BASE) MCG/ACT inhaler Inhale 2 puffs into the lungs every 6 (six) hours as needed for wheezing or shortness of breath. 1 Inhaler 0   bacitracin-polymyxin b (POLYSPORIN) ointment Apply 1 application topically daily as needed (wound care).     bisoprolol -hydrochlorothiazide  (ZIAC ) 5-6.25 MG tablet TAKE 1 TABLET BY MOUTH DAILY. (Patient taking differently: Take 1 tablet by mouth at bedtime.) 90 tablet 0   diclofenac Sodium (VOLTAREN) 1 % GEL Apply 1 Application topically 2 (two) times daily as needed (pain.).     Emollient (UDDERLY SMOOTH EX) Apply 1 Application topically as needed (dry/irritated skin).     lidocaine -prilocaine  (EMLA ) cream Apply to affected area once 30 g 3   pravastatin  (PRAVACHOL ) 40 MG tablet TAKE 1 TABLET EVERY DAY (Patient taking differently: Take 40 mg by mouth every evening.) 90 tablet 0   Propylene Glycol, PF, (SYSTANE COMPLETE PF) 0.6 % SOLN Place 1 drop into both eyes daily.     sertraline  (ZOLOFT ) 100 MG tablet TAKE 1 TABLET EVERY DAY (Patient taking differently: Take 150 mg by mouth at bedtime.) 90 tablet 0  traZODone  (DESYREL ) 50 MG tablet Take 1 tablet (50 mg total) by mouth at bedtime as needed for sleep. 90 tablet 1   No current facility-administered medications for this visit.    HISTORY OF PRESENT ILLNESS:   Oncology History  Adenocarcinoma of rectum (HCC)  09/24/2023 Imaging   CT chest abdomen and pelvis with contrast  IMPRESSION: 1. Rectal primary with nodal metastasis in the  mesorectum and sigmoid mesocolon. 2. No distant metastasis. 3. Chronic left-sided hydronephrosis with overlying cortical thinning and decreased/absent left renal function. Favor chronic ureteropelvic junction obstruction, given absence of hydroureter. 4. 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 5. Incidental findings, including: Coronary artery atherosclerosis. Aortic Atherosclerosis (ICD10-I70.0). Cholelithiasis. Bilateral nephrolithiasis.   10/02/2023 Initial Diagnosis   Adenocarcinoma of rectum (HCC)   10/24/2023 Cancer Staging   Staging form: Colon and Rectum, AJCC 8th Edition - Clinical stage from 10/24/2023: Stage IIIB (cT3, cN1, cM0) - Signed by Lanny Callander, MD on 10/31/2023 Histologic grade (G): G2 Histologic grading system: 4 grade system   10/29/2023 Imaging   Pelvic MRI FINDINGS: TUMOR LOCATION  Tumor distance from Anal Verge/Skin surface: 11.1 cm.  Tumor distance to Internal Anal sphincter: 7.4 cm.  TUMOR DESCRIPTION  Circumferential extent: 9 to 6 o'clock position, 75% of the total circumference, along the superior aspect.   Tumor Size and volume: 4.6 cm craniocaudal. Up to 1.9 cm in thickness.   T - CATEGORY   Extension through Muscularis Propria: Yes 1-46mm=T3b (approximately 1.6-1.8 mm.)   Shortest Distance of any tumor/node from Mesorectal fascia: 1.4 cm.   Extramural Vascular Invasion/Tumor Thrombus: No.   Invasion of Anterior Peritoneal Reflection: No.  Involvement of Adjacent Organs or Pelvic Sidewall: No.   Levator Ani Involvement: No.   N - CATEGORY   Mesorectal Lymph Nodes >=51mm: 1-3=N1   Extra-mesorectal Lymphadenopathy: No   Other: There are multiple sigmoid diverticula without imaging signs diverticulitis. Urinary bladder is decompressed. Surgically absent uterus. No large adnexal mass seen.   IMPRESSION: Rectal adenocarcinoma T stage: T3b   Rectal adenocarcinoma N stage: N1   Distance from tumor to  the internal anal sphincter is 7.4 cm.       01/14/2024 -  Chemotherapy   Patient is on Treatment Plan : COLORECTAL FOLFOX q14d x 4 months     05/17/2024 Imaging   Pelvic MRI WO CM rectal cancer staging IMPRESSION: 1. Please note diffusion-weighted images are nondiagnostic due to susceptibility is artifact from the left hip hardware. 2. There is focal irregular thickening along the right wall of the upper rectum as described above which may represent residual viable tumor. Correlation with sigmoidoscopy and tissue sampling is recommended. 3. In the upper rectum from 12 to 3 o'clock position there is mild focal thickening (up to 6 mm) which is predominantly T2 dark and favored to represent treated tumor. 4. There is a stable well-circumscribed round approximately 8 x 8 mm sized perirectal lymph node. No new pathologically enlarged lymph nodes.       REVIEW OF SYSTEMS:   Constitutional: Denies fevers, chills or abnormal weight loss Eyes: Denies blurriness of vision Ears, nose, mouth, throat, and face: Denies mucositis or sore throat Respiratory: Denies cough, dyspnea or wheezes Cardiovascular: Denies palpitation, chest discomfort or lower extremity swelling Gastrointestinal:  Denies nausea, heartburn or change in bowel habits Skin: Denies abnormal skin rashes Lymphatics: Denies new lymphadenopathy or easy bruising Neurological:Denies numbness, tingling or new weaknesses Behavioral/Psych: Mood is stable, no new changes  All other systems were reviewed with the patient and are negative.   VITALS:  There were no vitals taken for this visit.  Wt Readings from Last 3 Encounters:  04/20/24 167 lb 3.2 oz (75.8 kg)  04/06/24 167 lb 8 oz (76 kg)  03/23/24 167 lb 11.2 oz (76.1 kg)    There is no height or weight on file to calculate BMI.  Performance status (ECOG): {CHL ONC [id number]  PHYSICAL EXAM:   GENERAL:alert, no distress and comfortable SKIN: skin color, texture, turgor  are normal, no rashes or significant lesions EYES: normal, Conjunctiva are pink and non-injected, sclera clear OROPHARYNX:no exudate, no erythema and lips, buccal mucosa, and tongue normal  NECK: supple, thyroid normal size, non-tender, without nodularity LYMPH:  no palpable lymphadenopathy in the cervical, axillary or inguinal LUNGS: clear to auscultation and percussion with normal breathing effort HEART: regular rate & rhythm and no murmurs and no lower extremity edema ABDOMEN:abdomen soft, non-tender and normal bowel sounds Musculoskeletal:no cyanosis of digits and no clubbing  NEURO: alert & oriented x 3 with fluent speech, no focal motor/sensory deficits  LABORATORY DATA:  I have reviewed the data as listed    Component Value Date/Time   NA 141 05/20/2024 1126   K 4.0 05/20/2024 1126   CL 108 05/20/2024 1126   CO2 27 05/20/2024 1126   GLUCOSE 101 (H) 05/20/2024 1126   BUN 7 (L) 05/20/2024 1126   CREATININE 0.83 05/20/2024 1126   CREATININE 0.71 03/30/2015 0951   CALCIUM  9.5 05/20/2024 1126   PROT 6.7 05/20/2024 1126   ALBUMIN 3.6 05/20/2024 1126   AST 25 05/20/2024 1126   ALT 9 05/20/2024 1126   ALKPHOS 117 05/20/2024 1126   BILITOT 0.7 05/20/2024 1126   GFRNONAA >60 05/20/2024 1126   GFRAA 46 (L) 06/07/2020 0304    No results found for: SPEP, UPEP  Lab Results  Component Value Date   WBC 4.1 05/20/2024   NEUTROABS 2.5 05/20/2024   HGB 11.5 (L) 05/20/2024   HCT 34.5 (L) 05/20/2024   MCV 103.3 (H) 05/20/2024   PLT 117 (L) 05/20/2024      Chemistry      Component Value Date/Time   NA 141 05/20/2024 1126   K 4.0 05/20/2024 1126   CL 108 05/20/2024 1126   CO2 27 05/20/2024 1126   BUN 7 (L) 05/20/2024 1126   CREATININE 0.83 05/20/2024 1126   CREATININE 0.71 03/30/2015 0951      Component Value Date/Time   CALCIUM  9.5 05/20/2024 1126   ALKPHOS 117 05/20/2024 1126   AST 25 05/20/2024 1126   ALT 9 05/20/2024 1126   BILITOT 0.7 05/20/2024 1126        RADIOGRAPHIC STUDIES: I have personally reviewed the radiological images as listed and agreed with the findings in the report. MR PELVIS WO CM RECTAL CA STAGING Result Date: 05/17/2024 CLINICAL DATA:  Rectal cancer, assess treatment response. EXAM: MRI PELVIS WITHOUT CONTRAST TECHNIQUE: Multiplanar multisequence MR imaging of the pelvis was performed. No intravenous contrast was administered. COMPARISON:  MRI pelvis from 10/24/2023. FINDINGS: Urinary Tract: Urinary bladder is minimally distended precluding optimal assessment. No focal mass. Bowel: Multiple sigmoid diverticula noted without imaging signs of diverticulitis. No disproportionate dilation of visualized bowel loops. Please note diffusion-weighted images are nondiagnostic due to extensive susceptibility artifact from left hip hardware. There is focal irregular thickening along the right wall of the upper rectum from 6:00 to 12:00 o'clock position which measures up to 15 mm (series 8, image  19) in thickness and up to 3.6 cm in length. The thickening exhibits predominantly T2 hypointense signals however, there are areas which exhibit mildly T2 hyperintense signal and may represent residual tumor. In the upper rectum from 12 to 3 o'clock position there is mild focal thickening (up to 6 mm) which is predominantly T2 dark and favored to represent treated tumor. Vascular/Lymphatic: There is a stable well-circumscribed round approximately 8 x 8 mm sized perirectal lymph node. No new pathologically enlarged lymph nodes. No significant vascular abnormality seen. Reproductive: Patient is status post hysterectomy. No focal cervical or vaginal lesion seen. Other:  None. Musculoskeletal: No suspicious bone lesions identified. IMPRESSION: 1. Please note diffusion-weighted images are nondiagnostic due to susceptibility is artifact from the left hip hardware. 2. There is focal irregular thickening along the right wall of the upper rectum as described above  which may represent residual viable tumor. Correlation with sigmoidoscopy and tissue sampling is recommended. 3. In the upper rectum from 12 to 3 o'clock position there is mild focal thickening (up to 6 mm) which is predominantly T2 dark and favored to represent treated tumor. 4. There is a stable well-circumscribed round approximately 8 x 8 mm sized perirectal lymph node. No new pathologically enlarged lymph nodes. Electronically Signed   By: Ree Molt M.D.   On: 05/17/2024 17:12

## 2024-05-26 NOTE — Assessment & Plan Note (Signed)
-  [id number], stage IIIB, G2 -presented with rectal bleeding.  I reviewed her recent colonoscopy findings, biopsy results, and her staging CT images.  She has low rectal adenocarcinoma, CT is suspicious for nodal metastasis but no distant mets   -pelvic MRI 10/24/2023 showed T3N1 disease -pt was seen by surgeon Dr. Debby and rad/onc Dr. Dewey -she agrees with TNT, and started concurrent chemoRT on 11/03/2023 and completed on 12/17/2022 -she started chemo FOLFOX on 01/14/2024, plan for for 8 cycles , she will complete on 04/20/2024  - 05/17/2024 -she had pelvic MRI for restaging.  There did not appear to be local residual tumor along the right wall of the upper rectum.

## 2024-05-27 ENCOUNTER — Ambulatory Visit: Admitting: Nurse Practitioner

## 2024-05-27 ENCOUNTER — Other Ambulatory Visit: Payer: Self-pay

## 2024-05-27 VITALS — BP 138/78 | HR 79 | Temp 98.1°F | Resp 17 | Wt 163.0 lb

## 2024-05-27 DIAGNOSIS — C20 Malignant neoplasm of rectum: Secondary | ICD-10-CM | POA: Diagnosis not present

## 2024-05-31 ENCOUNTER — Telehealth: Payer: Self-pay

## 2024-05-31 NOTE — Telephone Encounter (Signed)
-----   Message from Norleen Kiang sent at 05/28/2024  8:13 AM EDT ----- That's helpful. Thanks.  Trequan Marsolek, Set her up for Flex sig with me, next available, in [facility]. Thanks. JP ----- Message ----- From: Hanford Powell BRAVO, NP Sent: 05/27/2024   5:56 PM EDT To: Norleen LOISE Kiang, MD; Onita Mattock, MD  Hi Dr. Kiang.  She has completed total neoadjuvant chemotherapy. At this point, she is not scheduled to have surgery. Our hope, is that what appears to be residual tumor, is treated tumor. The goal from the additional sigmoidoscopy, is to determine if there is residual cancer. If there is, she would need to be evaluated by surgery. If biopsy obtained is negative for residual cancer, we would continue cancer surveillance. She does not wish to have surgery unless the cancer has persisted through TNT.  Karyle, NP ----- Message ----- From: Kiang Norleen LOISE, MD Sent: 05/27/2024   4:58 PM EDT To: Powell BRAVO Hanford, NP; Norleen LOISE Kiang, MD; Onita #  Hi all. Reviewed. 1.  It appears that she has completed neoadjuvant chemoradiation therapy. 2.  I cannot tell if she is going to have surgery, or not.  Is she going to have surgery? 3.  How will the determination of whether she has residual tumor, or not, affect management moving forward?  In terms of surgery?  In terms of additional treatment?  In terms of other? Thanks, Dr. Kiang ----- Message ----- From: Hanford Powell BRAVO, NP Sent: 05/27/2024  12:59 PM EDT To: Norleen LOISE Kiang, MD; Onita Mattock, MD  Good afternoon.  I saw this patient earlier today, along with Dr. Mattock, for follow up of rectal adenocarcinoma. She had pelvic MRI on 05/17/2024 for surveillance/restaging. The MRI shows focal irregular thickening along the right wall of the upper rectum which may represent residual viable tumor. The recommendation is for correlation with sigmoidoscopy and tissue sampling. In the upper rectum from 12 to 3 o'clock position there is mild focal thickening (up to 6 mm) which is predominantly T2  dark and favored to represent treated tumor. There is also  a stable well-circumscribed round approximately 8 x 8 mm sized perirectal lymph node. No new pathologically enlarged lymph nodes. We discussed having this procedure done by GI versus surgery. She has asked that you perform this procedure. Are you able to schedule her for this procedure in the very near future?  Thank you for your time. Warm regards, Powell Hanford, NP (supporting Dr. Mattock) [facility]

## 2024-05-31 NOTE — Telephone Encounter (Signed)
 Pt scheduled for telephone previsit 06/03/24@10am . Flex-sig scheduled in the [facility] with Dr Abran 06/07/24@12 :30pm. Pt aware of appts.

## 2024-06-01 ENCOUNTER — Telehealth: Payer: Self-pay | Admitting: Nurse Practitioner

## 2024-06-01 ENCOUNTER — Other Ambulatory Visit: Payer: Self-pay

## 2024-06-01 ENCOUNTER — Encounter: Payer: Self-pay | Admitting: Hematology

## 2024-06-01 NOTE — Telephone Encounter (Signed)
 Scheduled appointments per 7/11 los. Talked with the patient and she is aware of the made appointments.

## 2024-06-02 ENCOUNTER — Other Ambulatory Visit: Payer: Self-pay

## 2024-06-02 ENCOUNTER — Telehealth: Payer: Self-pay | Admitting: *Deleted

## 2024-06-02 NOTE — Telephone Encounter (Signed)
 Patient scheduled for flex sig in error with Dr Albertus on 07/21.   Rescheduled for 07/29  @ 9:00 with Dr Abran. By Carlean Mania, RN Left patient detailed message explaining new appointment date. Patient has video PV tomorrow 06/03/24

## 2024-06-02 NOTE — Telephone Encounter (Signed)
 Pts flex-sig rescheduled with Dr Abran for 06/15/24@9am . Previous appt was scheduled with Dr Albertus by mistake and has been cancelled.

## 2024-06-03 ENCOUNTER — Ambulatory Visit (AMBULATORY_SURGERY_CENTER)

## 2024-06-03 ENCOUNTER — Encounter: Payer: Self-pay | Admitting: Internal Medicine

## 2024-06-03 VITALS — Ht 65.0 in | Wt 160.0 lb

## 2024-06-03 DIAGNOSIS — Z85048 Personal history of other malignant neoplasm of rectum, rectosigmoid junction, and anus: Secondary | ICD-10-CM

## 2024-06-03 DIAGNOSIS — C20 Malignant neoplasm of rectum: Secondary | ICD-10-CM

## 2024-06-03 MED ORDER — MAGNESIUM CITRATE PO SOLN: 1.0000 | Freq: Once | ORAL | 0 refills | Status: AC

## 2024-06-03 MED ORDER — FLEET ENEMA RE ENEM: 1.0000 | ENEMA | Freq: Once | RECTAL | 0 refills | Status: AC

## 2024-06-03 NOTE — Progress Notes (Signed)
 No egg or soy allergy known to patient  No issues known to pt with past sedation with any surgeries or procedures Patient denies ever being told they had issues or difficulty with intubation  No FH of Malignant Hyperthermia Pt is not on diet pills nor GLP-1 medications Pt is not on home 02  Pt is not on blood thinners  Pt denies issues with constipation  No A fib or A flutter Have any cardiac testing pending--no Pt instructed to use [email or web address] or GoodRx for a price reduction on prep  Ambulates with a walker

## 2024-06-07 ENCOUNTER — Other Ambulatory Visit: Admitting: Internal Medicine

## 2024-06-10 ENCOUNTER — Telehealth: Payer: Self-pay | Admitting: Internal Medicine

## 2024-06-10 NOTE — Telephone Encounter (Signed)
 Patient is calling about her procedure schedule for the 29 of July and is requesting to cancel her procedure do her her not being about to give herself an enema. Patient stated that she would like to speak to some one who can help her get this procedure done sooner. Patient is requesting a call back. Please advise.

## 2024-06-11 NOTE — Telephone Encounter (Signed)
 I have spoken to patient who states that she just has a hard time getting going in the mornings and is unable to keep the 06/15/24 9 am appointment for flexible sigmoidoscopy. She requests a later time.   Patient has been rescheduled to 07/06/24 at 3 pm. She verbalizes understanding of this as well as the need to have a care giver 18 years or older to bring her, stay for procedure and take her home due to sedation.   Patient expresses her thanks to Dr Abran and [facility] staff for always helping her.

## 2024-06-15 ENCOUNTER — Other Ambulatory Visit: Admitting: Internal Medicine

## 2024-07-06 ENCOUNTER — Encounter: Payer: Self-pay | Admitting: Internal Medicine

## 2024-07-06 ENCOUNTER — Ambulatory Visit: Admitting: Internal Medicine

## 2024-07-06 VITALS — BP 141/69 | HR 56 | Temp 97.6°F | Resp 17 | Ht 65.0 in | Wt 160.0 lb

## 2024-07-06 DIAGNOSIS — Z09 Encounter for follow-up examination after completed treatment for conditions other than malignant neoplasm: Secondary | ICD-10-CM | POA: Diagnosis not present

## 2024-07-06 DIAGNOSIS — Z1211 Encounter for screening for malignant neoplasm of colon: Secondary | ICD-10-CM | POA: Diagnosis not present

## 2024-07-06 DIAGNOSIS — Z85048 Personal history of other malignant neoplasm of rectum, rectosigmoid junction, and anus: Secondary | ICD-10-CM

## 2024-07-06 DIAGNOSIS — C20 Malignant neoplasm of rectum: Secondary | ICD-10-CM

## 2024-07-06 MED ORDER — SODIUM CHLORIDE 0.9 % IV SOLN: 500.0000 mL | Freq: Once | INTRAVENOUS | Status: DC

## 2024-07-06 NOTE — Progress Notes (Signed)
 Pt's states no medical or surgical changes since previsit or office visit.

## 2024-07-06 NOTE — Op Note (Signed)
 [facility] Patient Name: Samantha Jenkins Procedure Date: 07/06/2024 4:03 PM MRN: [id number] Endoscopist: Norleen SAILOR. Abran , MD, [id number] Age: 83 Referring MD:  Date of Birth: 01-16-1941 Gender: Female Account #: [id number] Procedure:                Flexible Sigmoidoscopy with biopsies Indications:              High risk colon cancer surveillance: Personal                            history of colon cancer (rectal cancer November                            2024) status post neoadjuvant therapy. Now for                            flexible sigmoidoscopy to assess for residual tumor Medicines:                Monitored Anesthesia Care Procedure:                Pre-Anesthesia Assessment:                           - Prior to the procedure, a History and Physical                            was performed, and patient medications and                            allergies were reviewed. The patient's tolerance of                            previous anesthesia was also reviewed. The risks                            and benefits of the procedure and the sedation                            options and risks were discussed with the patient.                            All questions were answered, and informed consent                            was obtained. Prior Anticoagulants: The patient has                            taken no anticoagulant or antiplatelet agents. ASA                            Grade Assessment: II - A patient with mild systemic                            disease. After reviewing the risks and benefits,  the patient was deemed in satisfactory condition to                            undergo the procedure.                           After obtaining informed consent, the scope was                            passed under direct vision. The Olympus Scope SN                            [id number] was introduced through the anus and                             advanced to the the sigmoid colon. The flexible                            sigmoidoscopy was accomplished without difficulty.                            The patient tolerated the procedure well. The                            quality of the bowel preparation was excellent. Scope In: 4:18:11 PM Scope Out: 4:25:19 PM Total Procedure Duration: 0 hours 7 minutes 8 seconds  Findings:                 An ulcerated mass was found in the rectum. This was                            located in the left anterolateral portion of the                            rectum approximately 4 cm in from the anal verge.                            The lesion was much smaller than on initial                            diagnosis and measured approximately 2 x 3 cm. This                            was biopsied with a cold forceps for histology. The                            area of the examined colon above and below this                            lesion was normal. See images Complications:            No immediate complications. Estimated Blood Loss:     Estimated blood loss: none. Estimated blood loss:  none. Impression:               - Malignant tumor in the rectum. Biopsied. Recommendation:           - Await pathology results.                           - Return to the care of your oncology team Norleen SAILOR. Abran, MD 07/06/2024 4:40:51 PM This report has been signed electronically.

## 2024-07-06 NOTE — Progress Notes (Signed)
 HISTORY OF PRESENT ILLNESS:  Samantha Jenkins is a 83 y.o. female who has completed total neoadjuvant chemotherapy. At this point, she is not scheduled to have surgery.  Oncology request flexible sigmoidoscopy and was hoping that what appears to be residual tumor, is treated tumor. The goal from the additional sigmoidoscopy, is to determine if there is residual cancer. If there is, she would need to be evaluated by surgery. If biopsy obtained is negative for residual cancer, we would continue cancer surveillance. She does not wish to have surgery unless the cancer has persisted through TNT.   REVIEW OF SYSTEMS:  All non-GI ROS negative except for  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   Chronic kidney disease    Depression    Dysthymia    FHx: cardiovascular disease    History of blood transfusion as child   History of kidney stones    Hyperlipidemia    Hypertension    LBBB (left bundle branch block) 2014   on ekg   Non-functioning kidney    left kidney non functioning   Obesity    Pneumonia    hx of as a child   Vitamin D  deficiency     Past Surgical History:  Procedure Laterality Date   ABDOMINAL HYSTERECTOMY  1986   partial   APPENDECTOMY  1986   with hysterectomy   CATARACT EXTRACTION Bilateral 2016   COLONOSCOPY     CYSTOSCOPY/URETEROSCOPY/HOLMIUM LASER/STENT PLACEMENT Left 02/09/2021   Procedure: LEFT URETEROSCOPY/ LASER LITHOTRIPSY  AND  STENT PLACEMENT,ENDOPYLOTOMY;  Surgeon: Cam Morene ORN, MD;  Location: [facility];  Service: Urology;  Laterality: Left;   CYSTOSCOPY/URETEROSCOPY/HOLMIUM LASER/STENT PLACEMENT Left 03/15/2021   Procedure: CYSTOSCOPY, LEFT URETEROSCOPY, HOLMIUM LASER ENDOPYELOTOMY, URETERAL STENT PLACEMENT;  Surgeon: Cam Morene ORN, MD;  Location: [facility];  Service: Urology;  Laterality: Left;    EYE SURGERY  2016   bil cataracts   HOLMIUM LASER APPLICATION Left 02/09/2021   Procedure: HOLMIUM LASER APPLICATION;  Surgeon: Cam Morene ORN, MD;  Location: [facility];  Service: Urology;  Laterality: Left;   KNEE ARTHROSCOPY  1995   left   NEPHROLITHOTOMY Left 03/23/2020   Procedure: LEFT NEPHROLITHOTOMY PERCUTANEOUS WITH SURGEON ACCESS;  Surgeon: Cam Morene ORN, MD;  Location: [facility];  Service: Urology;  Laterality: Left;   PORTACATH PLACEMENT N/A 01/07/2024   Procedure: PORT PLACEMENT WITH ULTRASOUND GUIDANCE;  Surgeon: Debby Hila, MD;  Location: [facility];  Service: General;  Laterality: N/A;   SIGMOIDOSCOPY     TONSILLECTOMY  1945   adenoids removed   TOTAL HIP ARTHROPLASTY Left 06/03/2023   Procedure: TOTAL HIP ARTHROPLASTY ANTERIOR APPROACH;  Surgeon: Ernie Cough, MD;  Location: [facility];  Service: Orthopedics;  Laterality: Left;   TOTAL KNEE ARTHROPLASTY Right 06/06/2020   Procedure: TOTAL KNEE ARTHROPLASTY;  Surgeon: Ernie Cough, MD;  Location: [facility];  Service: Orthopedics;  Laterality: Right;  70 mins   TOTAL KNEE ARTHROPLASTY Left 07/17/2021   Procedure: TOTAL KNEE ARTHROPLASTY;  Surgeon: Ernie Cough, MD;  Location: [facility];  Service: Orthopedics;  Laterality: Left;   TUBAL LIGATION  1979    Social History [employer]  reports that she quit smoking about 57 years ago. Her smoking use included cigarettes. She started smoking about 67 years  ago. She has a 5 pack-year smoking history. She has never used smokeless tobacco. She reports that she does not currently use alcohol  after a past usage of about 1.0 standard drink of alcohol  per week. She reports that she does not use drugs.  family history includes Breast cancer in her mother; Heart disease in her father.  Allergies  Allergen Reactions   Statins Other (See Comments)    Muscle aches can take pravastatin    Sulfa Antibiotics Other (See Comments)    Blood in urine       PHYSICAL  EXAMINATION: Vital signs: BP 137/70   Pulse 60   Temp 97.6 F (36.4 C)   Resp 18   Ht 5' 5 (1.651 m)   Wt 160 lb (72.6 kg)   SpO2 94%   BMI 26.63 kg/m  General: Well-developed, well-nourished, no acute distress HEENT: Sclerae are anicteric, conjunctiva pink. Oral mucosa intact Lungs: Clear Heart: Regular Abdomen: soft, nontender, nondistended, no obvious ascites, no peritoneal signs, normal bowel sounds. No organomegaly. Extremities: No edema Psychiatric: alert and oriented x3. Cooperative     ASSESSMENT:  History of rectal cancer status post adjuvant therapy.  Imaging suggests possibly residual tumor.   PLAN: Flexible sigmoidoscopy with biopsies

## 2024-07-06 NOTE — Patient Instructions (Signed)
 Awaiting pathology results. Return to the care of your oncology team.  YOU HAD AN ENDOSCOPIC PROCEDURE TODAY AT THE [facility]:   Refer to the procedure report that was given to you for any specific questions about what was found during the examination.  If the procedure report does not answer your questions, please call your gastroenterologist to clarify.  If you requested that your care partner not be given the details of your procedure findings, then the procedure report has been included in a sealed envelope for you to review at your convenience later.  YOU SHOULD EXPECT: Some feelings of bloating in the abdomen. Passage of more gas than usual.  Walking can help get rid of the air that was put into your GI tract during the procedure and reduce the bloating. If you had a lower endoscopy (such as a colonoscopy or flexible sigmoidoscopy) you may notice spotting of blood in your stool or on the toilet paper. If you underwent a bowel prep for your procedure, you may not have a normal bowel movement for a few days.  Please Note:  You might notice some irritation and congestion in your nose or some drainage.  This is from the oxygen used during your procedure.  There is no need for concern and it should clear up in a day or so.  SYMPTOMS TO REPORT IMMEDIATELY:  Following lower endoscopy (colonoscopy or flexible sigmoidoscopy):  Excessive amounts of blood in the stool  Significant tenderness or worsening of abdominal pains  Swelling of the abdomen that is new, acute  Fever of 100F or higher  For urgent or emergent issues, a gastroenterologist can be reached at any hour by calling [phone]. Do not use MyChart messaging for urgent concerns.    DIET:  We do recommend a small meal at first, but then you may proceed to your regular diet.  Drink plenty of fluids but you should avoid alcoholic beverages for 24 hours.  ACTIVITY:  You should plan to take it easy for the rest of today  and you should NOT DRIVE or use heavy machinery until tomorrow (because of the sedation medicines used during the test).    FOLLOW UP: Our staff will call the number listed on your records the next business day following your procedure.  We will call around 7:15- 8:00 am to check on you and address any questions or concerns that you may have regarding the information given to you following your procedure. If we do not reach you, we will leave a message.     If any biopsies were taken you will be contacted by phone or by letter within the next 1-3 weeks.  Please call us  at [phone] if you have not heard about the biopsies in 3 weeks.    SIGNATURES/CONFIDENTIALITY: You and/or your care partner have signed paperwork which will be entered into your electronic medical record.  These signatures attest to the fact that that the information above on your After Visit Summary has been reviewed and is understood.  Full responsibility of the confidentiality of this discharge information lies with you and/or your care-partner.

## 2024-07-06 NOTE — Progress Notes (Signed)
 Called to room to assist during endoscopic procedure.  Patient ID and intended procedure confirmed with present staff. Received instructions for my participation in the procedure from the performing physician.

## 2024-07-07 ENCOUNTER — Ambulatory Visit: Payer: Self-pay | Admitting: Internal Medicine

## 2024-07-07 ENCOUNTER — Telehealth: Payer: Self-pay | Admitting: Hematology

## 2024-07-07 ENCOUNTER — Telehealth: Payer: Self-pay

## 2024-07-07 ENCOUNTER — Telehealth: Payer: Self-pay | Admitting: Lactation Services

## 2024-07-07 LAB — SURGICAL PATHOLOGY

## 2024-07-07 NOTE — Telephone Encounter (Signed)
  Follow up Call-     07/06/2024    3:19 PM 09/19/2023    2:44 PM  Call back number  Post procedure Call Back phone  # [phone]  Permission to leave phone message Yes Yes     Patient questions:  Do you have a fever, pain , or abdominal swelling? No. Pain Score  0 *  Have you tolerated food without any problems? Yes.    Have you been able to return to your normal activities? Yes.    Do you have any questions about your discharge instructions: Diet   No. Medications  No. Follow up visit  No.  Do you have questions or concerns about your Care? No.  Actions: * If pain score is 4 or above: No action needed, pain <4.

## 2024-07-07 NOTE — Telephone Encounter (Signed)
 Called and  spoke to patient  regarding her telephone appt. She is aware.

## 2024-07-07 NOTE — Telephone Encounter (Signed)
 LVM for pt stating that Dr. Lanny has consulted w/the pt's care team regarding the pt's treatment plan.  Stated that Dr. Lanny is recommending surgery and would like for the pt to contact her surgeon Dr. Bernarda Ned to consult for surgery.  Also, stated that Dr. Lanny would like a f/u with the pt w/in the next 1 to 2 weeks and that Dr. Demetra scheduler will be contacting the pt to get her scheduled.

## 2024-07-09 ENCOUNTER — Inpatient Hospital Stay: Attending: Radiation Oncology

## 2024-07-09 DIAGNOSIS — Z452 Encounter for adjustment and management of vascular access device: Secondary | ICD-10-CM | POA: Diagnosis not present

## 2024-07-09 DIAGNOSIS — C20 Malignant neoplasm of rectum: Secondary | ICD-10-CM | POA: Diagnosis not present

## 2024-07-09 DIAGNOSIS — Z95828 Presence of other vascular implants and grafts: Secondary | ICD-10-CM

## 2024-07-09 MED ORDER — SODIUM CHLORIDE 0.9% FLUSH
10.0000 mL | Freq: Once | INTRAVENOUS | Status: AC
  Administered 2024-07-09: 10 mL

## 2024-07-13 DIAGNOSIS — G629 Polyneuropathy, unspecified: Secondary | ICD-10-CM | POA: Diagnosis not present

## 2024-07-13 DIAGNOSIS — R7309 Other abnormal glucose: Secondary | ICD-10-CM | POA: Diagnosis not present

## 2024-07-13 DIAGNOSIS — I10 Essential (primary) hypertension: Secondary | ICD-10-CM | POA: Diagnosis not present

## 2024-07-13 DIAGNOSIS — R32 Unspecified urinary incontinence: Secondary | ICD-10-CM | POA: Diagnosis not present

## 2024-07-13 DIAGNOSIS — Z0000 Encounter for general adult medical examination without abnormal findings: Secondary | ICD-10-CM | POA: Diagnosis not present

## 2024-07-13 DIAGNOSIS — C801 Malignant (primary) neoplasm, unspecified: Secondary | ICD-10-CM | POA: Diagnosis not present

## 2024-07-13 DIAGNOSIS — D863 Sarcoidosis of skin: Secondary | ICD-10-CM | POA: Diagnosis not present

## 2024-07-13 DIAGNOSIS — E7800 Pure hypercholesterolemia, unspecified: Secondary | ICD-10-CM | POA: Diagnosis not present

## 2024-07-13 DIAGNOSIS — Z23 Encounter for immunization: Secondary | ICD-10-CM | POA: Diagnosis not present

## 2024-07-13 DIAGNOSIS — E559 Vitamin D deficiency, unspecified: Secondary | ICD-10-CM | POA: Diagnosis not present

## 2024-07-21 ENCOUNTER — Inpatient Hospital Stay: Attending: Radiation Oncology | Admitting: Hematology

## 2024-07-21 DIAGNOSIS — T451X5A Adverse effect of antineoplastic and immunosuppressive drugs, initial encounter: Secondary | ICD-10-CM | POA: Diagnosis not present

## 2024-07-21 DIAGNOSIS — G620 Drug-induced polyneuropathy: Secondary | ICD-10-CM | POA: Diagnosis not present

## 2024-07-21 DIAGNOSIS — C20 Malignant neoplasm of rectum: Secondary | ICD-10-CM | POA: Insufficient documentation

## 2024-07-21 DIAGNOSIS — Z9221 Personal history of antineoplastic chemotherapy: Secondary | ICD-10-CM | POA: Insufficient documentation

## 2024-07-21 DIAGNOSIS — G4700 Insomnia, unspecified: Secondary | ICD-10-CM | POA: Diagnosis not present

## 2024-07-21 NOTE — Progress Notes (Signed)
 [facility]   [phone] [phone]) [phone]   Clinic Follow up Note   Patient Care Team: Leonel Cole, MD as PCP - General (Family Medicine) Lanny Callander, MD as Consulting Physician (Hematology and Oncology) 07/21/2024  I connected with Samantha Samantha on 07/21/24 at  3:40 PM EDT by telephone and verified that I am speaking with the correct person using two identifiers.   I discussed the limitations, risks, security and privacy concerns of performing an evaluation and management service by telephone and the availability of in person appointments. I also discussed with the patient that there may be a patient responsible charge related to this service. The patient expressed understanding and agreed to proceed.   Patient's location:  Home  Provider's location:  Office    CHIEF COMPLAINT: f/u rectal cancer    CURRENT THERAPY: pending surgery vs systemic therapy   Oncology history Adenocarcinoma of rectum (HCC) -cT3bN1M0, stage IIIB, G2 -presented with rectal bleeding.  I reviewed her recent colonoscopy findings, biopsy results, and her staging CT images.  She has low rectal adenocarcinoma, CT is suspicious for nodal metastasis but no distant mets   -pelvic MRI 10/24/2023 showed T3N1 disease -pt was seen by surgeon Dr. Debby and rad/onc Dr. Dewey -she agrees with TNT, and started concurrent chemoRT on 11/03/2023 and completed on 12/17/2022 -she started chemo FOLFOX on 01/14/2024, plan for for 8 cycles , she completed on 04/20/2024  - Unfortunately flexible sigmoidoscopy on July 06, 2024 showed residual tumor in the rectum, and a biopsy confirmed. -Surgery is recommended, she will see Dr. Debby back.   Assessment & Plan Rectal cancer, partially responsive to chemotherapy Rectal cancer with partial response to chemotherapy. The cancer is smaller but not completely gone. The stage remains unchanged. She is open to considering surgery but has concerns about the cost and  physical toll. Molecular testing will determine eligibility for immunotherapy or targeted therapy as alternatives to surgery. - Order molecular testing for immunotherapy and targeted therapy eligibility - Encourage discussion with Dr. 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 Symptoms include persistent numbness in fingers and feet, with associated swelling in the left foot. Improvement is expected over time, though complete resolution is unlikely. Potential treatments include physical therapy, exercise, and vitamin B12 supplementation. Gabapentin or Cymbalta can be considered if tingling becomes painful, though currently it is an annoyance rather than pain. - Recommend physical therapy and exercise - Consider vitamin B12 supplementation - Consider gabapentin or Cymbalta if tingling becomes painful  Plan - I reviewed his recent flexible sigmoidoscopy and biopsy results, which confirmed residual rectal cancer - She plan to see Dr. Debby on September 15 to discuss surgery, she is reluctant to consider surgery. - Will obtain NGS [facility] to see if she is a candidate for immunotherapy or targeted therapy - Follow-up on October 3 as scheduled, to review her [facility] result   SUMMARY OF ONCOLOGIC HISTORY: Oncology History  Adenocarcinoma of rectum (HCC)  09/24/2023 Imaging   CT chest abdomen and pelvis with contrast  IMPRESSION: 1. Rectal primary with nodal metastasis in the mesorectum and sigmoid mesocolon. 2. No distant metastasis. 3. Chronic left-sided hydronephrosis with overlying cortical thinning and decreased/absent left renal function. Favor chronic ureteropelvic junction obstruction, given absence of hydroureter. 4. Degraded evaluation of the pelvis, secondary to beam hardening artifact from left hip  arthroplasty. 5. Incidental  findings, including: Coronary artery atherosclerosis. Aortic Atherosclerosis (ICD10-I70.0). Cholelithiasis. Bilateral nephrolithiasis.   10/02/2023 Initial Diagnosis   Adenocarcinoma of rectum (HCC)   10/24/2023 Cancer Staging   Staging form: Colon and Rectum, AJCC 8th Edition - Clinical stage from 10/24/2023: Stage IIIB (cT3, cN1, cM0) - Signed by Lanny Callander, MD on 10/31/2023 Histologic grade (G): G2 Histologic grading system: 4 grade system   10/29/2023 Imaging   Pelvic MRI FINDINGS: TUMOR LOCATION  Tumor distance from Anal Verge/Skin surface: 11.1 cm.  Tumor distance to Internal Anal sphincter: 7.4 cm.  TUMOR DESCRIPTION  Circumferential extent: 9 to 6 o'clock position, 75% of the total circumference, along the superior aspect.   Tumor Size and volume: 4.6 cm craniocaudal. Up to 1.9 cm in thickness.   T - CATEGORY   Extension through Muscularis Propria: Yes 1-43mm=T3b (approximately 1.6-1.8 mm.)   Shortest Distance of any tumor/node from Mesorectal fascia: 1.4 cm.   Extramural Vascular Invasion/Tumor Thrombus: No.   Invasion of Anterior Peritoneal Reflection: No.  Involvement of Adjacent Organs or Pelvic Sidewall: No.   Levator Ani Involvement: No.   N - CATEGORY   Mesorectal Lymph Nodes >=11mm: 1-3=N1   Extra-mesorectal Lymphadenopathy: No   Other: There are multiple sigmoid diverticula without imaging signs diverticulitis. Urinary bladder is decompressed. Surgically absent uterus. No large adnexal mass seen.   IMPRESSION: Rectal adenocarcinoma T stage: T3b   Rectal adenocarcinoma N stage: N1   Distance from tumor to the internal anal sphincter is 7.4 cm.       01/14/2024 -  Chemotherapy   Patient is on Treatment Plan : COLORECTAL FOLFOX q14d x 4 months     05/17/2024 Imaging   Pelvic MRI WO CM rectal cancer staging IMPRESSION: 1. Please note diffusion-weighted images are nondiagnostic due to susceptibility is artifact from the left hip  hardware. 2. There is focal irregular thickening along the right wall of the upper rectum as described above which may represent residual viable tumor. Correlation with sigmoidoscopy and tissue sampling is recommended. 3. In the upper rectum from 12 to 3 o'clock position there is mild focal thickening (up to 6 mm) which is predominantly T2 dark and favored to represent treated tumor. 4. There is a stable well-circumscribed round approximately 8 x 8 mm sized perirectal lymph node. No new pathologically enlarged lymph nodes.     Discussed the use of AI scribe software for clinical note transcription with the patient, who gave verbal consent to proceed.  History of Present Illness JISELL MAJER is an 83 year old female with rectal cancer who presents for follow-up.  She underwent a partial colonoscopy on August 19, which showed a reduction in tumor size but not complete resolution. She is frustrated by the prolonged focus on her health, impacting her ability to engage in family activities.  She experiences significant sleep disturbances, often only falling asleep at 7:30 in the morning. She feels 'like I'm standing on a powder keg.'  She has persistent numbness in her fingers and feet, initially triggered by cold exposure but now constant, with swelling in her feet, particularly the left one. The numbness is accompanied by significant tingling but no pain.  She experiences dizziness and impaired balance, with a fall approximately three to four weeks ago resulting in a skin injury on her left arm that has since healed.  She mentions hair loss, particularly at the back of her head, but notes some regrowth as she feels 'a little prickly thing' when touching her scalp.  REVIEW OF SYSTEMS:   Constitutional: Denies fevers, chills or abnormal weight loss Eyes: Denies blurriness of vision Ears, nose, mouth, throat, and face: Denies mucositis or sore throat Respiratory: Denies cough, dyspnea or  wheezes Cardiovascular: Denies palpitation, chest discomfort or lower extremity swelling Gastrointestinal:  Denies nausea, heartburn or change in bowel habits Skin: Denies abnormal skin rashes Lymphatics: Denies new lymphadenopathy or easy bruising Neurological:Denies numbness, tingling or new weaknesses Behavioral/Psych: Mood is stable, no new changes  All other systems were reviewed with the patient and are negative.  MEDICAL HISTORY:  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   Chronic kidney disease    Depression    Dysthymia    FHx: cardiovascular disease    History of blood transfusion as child   History of kidney stones    Hyperlipidemia    Hypertension    LBBB (left bundle branch block) 2014   on ekg   Non-functioning kidney    left kidney non functioning   Obesity    Pneumonia    hx of as a child   Vitamin D  deficiency     SURGICAL HISTORY: Past Surgical History:  Procedure Laterality Date   ABDOMINAL HYSTERECTOMY  1986   partial   APPENDECTOMY  1986   with hysterectomy   CATARACT EXTRACTION Bilateral 2016   COLONOSCOPY     CYSTOSCOPY/URETEROSCOPY/HOLMIUM LASER/STENT PLACEMENT Left 02/09/2021   Procedure: LEFT URETEROSCOPY/ LASER LITHOTRIPSY  AND  STENT PLACEMENT,ENDOPYLOTOMY;  Surgeon: Cam Morene ORN, MD;  Location: [facility];  Service: Urology;  Laterality: Left;   CYSTOSCOPY/URETEROSCOPY/HOLMIUM LASER/STENT PLACEMENT Left 03/15/2021   Procedure: CYSTOSCOPY, LEFT URETEROSCOPY, HOLMIUM LASER ENDOPYELOTOMY, URETERAL STENT PLACEMENT;  Surgeon: Cam Morene ORN, MD;  [facility];  Service: Urology;  Laterality: Left;   EYE SURGERY  2016   bil cataracts   HOLMIUM LASER APPLICATION Left 02/09/2021   Procedure: HOLMIUM LASER APPLICATION;  Surgeon: Cam Morene ORN, MD;  Location: [facility];  Service: Urology;  Laterality: Left;   KNEE ARTHROSCOPY  1995   left   NEPHROLITHOTOMY Left 03/23/2020   Procedure: LEFT NEPHROLITHOTOMY PERCUTANEOUS WITH SURGEON ACCESS;  Surgeon: Cam Morene ORN, MD;  Location[facility];  Service: Urology;  Laterality: Left;   PORTACATH PLACEMENT N/A 01/07/2024   Procedure: PORT PLACEMENT WITH ULTRASOUND GUIDANCE;  Surgeon: Debby Hila, MD;  Location: [facility];  Service: General;  Laterality: N/A;   SIGMOIDOSCOPY     TONSILLECTOMY  1945   adenoids removed   TOTAL HIP ARTHROPLASTY Left 06/03/2023   Procedure: TOTAL HIP ARTHROPLASTY ANTERIOR APPROACH;  Surgeon: Ernie Cough, MD;  Location: [facility];  Service: Orthopedics;  Laterality: Left;   TOTAL KNEE ARTHROPLASTY Right 06/06/2020   Procedure: TOTAL KNEE ARTHROPLASTY;  Surgeon: Ernie Cough, MD;  Location: [facility];  Service: Orthopedics;  Laterality: Right;  70 mins   TOTAL KNEE ARTHROPLASTY Left 07/17/2021   Procedure: TOTAL KNEE ARTHROPLASTY;  Surgeon: Ernie Cough, MD;  Location: [facility];  Service: Orthopedics;  Laterality: Left;   TUBAL LIGATION  1979    I have reviewed the social history and family history with the patient and they are unchanged from previous note.  ALLERGIES:  is allergic to statins and sulfa antibiotics.  MEDICATIONS:  Current Outpatient Medications  Medication Sig Dispense Refill  albuterol  (PROVENTIL  HFA;VENTOLIN  HFA) 108 (90 BASE) MCG/ACT inhaler Inhale 2 puffs into the lungs every 6 (six) hours as needed for wheezing or shortness of breath. 1 Inhaler 0   bacitracin-polymyxin b (POLYSPORIN) ointment Apply 1 application topically daily as needed (wound care).     bisoprolol -hydrochlorothiazide  (ZIAC ) 5-6.25 MG tablet TAKE 1 TABLET BY MOUTH DAILY. 90 tablet 0   diclofenac Sodium (VOLTAREN) 1 % GEL Apply 1 Application topically 2 (two) times daily as needed (pain.).     Emollient (UDDERLY SMOOTH EX) Apply 1 Application topically as needed (dry/irritated skin).      lidocaine -prilocaine  (EMLA ) cream Apply to affected area once 30 g 3   pravastatin  (PRAVACHOL ) 40 MG tablet TAKE 1 TABLET EVERY DAY 90 tablet 0   Propylene Glycol, PF, (SYSTANE COMPLETE PF) 0.6 % SOLN Place 1 drop into both eyes daily.     sertraline  (ZOLOFT ) 100 MG tablet TAKE 1 TABLET EVERY DAY 90 tablet 0   traZODone  (DESYREL ) 50 MG tablet Take 1 tablet (50 mg total) by mouth at bedtime as needed for sleep. 90 tablet 1   No current facility-administered medications for this visit.    PHYSICAL EXAMINATION: Not performed   LABORATORY DATA:  I have reviewed the data as listed    Latest Ref Rng & Units 05/20/2024   11:26 AM 04/20/2024    9:57 AM 04/06/2024   10:16 AM  CBC  WBC 4.0 - 10.5 K/uL 4.1  2.6  2.5   Hemoglobin 12.0 - 15.0 g/dL 88.4  89.2  89.2   Hematocrit 36.0 - 46.0 % 34.5  31.4  31.5   Platelets 150 - 400 K/uL 117  81  76         Latest Ref Rng & Units 05/20/2024   11:26 AM 04/20/2024    9:57 AM 04/06/2024   10:16 AM  CMP  Glucose 70 - 99 mg/dL 898  897  895   BUN 8 - 23 mg/dL 7  8  12    Creatinine 0.44 - 1.00 mg/dL 9.16  9.14  9.08   Sodium 135 - 145 mmol/L 141  143  141   Potassium 3.5 - 5.1 mmol/L 4.0  3.9  3.6   Chloride 98 - 111 mmol/L 108  109  108   CO2 22 - 32 mmol/L 27  28  28    Calcium  8.9 - 10.3 mg/dL 9.5  9.2  9.2   Total Protein 6.5 - 8.1 g/dL 6.7  6.6  6.4   Total Bilirubin 0.0 - 1.2 mg/dL 0.7  0.7  0.6   Alkaline Phos 38 - 126 U/L 117  98  104   AST 15 - 41 U/L 25  24  23    ALT 0 - 44 U/L 9  9  9        RADIOGRAPHIC STUDIES: I have personally reviewed the radiological images as listed and agreed with the findings in the report. No results found.     I discussed the assessment and treatment plan with the patient. The patient was provided an opportunity to ask questions and all were answered. The patient agreed with the plan and demonstrated an understanding of the instructions.   The patient was advised to call back or seek an in-person  evaluation if the symptoms worsen or if the condition fails to improve as anticipated.  I provided 25 minutes of non face-to-face telephone visit time during this encounter, including review of chart and various tests results, discussions about plan of  care and coordination of care plan.    Onita Mattock, MD 07/21/24

## 2024-07-21 NOTE — Assessment & Plan Note (Signed)
-  [id number], stage IIIB, G2 -presented with rectal bleeding.  I reviewed her recent colonoscopy findings, biopsy results, and her staging CT images.  She has low rectal adenocarcinoma, CT is suspicious for nodal metastasis but no distant mets   -pelvic MRI 10/24/2023 showed T3N1 disease -pt was seen by surgeon Dr. Debby and rad/onc Dr. Dewey -she agrees with TNT, and started concurrent chemoRT on 11/03/2023 and completed on 12/17/2022 -she started chemo FOLFOX on 01/14/2024, plan for for 8 cycles , she completed on 04/20/2024  - Unfortunately flexible sigmoidoscopy on July 06, 2024 showed residual tumor in the rectum, and a biopsy confirmed. -Surgery is recommended, she will see Dr. Debby back.

## 2024-07-22 ENCOUNTER — Other Ambulatory Visit: Payer: Self-pay

## 2024-08-02 ENCOUNTER — Other Ambulatory Visit: Payer: Self-pay

## 2024-08-02 DIAGNOSIS — C20 Malignant neoplasm of rectum: Secondary | ICD-10-CM | POA: Diagnosis not present

## 2024-08-02 NOTE — Progress Notes (Signed)
 Verbal order w/readback order from Dr. Lanny for [facility] to be drawn.  Order placed in EPIC and in [facility] portal.  [facility] kt and requisition given to [facility] Lab Receptionist.

## 2024-08-04 ENCOUNTER — Encounter (HOSPITAL_COMMUNITY): Payer: Self-pay | Admitting: General Surgery

## 2024-08-04 NOTE — Progress Notes (Signed)
 For Anesthesia: PCP - Cheryle Frees, MD  Cardiologist - Mona Vinie BROCKS., MD  Oncology-Dr. Ileana  Bowel Prep reminder: N/A  Chest x-ray - 01/07/24 in [facility] EKG - greater than 1 year Stress Test - greater than 2 years ECHO - N/A Cardiac Cath - N/A Pacemaker/ICD device last checked: N/A Pacemaker orders received:N/A Device Rep notified: N/A  Spinal Cord Stimulator:N/A  Sleep Study - N/A CPAP -   Fasting Blood Sugar - N/A Checks Blood Sugar __N/A___ times a day Date and result of last Hgb A1c-N/A  Last dose of GLP1 agonist- N/A GLP1 instructions: Hold 7 days prior to schedule (Hold 24 hours-daily)   Last dose of SGLT-2 inhibitors-N/A  SGLT-2 instructions: Hold 72 hours prior to surgery  Blood Thinner Instructions:N/A Last Dose: N/A Time last taken: N/A  Aspirin  Instructions: N/A Last Dose: N/A Time last taken: N/A  Activity level: activities of daily living without stopping and without chest pain and/or shortness of breath      Anesthesia review: LBBB, HTN, CKD  Patient denies shortness of breath, fever, cough and chest pain at PAT appointment   Patient verbalized understanding of instructions that were reviewed over the telephone.

## 2024-08-05 ENCOUNTER — Encounter (HOSPITAL_COMMUNITY): Payer: Self-pay | Admitting: General Surgery

## 2024-08-05 ENCOUNTER — Other Ambulatory Visit: Payer: Self-pay

## 2024-08-05 NOTE — Progress Notes (Signed)
 Attempted to obtain medical history via telephone, unable to reach at this time. HIPAA compliant voicemail message left requesting return call to pre surgical testing department.

## 2024-08-06 ENCOUNTER — Inpatient Hospital Stay

## 2024-08-06 ENCOUNTER — Ambulatory Visit: Payer: Self-pay | Admitting: General Surgery

## 2024-08-06 ENCOUNTER — Encounter: Payer: Self-pay | Admitting: Hematology

## 2024-08-06 DIAGNOSIS — C20 Malignant neoplasm of rectum: Secondary | ICD-10-CM | POA: Diagnosis not present

## 2024-08-06 LAB — CBC WITH DIFFERENTIAL (CANCER CENTER ONLY)
Abs Immature Granulocytes: 0.01 K/uL (ref 0.00–0.07)
Basophils Absolute: 0 K/uL (ref 0.0–0.1)
Basophils Relative: 0 %
Eosinophils Absolute: 0.1 K/uL (ref 0.0–0.5)
Eosinophils Relative: 3 %
HCT: 35 % — ABNORMAL LOW (ref 36.0–46.0)
Hemoglobin: 11.9 g/dL — ABNORMAL LOW (ref 12.0–15.0)
Immature Granulocytes: 0 %
Lymphocytes Relative: 13 %
Lymphs Abs: 0.5 K/uL — ABNORMAL LOW (ref 0.7–4.0)
MCH: 33.7 pg (ref 26.0–34.0)
MCHC: 34 g/dL (ref 30.0–36.0)
MCV: 99.2 fL (ref 80.0–100.0)
Monocytes Absolute: 0.4 K/uL (ref 0.1–1.0)
Monocytes Relative: 11 %
Neutro Abs: 2.8 K/uL (ref 1.7–7.7)
Neutrophils Relative %: 73 %
Platelet Count: 121 K/uL — ABNORMAL LOW (ref 150–400)
RBC: 3.53 MIL/uL — ABNORMAL LOW (ref 3.87–5.11)
RDW: 13.1 % (ref 11.5–15.5)
WBC Count: 3.9 K/uL — ABNORMAL LOW (ref 4.0–10.5)
nRBC: 0 % (ref 0.0–0.2)

## 2024-08-06 LAB — CMP (CANCER CENTER ONLY)
ALT: 11 U/L (ref 0–44)
AST: 23 U/L (ref 15–41)
Albumin: 3.9 g/dL (ref 3.5–5.0)
Alkaline Phosphatase: 118 U/L (ref 38–126)
Anion gap: 4 — ABNORMAL LOW (ref 5–15)
BUN: 13 mg/dL (ref 8–23)
CO2: 29 mmol/L (ref 22–32)
Calcium: 9.5 mg/dL (ref 8.9–10.3)
Chloride: 109 mmol/L (ref 98–111)
Creatinine: 0.87 mg/dL (ref 0.44–1.00)
GFR, Estimated: >60 mL/min (ref >60)
Glucose, Bld: 88 mg/dL (ref 70–99)
Potassium: 3.6 mmol/L (ref 3.5–5.1)
Sodium: 142 mmol/L (ref 135–145)
Total Bilirubin: 0.7 mg/dL (ref 0.0–1.2)
Total Protein: 6.8 g/dL (ref 6.5–8.1)

## 2024-08-06 LAB — MISCELLANEOUS TEST

## 2024-08-06 NOTE — H&P (Signed)
 PROVIDER:  BERNARDA WANDA NED, MD   MRN: [id number] DOB: February 09, 1941    Subjective    Chief Complaint: Recheck ( - Rectal cancer, follow up after Tx and discuss surgery/)       History of Present Illness: Samantha Jenkins is a 83 y.o. female who is seen today as an office consultation at the request of Dr. NED for evaluation of Recheck ( - Rectal cancer, follow up after Tx and discuss surgery/) .  Patient presented to [facility], Dr. Abran for recurrent rectal bleeding.  Colonoscopy was performed.  An ulcerated nonobstructing mass was found in the distal rectum.  It was located approximately 2 to 3 cm from the anal verge.  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  CT scans of the chest abdomen and pelvis show the rectal primary with nodal metastasis in the mesorectum and sigmoid mesocolon.  Surgical history significant for abdominal hysterectomy and appendectomy in 1986.   Initial pelvic MRI showed a stage of T3N1 with 7cm to the sphincter complex.  She underwent total neoadjuvant treatment.   ChemoRT was completed 12/18/23 and this was followed with FOLFOX.  Follow-up MRI shows some irregular thickening along the right wall of the rectum and a large 8 x 8 mm perirectal lymph node.  Flexible sigmoidoscopy shows a smaller but residual ulcerative mass in the distal rectum.    Review of Systems: A complete review of systems was obtained from the patient.  I have reviewed this information and discussed as appropriate with the patient.  See HPI as well for other ROS.     Medical History:     Past Medical History:  Diagnosis Date   Anxiety     Asthma, unspecified asthma severity, unspecified whether complicated, unspecified whether persistent (HHS-HCC)     Chronic kidney disease     History of cancer     Hypertension        There is no problem list on file for this patient.          Past Surgical History:  Procedure Laterality Date   JOINT REPLACEMENT                Allergies  Allergen Reactions   Statins-Hmg-Coa Reductase Inhibitors Unknown   Sulfa (Sulfonamide Antibiotics) Other (See Comments)      Blood in urine            Current Outpatient Medications on File Prior to Visit  Medication Sig Dispense Refill   bisoproloL -hydroCHLOROthiazide  (ZIAC ) 5-6.25 mg tablet Take 1 tablet by mouth once daily       pravastatin  (PRAVACHOL ) 40 MG tablet Take 40 mg by mouth once daily       sertraline  (ZOLOFT ) 100 MG tablet TAKE 1 AND 1/2 TABLETS ONCE DAILY       traZODone  (DESYREL ) 50 MG tablet TAKE ONE TABLET BY MOUTH ONCE a DAY at bedtime AS NEEDED        No current facility-administered medications on file prior to visit.           Family History  Problem Relation Age of Onset   Breast cancer Mother     High blood pressure (Hypertension) Father     Hyperlipidemia (Elevated cholesterol) Father     Coronary Artery Disease (Blocked arteries around heart) Father        Social History        Tobacco Use  Smoking Status Former   Types: Cigarettes  Smokeless Tobacco  Not on file      Social History         Socioeconomic History   Marital status: Divorced  Tobacco Use   Smoking status: Former      Types: Cigarettes  Vaping Use   Vaping status: Unknown  Substance and Sexual Activity   Alcohol  use: Yes   Drug use: Not Currently    Social Drivers of Health        Food Insecurity: No Food Insecurity (10/21/2023)    Received from [facility]    Hunger Vital Sign     Within the past 12 months, you worried that your food would run out before you got the money to buy more.: Never true     Within the past 12 months, the food you bought just didn't last and you didn't have money to get more.: Never true  Transportation Needs: No Transportation Needs (10/21/2023)    Received from [facility]     Lack of Transportation (Medical): No     Lack of Transportation (Non-Medical): No  Housing Stability: Unknown  (08/02/2024)    Housing Stability Vital Sign     Homeless in the Last Year: No      Objective:          Vitals:    08/02/24 0931 08/02/24 0932  Pulse: 58    Resp: 16    Temp: 36.6 C (97.9 F)    SpO2: 98%    Weight: 73.5 kg (162 lb)    Height: 157.5 cm (5' 2)    PainSc:   0-No pain      Exam Gen: NAD Abd: soft Rectal: anterior mass ~4cm from sphincters, minimal fibrosis noted in the distal rectum.     Labs, Imaging and Diagnostic Testing: CT chest abdomen and pelvis images and report reviewed.  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 Colonoscopy report and images reviewed.   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  She is relatively healthy.  Pelvic MRI showed an initial stage of T3N1.  She underwent total neoadjuvant treatment.   ChemoRT was completed 12/18/23.  Follow-up MRI shows some irregular thickening along the right wall of the rectum and a large 8 x 8 mm perirectal lymph node.  Flexible sigmoidoscopy shows a smaller but residual ulcerative mass in the distal rectum.  Biopsies confirm moderately differentiated adenocarcinoma.  This is consistent with a partial response to treatment.  She was referred here today to further discuss surgical treatment, which would include LAR with permanent colostomy.  We have discussed this in detail today. The surgery and anatomy were described to the patient as well as the risks of surgery and the possible complications.  These include: Bleeding, deep abdominal infections and possible wound complications such as hernia and infection, damage to adjacent structures, leak of surgical connections, which can lead to other surgeries and possibly an ostomy, possible need for other procedures, such as abscess drains in radiology, possible prolonged hospital stay,  possible diarrhea from removal of part of the colon, possible constipation from narcotics, possible bowel, bladder or sexual dysfunction if having rectal surgery, prolonged fatigue/weakness or appetite loss, possible early recurrence of of disease, possible complications of their medical problems such as heart disease or arrhythmias or lung problems,  death (less than 1%). I believe the patient understands and wishes to proceed with the surgery.     Bernarda JAYSON Ned, MD Colon and Rectal Surgery [facility]

## 2024-08-06 NOTE — H&P (View-Only) (Signed)
 PROVIDER:  BERNARDA WANDA NED, MD   MRN: [id number] DOB: February 09, 1941    Subjective    Chief Complaint: Recheck ( - Rectal cancer, follow up after Tx and discuss surgery/)       History of Present Illness: Samantha Jenkins is a 83 y.o. female who is seen today as an office consultation at the request of Dr. NED for evaluation of Recheck ( - Rectal cancer, follow up after Tx and discuss surgery/) .  Patient presented to [facility], Dr. Abran for recurrent rectal bleeding.  Colonoscopy was performed.  An ulcerated nonobstructing mass was found in the distal rectum.  It was located approximately 2 to 3 cm from the anal verge.  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  CT scans of the chest abdomen and pelvis show the rectal primary with nodal metastasis in the mesorectum and sigmoid mesocolon.  Surgical history significant for abdominal hysterectomy and appendectomy in 1986.   Initial pelvic MRI showed a stage of T3N1 with 7cm to the sphincter complex.  She underwent total neoadjuvant treatment.   ChemoRT was completed 12/18/23 and this was followed with FOLFOX.  Follow-up MRI shows some irregular thickening along the right wall of the rectum and a large 8 x 8 mm perirectal lymph node.  Flexible sigmoidoscopy shows a smaller but residual ulcerative mass in the distal rectum.    Review of Systems: A complete review of systems was obtained from the patient.  I have reviewed this information and discussed as appropriate with the patient.  See HPI as well for other ROS.     Medical History:     Past Medical History:  Diagnosis Date   Anxiety     Asthma, unspecified asthma severity, unspecified whether complicated, unspecified whether persistent (HHS-HCC)     Chronic kidney disease     History of cancer     Hypertension        There is no problem list on file for this patient.          Past Surgical History:  Procedure Laterality Date   JOINT REPLACEMENT                Allergies  Allergen Reactions   Statins-Hmg-Coa Reductase Inhibitors Unknown   Sulfa (Sulfonamide Antibiotics) Other (See Comments)      Blood in urine            Current Outpatient Medications on File Prior to Visit  Medication Sig Dispense Refill   bisoproloL -hydroCHLOROthiazide  (ZIAC ) 5-6.25 mg tablet Take 1 tablet by mouth once daily       pravastatin  (PRAVACHOL ) 40 MG tablet Take 40 mg by mouth once daily       sertraline  (ZOLOFT ) 100 MG tablet TAKE 1 AND 1/2 TABLETS ONCE DAILY       traZODone  (DESYREL ) 50 MG tablet TAKE ONE TABLET BY MOUTH ONCE a DAY at bedtime AS NEEDED        No current facility-administered medications on file prior to visit.           Family History  Problem Relation Age of Onset   Breast cancer Mother     High blood pressure (Hypertension) Father     Hyperlipidemia (Elevated cholesterol) Father     Coronary Artery Disease (Blocked arteries around heart) Father        Social History        Tobacco Use  Smoking Status Former   Types: Cigarettes  Smokeless Tobacco  Not on file      Social History         Socioeconomic History   Marital status: Divorced  Tobacco Use   Smoking status: Former      Types: Cigarettes  Vaping Use   Vaping status: Unknown  Substance and Sexual Activity   Alcohol  use: Yes   Drug use: Not Currently    Social Drivers of Health        Food Insecurity: No Food Insecurity (10/21/2023)    Received from [facility]    Hunger Vital Sign     Within the past 12 months, you worried that your food would run out before you got the money to buy more.: Never true     Within the past 12 months, the food you bought just didn't last and you didn't have money to get more.: Never true  Transportation Needs: No Transportation Needs (10/21/2023)    Received from [facility] - [facility]     Lack of Transportation (Medical): No     Lack of Transportation (Non-Medical): No  Housing Stability: Unknown  (08/02/2024)    Housing Stability Vital Sign     Homeless in the Last Year: No      Objective:          Vitals:    08/02/24 0931 08/02/24 0932  Pulse: 58    Resp: 16    Temp: 36.6 C (97.9 F)    SpO2: 98%    Weight: 73.5 kg (162 lb)    Height: 157.5 cm (5' 2)    PainSc:   0-No pain      Exam Gen: NAD Abd: soft Rectal: anterior mass ~4cm from sphincters, minimal fibrosis noted in the distal rectum.     Labs, Imaging and Diagnostic Testing: CT chest abdomen and pelvis images and report reviewed.  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 Colonoscopy report and images reviewed.   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  She is relatively healthy.  Pelvic MRI showed an initial stage of T3N1.  She underwent total neoadjuvant treatment.   ChemoRT was completed 12/18/23.  Follow-up MRI shows some irregular thickening along the right wall of the rectum and a large 8 x 8 mm perirectal lymph node.  Flexible sigmoidoscopy shows a smaller but residual ulcerative mass in the distal rectum.  Biopsies confirm moderately differentiated adenocarcinoma.  This is consistent with a partial response to treatment.  She was referred here today to further discuss surgical treatment, which would include LAR with permanent colostomy.  We have discussed this in detail today. The surgery and anatomy were described to the patient as well as the risks of surgery and the possible complications.  These include: Bleeding, deep abdominal infections and possible wound complications such as hernia and infection, damage to adjacent structures, leak of surgical connections, which can lead to other surgeries and possibly an ostomy, possible need for other procedures, such as abscess drains in radiology, possible prolonged hospital stay,  possible diarrhea from removal of part of the colon, possible constipation from narcotics, possible bowel, bladder or sexual dysfunction if having rectal surgery, prolonged fatigue/weakness or appetite loss, possible early recurrence of of disease, possible complications of their medical problems such as heart disease or arrhythmias or lung problems,  death (less than 1%). I believe the patient understands and wishes to proceed with the surgery.     Bernarda JAYSON Ned, MD Colon and Rectal Surgery [facility]

## 2024-08-11 ENCOUNTER — Other Ambulatory Visit: Payer: Self-pay

## 2024-08-11 ENCOUNTER — Inpatient Hospital Stay (HOSPITAL_COMMUNITY): Payer: Self-pay | Admitting: Physician Assistant

## 2024-08-11 ENCOUNTER — Encounter (HOSPITAL_COMMUNITY): Payer: Self-pay | Admitting: General Surgery

## 2024-08-11 ENCOUNTER — Inpatient Hospital Stay (HOSPITAL_COMMUNITY)
Admission: RE | Admit: 2024-08-11 | Discharge: 2024-08-16 | DRG: 330 | Disposition: A | Discharge status: Skilled Nursing Facility | Attending: General Surgery | Admitting: General Surgery

## 2024-08-11 ENCOUNTER — Encounter (HOSPITAL_COMMUNITY)
Admission: RE | Disposition: A | Discharge status: Skilled Nursing Facility | Payer: Self-pay | Source: Home / Self Care | Attending: General Surgery

## 2024-08-11 DIAGNOSIS — D490 Neoplasm of unspecified behavior of digestive system: Secondary | ICD-10-CM | POA: Diagnosis not present

## 2024-08-11 DIAGNOSIS — C772 Secondary and unspecified malignant neoplasm of intra-abdominal lymph nodes: Secondary | ICD-10-CM | POA: Diagnosis not present

## 2024-08-11 DIAGNOSIS — Z79899 Other long term (current) drug therapy: Secondary | ICD-10-CM | POA: Diagnosis not present

## 2024-08-11 DIAGNOSIS — Z923 Personal history of irradiation: Secondary | ICD-10-CM | POA: Diagnosis not present

## 2024-08-11 DIAGNOSIS — Z8249 Family history of ischemic heart disease and other diseases of the circulatory system: Secondary | ICD-10-CM | POA: Diagnosis not present

## 2024-08-11 DIAGNOSIS — Z87891 Personal history of nicotine dependence: Secondary | ICD-10-CM

## 2024-08-11 DIAGNOSIS — G609 Hereditary and idiopathic neuropathy, unspecified: Secondary | ICD-10-CM | POA: Diagnosis not present

## 2024-08-11 DIAGNOSIS — Z96653 Presence of artificial knee joint, bilateral: Secondary | ICD-10-CM | POA: Diagnosis not present

## 2024-08-11 DIAGNOSIS — M6281 Muscle weakness (generalized): Secondary | ICD-10-CM | POA: Diagnosis not present

## 2024-08-11 DIAGNOSIS — J45909 Unspecified asthma, uncomplicated: Secondary | ICD-10-CM | POA: Diagnosis not present

## 2024-08-11 DIAGNOSIS — H538 Other visual disturbances: Secondary | ICD-10-CM | POA: Diagnosis not present

## 2024-08-11 DIAGNOSIS — K5730 Diverticulosis of large intestine without perforation or abscess without bleeding: Secondary | ICD-10-CM | POA: Diagnosis not present

## 2024-08-11 DIAGNOSIS — C20 Malignant neoplasm of rectum: Secondary | ICD-10-CM | POA: Diagnosis not present

## 2024-08-11 DIAGNOSIS — Z90710 Acquired absence of both cervix and uterus: Secondary | ICD-10-CM

## 2024-08-11 DIAGNOSIS — Z803 Family history of malignant neoplasm of breast: Secondary | ICD-10-CM

## 2024-08-11 DIAGNOSIS — Z01818 Encounter for other preprocedural examination: Secondary | ICD-10-CM

## 2024-08-11 DIAGNOSIS — Z888 Allergy status to other drugs, medicaments and biological substances status: Secondary | ICD-10-CM

## 2024-08-11 DIAGNOSIS — I129 Hypertensive chronic kidney disease with stage 1 through stage 4 chronic kidney disease, or unspecified chronic kidney disease: Secondary | ICD-10-CM | POA: Diagnosis not present

## 2024-08-11 DIAGNOSIS — Z882 Allergy status to sulfonamides status: Secondary | ICD-10-CM

## 2024-08-11 DIAGNOSIS — I10 Essential (primary) hypertension: Principal | ICD-10-CM

## 2024-08-11 DIAGNOSIS — Z933 Colostomy status: Secondary | ICD-10-CM | POA: Diagnosis not present

## 2024-08-11 DIAGNOSIS — R2689 Other abnormalities of gait and mobility: Secondary | ICD-10-CM | POA: Diagnosis not present

## 2024-08-11 DIAGNOSIS — Z96642 Presence of left artificial hip joint: Secondary | ICD-10-CM | POA: Diagnosis not present

## 2024-08-11 HISTORY — DX: Polyneuropathy, unspecified: G62.9

## 2024-08-11 HISTORY — PX: COLOSTOMY: SHX63

## 2024-08-11 HISTORY — PX: XI ROBOTIC ASSISTED LOWER ANTERIOR RESECTION: SHX6558

## 2024-08-11 HISTORY — DX: Sarcoidosis, unspecified: D86.9

## 2024-08-11 HISTORY — DX: Malignant neoplasm of rectum: C20

## 2024-08-11 HISTORY — DX: Malignant (primary) neoplasm, unspecified: C80.1

## 2024-08-11 LAB — CBC
HCT: 37.1 % (ref 36.0–46.0)
Hemoglobin: 12.2 g/dL (ref 12.0–15.0)
MCH: 33.6 pg (ref 26.0–34.0)
MCHC: 32.9 g/dL (ref 30.0–36.0)
MCV: 102.2 fL — ABNORMAL HIGH (ref 80.0–100.0)
Platelets: 137 K/uL — ABNORMAL LOW (ref 150–400)
RBC: 3.63 MIL/uL — ABNORMAL LOW (ref 3.87–5.11)
RDW: 13.1 % (ref 11.5–15.5)
WBC: 3.4 K/uL — ABNORMAL LOW (ref 4.0–10.5)
nRBC: 0 % (ref 0.0–0.2)

## 2024-08-11 LAB — TYPE AND SCREEN
ABO/RH(D): O POS
Antibody Screen: NEGATIVE

## 2024-08-11 LAB — BASIC METABOLIC PANEL WITH GFR
Anion gap: 12 (ref 5–15)
BUN: 10 mg/dL (ref 8–23)
CO2: 22 mmol/L (ref 22–32)
Calcium: 9.6 mg/dL (ref 8.9–10.3)
Chloride: 108 mmol/L (ref 98–111)
Creatinine, Ser: 0.98 mg/dL (ref 0.44–1.00)
GFR, Estimated: 57 mL/min — ABNORMAL LOW (ref >60)
Glucose, Bld: 96 mg/dL (ref 70–99)
Potassium: 3.5 mmol/L (ref 3.5–5.1)
Sodium: 143 mmol/L (ref 135–145)

## 2024-08-11 SURGERY — RESECTION, RECTUM, LOW ANTERIOR, ROBOT-ASSISTED
Anesthesia: General | Site: Abdomen

## 2024-08-11 MED ORDER — ALBUTEROL SULFATE (2.5 MG/3ML) 0.083% IN NEBU: 2.5000 mg | INHALATION_SOLUTION | Freq: Four times a day (QID) | RESPIRATORY_TRACT | Status: DC | PRN

## 2024-08-11 MED ORDER — GABAPENTIN 300 MG PO CAPS
300.0000 mg | ORAL_CAPSULE | ORAL | Status: AC
  Administered 2024-08-11: 300 mg via ORAL
  Filled 2024-08-11: qty 1

## 2024-08-11 MED ORDER — LACTATED RINGERS IV SOLN: INTRAVENOUS | Status: DC

## 2024-08-11 MED ORDER — BISOPROLOL-HYDROCHLOROTHIAZIDE 5-6.25 MG PO TABS
1.0000 | ORAL_TABLET | Freq: Every day | ORAL | Status: DC
Start: 2024-08-11 — End: 2024-08-11

## 2024-08-11 MED ORDER — ACETAMINOPHEN 500 MG PO TABS
1000.0000 mg | ORAL_TABLET | Freq: Four times a day (QID) | ORAL | Status: DC
Start: 2024-08-12 — End: 2024-08-16
  Administered 2024-08-12 – 2024-08-16 (×16): 1000 mg via ORAL
  Filled 2024-08-11 (×17): qty 2

## 2024-08-11 MED ORDER — LABETALOL HCL 5 MG/ML IV SOLN
INTRAVENOUS | Status: AC
Start: 2024-08-11 — End: 2024-08-11
  Filled 2024-08-11: qty 4

## 2024-08-11 MED ORDER — LABETALOL HCL 5 MG/ML IV SOLN
10.0000 mg | Freq: Once | INTRAVENOUS | Status: AC
  Administered 2024-08-11: 10 mg via INTRAVENOUS

## 2024-08-11 MED ORDER — PROPYLENE GLYCOL (PF) 0.6 % OP SOLN: 1.0000 [drp] | Freq: Every day | OPHTHALMIC | Status: DC

## 2024-08-11 MED ORDER — BISOPROLOL FUMARATE 5 MG PO TABS: 5.0000 mg | ORAL_TABLET | Freq: Every day | ORAL | Status: DC

## 2024-08-11 MED ORDER — ACETAMINOPHEN 500 MG PO TABS: 1000.0000 mg | ORAL_TABLET | Freq: Once | ORAL | Status: AC

## 2024-08-11 MED ORDER — FENTANYL CITRATE (PF) 250 MCG/5ML IJ SOLN
INTRAMUSCULAR | Status: DC | PRN
  Administered 2024-08-11: 100 ug via INTRAVENOUS
  Administered 2024-08-11: 50 ug via INTRAVENOUS
  Administered 2024-08-11: 100 ug via INTRAVENOUS

## 2024-08-11 MED ORDER — LIDOCAINE HCL (PF) 2 % IJ SOLN
INTRAMUSCULAR | Status: AC
  Filled 2024-08-11: qty 5

## 2024-08-11 MED ORDER — SODIUM CHLORIDE 0.9 % IR SOLN
Status: DC | PRN
  Administered 2024-08-11 (×2): 1000 mL

## 2024-08-11 MED ORDER — BISACODYL 5 MG PO TBEC: 20.0000 mg | DELAYED_RELEASE_TABLET | Freq: Once | ORAL | Status: DC

## 2024-08-11 MED ORDER — ALBUTEROL SULFATE HFA 108 (90 BASE) MCG/ACT IN AERS: 2.0000 | INHALATION_SPRAY | Freq: Four times a day (QID) | RESPIRATORY_TRACT | Status: DC | PRN

## 2024-08-11 MED ORDER — DEXAMETHASONE SODIUM PHOSPHATE 10 MG/ML IJ SOLN
INTRAMUSCULAR | Status: DC | PRN
  Administered 2024-08-11 (×2): 10 mg via INTRAVENOUS

## 2024-08-11 MED ORDER — CEFOTETAN DISODIUM 2 G IJ SOLR
2.0000 g | INTRAMUSCULAR | Status: AC
  Administered 2024-08-11: 2 g via INTRAVENOUS
  Filled 2024-08-11: qty 2

## 2024-08-11 MED ORDER — ALUM & MAG HYDROXIDE-SIMETH 200-200-20 MG/5ML PO SUSP: 30.0000 mL | Freq: Four times a day (QID) | ORAL | Status: DC | PRN

## 2024-08-11 MED ORDER — SERTRALINE HCL 50 MG PO TABS
150.0000 mg | ORAL_TABLET | Freq: Every day | ORAL | Status: DC
  Administered 2024-08-12 – 2024-08-15 (×4): 150 mg via ORAL
  Filled 2024-08-11 (×5): qty 1

## 2024-08-11 MED ORDER — ALVIMOPAN 12 MG PO CAPS
12.0000 mg | ORAL_CAPSULE | ORAL | Status: AC
  Administered 2024-08-11: 12 mg via ORAL
  Filled 2024-08-11: qty 1

## 2024-08-11 MED ORDER — SACCHAROMYCES BOULARDII 250 MG PO CAPS
250.0000 mg | ORAL_CAPSULE | Freq: Two times a day (BID) | ORAL | Status: DC
Start: 2024-08-12 — End: 2024-08-16
  Administered 2024-08-12 – 2024-08-16 (×9): 250 mg via ORAL
  Filled 2024-08-11 (×9): qty 1

## 2024-08-11 MED ORDER — ENOXAPARIN SODIUM 40 MG/0.4ML IJ SOSY
40.0000 mg | PREFILLED_SYRINGE | INTRAMUSCULAR | Status: DC
Start: 2024-08-12 — End: 2024-08-16
  Administered 2024-08-12 – 2024-08-16 (×5): 40 mg via SUBCUTANEOUS
  Filled 2024-08-11 (×5): qty 0.4

## 2024-08-11 MED ORDER — LIDOCAINE 2% (20 MG/ML) 5 ML SYRINGE
INTRAMUSCULAR | Status: DC | PRN
  Administered 2024-08-11: 80 mg via INTRAVENOUS

## 2024-08-11 MED ORDER — ROCURONIUM BROMIDE 10 MG/ML (PF) SYRINGE
PREFILLED_SYRINGE | INTRAVENOUS | Status: AC
  Filled 2024-08-11: qty 10

## 2024-08-11 MED ORDER — BUPIVACAINE-EPINEPHRINE 0.25% -1:200000 IJ SOLN
INTRAMUSCULAR | Status: DC | PRN
  Administered 2024-08-11: 50 mL

## 2024-08-11 MED ORDER — SODIUM CHLORIDE 0.9 % IV SOLN
2.0000 g | Freq: Two times a day (BID) | INTRAVENOUS | Status: AC
  Administered 2024-08-11: 2 g via INTRAVENOUS
  Filled 2024-08-11: qty 2

## 2024-08-11 MED ORDER — TRAZODONE HCL 50 MG PO TABS: 50.0000 mg | ORAL_TABLET | Freq: Every evening | ORAL | Status: DC | PRN

## 2024-08-11 MED ORDER — SODIUM CHLORIDE (PF) 0.9 % IJ SOLN
INTRAMUSCULAR | Status: AC
  Filled 2024-08-11: qty 40

## 2024-08-11 MED ORDER — ROCURONIUM BROMIDE 10 MG/ML (PF) SYRINGE
PREFILLED_SYRINGE | INTRAVENOUS | Status: AC
Start: 2024-08-11 — End: 2024-08-11
  Filled 2024-08-11: qty 10

## 2024-08-11 MED ORDER — ORAL CARE MOUTH RINSE: 15.0000 mL | Freq: Once | OROMUCOSAL | Status: AC

## 2024-08-11 MED ORDER — ONDANSETRON HCL 4 MG PO TABS: 4.0000 mg | ORAL_TABLET | Freq: Four times a day (QID) | ORAL | Status: DC | PRN

## 2024-08-11 MED ORDER — TRAMADOL HCL 50 MG PO TABS
50.0000 mg | ORAL_TABLET | Freq: Four times a day (QID) | ORAL | Status: DC | PRN
  Administered 2024-08-12 (×2): 50 mg via ORAL
  Filled 2024-08-11 (×2): qty 1

## 2024-08-11 MED ORDER — POLYVINYL ALCOHOL 1.4 % OP SOLN
1.0000 [drp] | Freq: Every day | OPHTHALMIC | Status: DC
  Administered 2024-08-13 – 2024-08-16 (×4): 1 [drp] via OPHTHALMIC
  Filled 2024-08-11: qty 15

## 2024-08-11 MED ORDER — GABAPENTIN 100 MG PO CAPS
300.0000 mg | ORAL_CAPSULE | Freq: Two times a day (BID) | ORAL | Status: DC
  Administered 2024-08-12 – 2024-08-16 (×10): 300 mg via ORAL
  Filled 2024-08-11 (×10): qty 3

## 2024-08-11 MED ORDER — PROPOFOL 10 MG/ML IV BOLUS
INTRAVENOUS | Status: DC | PRN
Start: 2024-08-11 — End: 2024-08-11
  Administered 2024-08-11: 100 mg via INTRAVENOUS

## 2024-08-11 MED ORDER — ROCURONIUM 10MG/ML (10ML) SYRINGE FOR MEDFUSION PUMP - OPTIME
INTRAVENOUS | Status: DC | PRN
  Administered 2024-08-11 (×2): 50 mg via INTRAVENOUS

## 2024-08-11 MED ORDER — ENSURE PRE-SURGERY PO LIQD: 592.0000 mL | Freq: Once | ORAL | Status: DC

## 2024-08-11 MED ORDER — DIPHENHYDRAMINE HCL 50 MG/ML IJ SOLN: 12.5000 mg | Freq: Four times a day (QID) | INTRAMUSCULAR | Status: DC | PRN

## 2024-08-11 MED ORDER — PROPOFOL 10 MG/ML IV BOLUS
INTRAVENOUS | Status: AC
  Filled 2024-08-11: qty 20

## 2024-08-11 MED ORDER — SIMETHICONE 80 MG PO CHEW: 40.0000 mg | CHEWABLE_TABLET | Freq: Four times a day (QID) | ORAL | Status: DC | PRN

## 2024-08-11 MED ORDER — DIPHENHYDRAMINE HCL 12.5 MG/5ML PO ELIX: 12.5000 mg | ORAL_SOLUTION | Freq: Four times a day (QID) | ORAL | Status: DC | PRN

## 2024-08-11 MED ORDER — DEXMEDETOMIDINE HCL IN NACL 80 MCG/20ML IV SOLN
INTRAVENOUS | Status: DC | PRN
  Administered 2024-08-11: 10 ug via INTRAVENOUS

## 2024-08-11 MED ORDER — ENSURE SURGERY PO LIQD
237.0000 mL | Freq: Two times a day (BID) | ORAL | Status: DC
  Administered 2024-08-13 – 2024-08-14 (×3): 237 mL via ORAL

## 2024-08-11 MED ORDER — ONDANSETRON HCL 4 MG/2ML IJ SOLN: 4.0000 mg | Freq: Once | INTRAMUSCULAR | Status: DC | PRN

## 2024-08-11 MED ORDER — HYDROMORPHONE HCL 1 MG/ML IJ SOLN
0.5000 mg | INTRAMUSCULAR | Status: DC | PRN
  Administered 2024-08-12: 0.5 mg via INTRAVENOUS
  Filled 2024-08-11: qty 0.5

## 2024-08-11 MED ORDER — CHLORHEXIDINE GLUCONATE 0.12 % MT SOLN
15.0000 mL | Freq: Once | OROMUCOSAL | Status: AC
  Administered 2024-08-11: 15 mL via OROMUCOSAL

## 2024-08-11 MED ORDER — HEPARIN SODIUM (PORCINE) 5000 UNIT/ML IJ SOLN
5000.0000 [IU] | Freq: Once | INTRAMUSCULAR | Status: AC
  Administered 2024-08-11: 5000 [IU] via SUBCUTANEOUS
  Filled 2024-08-11: qty 1

## 2024-08-11 MED ORDER — ENSURE PRE-SURGERY PO LIQD: 296.0000 mL | Freq: Once | ORAL | Status: DC

## 2024-08-11 MED ORDER — HYDROCHLOROTHIAZIDE 10 MG/ML ORAL SUSPENSION
6.2500 mg | Freq: Every day | ORAL | Status: DC
  Filled 2024-08-11: qty 1.25

## 2024-08-11 MED ORDER — FENTANYL CITRATE PF 50 MCG/ML IJ SOSY: 25.0000 ug | PREFILLED_SYRINGE | INTRAMUSCULAR | Status: DC | PRN

## 2024-08-11 MED ORDER — KCL IN DEXTROSE-NACL 20-5-0.45 MEQ/L-%-% IV SOLN
INTRAVENOUS | Status: DC
  Filled 2024-08-11: qty 1000

## 2024-08-11 MED ORDER — SODIUM CHLORIDE (PF) 0.9 % IJ SOLN
INTRAMUSCULAR | Status: AC
Start: 2024-08-11 — End: 2024-08-11
  Filled 2024-08-11: qty 20

## 2024-08-11 MED ORDER — HYDROMORPHONE HCL 2 MG/ML IJ SOLN
INTRAMUSCULAR | Status: AC
  Filled 2024-08-11: qty 1

## 2024-08-11 MED ORDER — EPHEDRINE SULFATE (PRESSORS) 50 MG/ML IJ SOLN
INTRAMUSCULAR | Status: DC | PRN
  Administered 2024-08-11: 10 mg via INTRAVENOUS
  Administered 2024-08-11: 5 mg via INTRAVENOUS

## 2024-08-11 MED ORDER — BUPIVACAINE-EPINEPHRINE (PF) 0.25% -1:200000 IJ SOLN
INTRAMUSCULAR | Status: AC
Start: 2024-08-11 — End: 2024-08-11
  Filled 2024-08-11: qty 60

## 2024-08-11 MED ORDER — ALVIMOPAN 12 MG PO CAPS
12.0000 mg | ORAL_CAPSULE | Freq: Two times a day (BID) | ORAL | Status: DC
  Administered 2024-08-12 – 2024-08-13 (×3): 12 mg via ORAL
  Filled 2024-08-11 (×4): qty 1

## 2024-08-11 MED ORDER — ACETAMINOPHEN 500 MG PO TABS
1000.0000 mg | ORAL_TABLET | ORAL | Status: AC
  Administered 2024-08-11: 1000 mg via ORAL
  Filled 2024-08-11: qty 2

## 2024-08-11 MED ORDER — ONDANSETRON HCL 4 MG/2ML IJ SOLN
4.0000 mg | Freq: Four times a day (QID) | INTRAMUSCULAR | Status: DC | PRN
  Administered 2024-08-12: 4 mg via INTRAVENOUS
  Filled 2024-08-11: qty 2

## 2024-08-11 MED ORDER — ONDANSETRON HCL 4 MG/2ML IJ SOLN
INTRAMUSCULAR | Status: DC | PRN
  Administered 2024-08-11: 4 mg via INTRAVENOUS

## 2024-08-11 MED ORDER — HYDROMORPHONE HCL 1 MG/ML IJ SOLN
INTRAMUSCULAR | Status: DC | PRN
  Administered 2024-08-11 (×2): 1 mg via INTRAVENOUS

## 2024-08-11 MED ORDER — PHENYLEPHRINE HCL-NACL 20-0.9 MG/250ML-% IV SOLN
INTRAVENOUS | Status: DC | PRN
  Administered 2024-08-11: 50 ug/min via INTRAVENOUS

## 2024-08-11 MED ORDER — FENTANYL CITRATE (PF) 250 MCG/5ML IJ SOLN
INTRAMUSCULAR | Status: AC
  Filled 2024-08-11: qty 5

## 2024-08-11 MED ORDER — PRAVASTATIN SODIUM 20 MG PO TABS
40.0000 mg | ORAL_TABLET | Freq: Every evening | ORAL | Status: DC
  Administered 2024-08-12 – 2024-08-15 (×4): 40 mg via ORAL
  Filled 2024-08-11 (×4): qty 2

## 2024-08-11 MED ORDER — SUGAMMADEX SODIUM 200 MG/2ML IV SOLN
INTRAVENOUS | Status: DC | PRN
  Administered 2024-08-11: 200 mg via INTRAVENOUS

## 2024-08-11 MED ORDER — POLYETHYLENE GLYCOL 3350 17 GM/SCOOP PO POWD
238.0000 g | Freq: Once | ORAL | Status: DC
Start: 2024-08-11 — End: 2024-08-11

## 2024-08-11 SURGICAL SUPPLY — 76 items
BAG COUNTER SPONGE SURGICOUNT (BAG) ×2 IMPLANT
BLADE EXTENDED COATED 6.5IN (ELECTRODE) IMPLANT
CANNULA REDUCER 12-8 DVNC XI (CANNULA) IMPLANT
CELLS DAT CNTRL 66122 CELL SVR (MISCELLANEOUS) IMPLANT
COVER SURGICAL LIGHT HANDLE (MISCELLANEOUS) ×4 IMPLANT
COVER TIP SHEARS 8 DVNC (MISCELLANEOUS) ×2 IMPLANT
DEFOGGER SCOPE WARM SEASHARP (MISCELLANEOUS) ×2 IMPLANT
DERMABOND ADVANCED .7 DNX12 (GAUZE/BANDAGES/DRESSINGS) IMPLANT
DRAIN CHANNEL 19F RND (DRAIN) IMPLANT
DRAPE ARM DVNC X/XI (DISPOSABLE) ×8 IMPLANT
DRAPE COLUMN DVNC XI (DISPOSABLE) ×2 IMPLANT
DRAPE CV SPLIT W-CLR ANES SCRN (DRAPES) ×2 IMPLANT
DRAPE PERI GROIN 82X75IN TIB (DRAPES) ×2 IMPLANT
DRAPE SURG IRRIG POUCH 19X23 (DRAPES) ×2 IMPLANT
DRIVER NDL LRG 8 DVNC XI (INSTRUMENTS) ×2 IMPLANT
DRIVER NDL MEGA SUTCUT DVNCXI (INSTRUMENTS) IMPLANT
DRIVER NDLE LRG 8 DVNC XI (INSTRUMENTS) ×2 IMPLANT
DRIVER NDLE MEGA SUTCUT DVNCXI (INSTRUMENTS) ×2 IMPLANT
DRSG OPSITE POSTOP 4X6 (GAUZE/BANDAGES/DRESSINGS) IMPLANT
DRSG OPSITE POSTOP 4X8 (GAUZE/BANDAGES/DRESSINGS) IMPLANT
DRSG TEGADERM 4X4.75 (GAUZE/BANDAGES/DRESSINGS) IMPLANT
ELECT PENCIL ROCKER SW 15FT (MISCELLANEOUS) ×2 IMPLANT
ELECT REM PT RETURN 15FT ADLT (MISCELLANEOUS) ×2 IMPLANT
EVACUATOR SILICONE 100CC (DRAIN) IMPLANT
GLOVE BIO SURGEON STRL SZ 6.5 (GLOVE) ×6 IMPLANT
GLOVE INDICATOR 6.5 STRL GRN (GLOVE) ×6 IMPLANT
GOWN SRG XL LVL 4 BRTHBL STRL (GOWNS) ×2 IMPLANT
GOWN STRL REUS W/ TWL XL LVL3 (GOWN DISPOSABLE) ×6 IMPLANT
GRASPER SUT TROCAR 14GX15 (MISCELLANEOUS) IMPLANT
GRASPER TIP-UP FEN DVNC XI (INSTRUMENTS) ×2 IMPLANT
HOLDER FOLEY CATH W/STRAP (MISCELLANEOUS) ×2 IMPLANT
IRRIGATION SUCT STRKRFLW 2 WTP (MISCELLANEOUS) ×2 IMPLANT
KIT PROCEDURE DVNC SI (MISCELLANEOUS) IMPLANT
KIT TURNOVER KIT A (KITS) ×2 IMPLANT
MARKER SKIN DUAL TIP RULER LAB (MISCELLANEOUS) IMPLANT
NDL INSUFFLATION 14GA 120MM (NEEDLE) ×2 IMPLANT
NEEDLE INSUFFLATION 14GA 120MM (NEEDLE) ×2 IMPLANT
PACK COLON (CUSTOM PROCEDURE TRAY) ×2 IMPLANT
PAD POSITIONING PINK XL (MISCELLANEOUS) ×2 IMPLANT
RELOAD STAPLE 60 3.5 BLU DVNC (STAPLE) IMPLANT
RELOAD STAPLE 60 4.3 GRN DVNC (STAPLE) IMPLANT
RETRACTOR WND ALEXIS 18 MED (MISCELLANEOUS) IMPLANT
SCISSORS LAP 5X35 DISP (ENDOMECHANICALS) IMPLANT
SCISSORS MNPLR CVD DVNC XI (INSTRUMENTS) ×2 IMPLANT
SEAL UNIV 5-12 XI (MISCELLANEOUS) ×6 IMPLANT
SEALER VESSEL EXT DVNC XI (MISCELLANEOUS) ×2 IMPLANT
SOL PREP POV-IOD 4OZ 10% (MISCELLANEOUS) IMPLANT
SOLUTION ELECTROSURG ANTI STCK (MISCELLANEOUS) ×2 IMPLANT
SPIKE FLUID TRANSFER (MISCELLANEOUS) IMPLANT
SPONGE DRAIN TRACH 4X4 STRL 2S (GAUZE/BANDAGES/DRESSINGS) IMPLANT
STAPLER 60 SUREFORM DVNC (STAPLE) IMPLANT
STAPLER ECHELON POWER CIR 29 (STAPLE) IMPLANT
STAPLER ECHELON POWER CIR 31 (STAPLE) IMPLANT
SUT ETHIBOND 2 0 SH 36X2 (SUTURE) IMPLANT
SUT ETHILON 2 0 PS N (SUTURE) IMPLANT
SUT NOV 1 T60/GS (SUTURE) IMPLANT
SUT NOVA NAB GS-21 1 T12 (SUTURE) ×4 IMPLANT
SUT PROLENE 2 0 KS (SUTURE) IMPLANT
SUT SILK 2 0 SH CR/8 (SUTURE) IMPLANT
SUT SILK 2-0 18XBRD TIE 12 (SUTURE) ×2 IMPLANT
SUT SILK 3 0 SH CR/8 (SUTURE) ×2 IMPLANT
SUT SILK 3-0 18XBRD TIE 12 (SUTURE) IMPLANT
SUT VIC AB 2-0 SH 18 (SUTURE) IMPLANT
SUT VIC AB 2-0 SH 27X BRD (SUTURE) IMPLANT
SUT VIC AB 3-0 SH 18 (SUTURE) IMPLANT
SUT VIC AB 4-0 PS2 27 (SUTURE) ×4 IMPLANT
SUT VICRYL 0 UR6 27IN ABS (SUTURE) ×2 IMPLANT
SUTURE V-LC BRB 180 2/0GR6GS22 (SUTURE) IMPLANT
SYR 20ML ECCENTRIC (SYRINGE) ×2 IMPLANT
SYSTEM WOUND ALEXIS 18CM MED (MISCELLANEOUS) IMPLANT
TOWEL OR 17X26 10 PK STRL BLUE (TOWEL DISPOSABLE) IMPLANT
TRAY FOLEY MTR SLVR 14FR STAT (SET/KITS/TRAYS/PACK) IMPLANT
TRAY FOLEY MTR SLVR 16FR STAT (SET/KITS/TRAYS/PACK) ×2 IMPLANT
TROCAR ADV FIXATION 5X100MM (TROCAR) ×2 IMPLANT
TUBING CONNECTING 10 (TUBING) ×4 IMPLANT
TUBING INSUFFLATION 10FT LAP (TUBING) ×2 IMPLANT

## 2024-08-11 NOTE — Anesthesia Postprocedure Evaluation (Signed)
 Anesthesia Post Note  Patient: Jenkins SHAUNNA Passy  Procedure(s) Performed: RESECTION, RECTUM, LOW ANTERIOR, ROBOT-ASSISTED (Abdomen) CREATION, END COLOSTOMY (Left: Abdomen)     Patient location during evaluation: PACU Anesthesia Type: General Level of consciousness: awake and alert Pain management: pain level controlled Vital Signs Assessment: post-procedure vital signs reviewed and stable Respiratory status: spontaneous breathing, nonlabored ventilation, respiratory function stable and patient connected to nasal cannula oxygen Cardiovascular status: blood pressure returned to baseline and stable Postop Assessment: no apparent nausea or vomiting Anesthetic complications: no   No notable events documented.  Last Vitals:  Vitals:   08/11/24 1745 08/11/24 1800  BP: (!) 185/81 (!) 168/65  Pulse: 70 60  Resp: 17 11  Temp:    SpO2: 100% 96%    Last Pain:  Vitals:   08/11/24 1800  TempSrc:   PainSc: Asleep                 Garnette FORBES Skillern

## 2024-08-11 NOTE — Consult Note (Signed)
 WOC Nurse requested for preoperative stoma site marking  Discussed surgical procedure and stoma creation with patient in presurgical holding.  She plans to go to surgery soon. Explained role of the WOC nurse team. Answered patient's questions.   Examined patient lying and sitting forward on the stretcher,  in order to place the marking in the patient's visual field, away from any creases or abdominal contour issues and within the rectus muscle. Attempted to mark below the patient's belt line. There is a significant crease located lower on the abd which should be avoided if possible.   Marked for colostomy in the LLQ  _5___ cm to the left of the umbilicus and _2___cm below the umbilicus.  Marked for ileostomy in the RLQ  __5__cm to the right of the umbilicus and  _2___ cm below the umbilicus.  WOC Nurse team will follow up with patient after surgery for continued ostomy care and teaching if she receives an ostomy.   Thank-you,  Stephane Fought MSN, RN, CWOCN, CWCN-AP, CNS Contact Mon-Fri 0700-1500: [phone]

## 2024-08-11 NOTE — Significant Event (Signed)
 Called to bedside at 2045 for concerns of hypothermia post-op colon resection and new colostomy. Advised bedside RN to inform provider on call. In meantime, warm blankets were applied. Normothermia at time of my assessment at 2058. (See flowsheets). Bedside RN will continue to monitor. No new orders sought at this time.    08/11/24 2058  Charting Type  Charting Type  (rapid response assessment)  Neurological  Level of Consciousness Alert  Orientation Level Oriented X4  Glasgow Coma Scale  Eye Opening 3  Best Verbal Response (NON-intubated) 5  Best Motor Response 6  Glasgow Coma Scale Score 14  Richmond Agitation Sedation Scale  Richmond Agitation Sedation Scale (RASS) -1  RASS Goal 0  Respiratory  Respiratory Pattern Regular;Unlabored;Symmetrical  Bilateral Breath Sounds Clear  Cardiac  Heart Sounds S1, S2  Gastrointestinal  Abdomen Inspection Soft;Surgical scar  Bowel Sounds Assessment Active  Tenderness Tender  Colostomy LUQ  Placement Date/Time: 08/11/24 1645   Person Inserting LDA: Dr. Bernarda Ned  Location: LUQ  Ostomy Pouch 2 piece  Stoma Assessment Red  Peristomal Assessment Intact  Treatment Other (Comment) (assessment)  Output (mL)  (minimal sanguinous drainage seen in colostomy bag)  NG/OG Vented/Dual Lumen Oral  Placement Date/Time: 08/11/24 1440   Tube Location: Oral  Secured by: Tape  Initial Placement Verification: Confirmed by Surgical Manipulation  Tube Position (Required)  (not in place)  Status Other (Comment) (not present)  Psychosocial  Psychosocial (WDL) WDL  Wound 08/11/24 1450 Surgical Laparoscopic Abdomen 1: Right;Lower 2: Right;Mid 3: Right;Upper 4: Left;Medial;Upper  Date First Assessed/Time First Assessed: 08/11/24 1450   Present on Original Admission: No  Primary Wound Type: Surgical  Secondary Wound Type - Surgical: Laparoscopic  Location: Abdomen  Number of Laparoscopic Ports: 4  Port: 1:  Location Orientation...  Port 1 Dressing Status  Clean, Dry, Intact  Port 2 Dressing Status Clean, Dry, Intact  Port 3 Dressing Status Clean, Dry, Intact  Port 4 Dressing Status Clean, Dry, Intact  Closed System Drain 1 Right RUQ Bulb (JP) 19 Fr.  Placement Date/Time: 08/11/24 1642   Person Inserting LDA: Dr. Bernarda Ned  Tube Number: 1  Orientation: Right  Location: RUQ  Drain Tube Type: Bulb (JP)  Size (Fr.): 19 Fr.  Dressing Status Clean, Dry, Intact

## 2024-08-11 NOTE — Progress Notes (Signed)
 rm 1306 Samantha Jenkins, colostomy created. Came from PACU. Her temp has been 94.1. diff getting it up. Nurse place multiple blankets and increased temperature in the room.   RR notified. But right now 97.5. oral.   FYI

## 2024-08-11 NOTE — Progress Notes (Signed)
 WOC called she will be down in 5-10 minutes to mark this patient before surgery

## 2024-08-11 NOTE — Interval H&P Note (Signed)
 History and Physical Interval Note:  08/11/2024 12:08 PM  Samantha Samantha  has presented today for surgery, with the diagnosis of RECTAL CANCER.  The various methods of treatment have been discussed with the patient and family. After consideration of risks, benefits and other options for treatment, the patient has consented to  Procedure(s): RESECTION, RECTUM, LOW ANTERIOR, ROBOT-ASSISTED (N/A) as a surgical intervention.  The patient's history has been reviewed, patient examined, no change in status, stable for surgery.  I have reviewed the patient's chart and labs.  Questions were answered to the patient's satisfaction.     Bernarda JAYSON Ned, MD  Colorectal and General Surgery [facility]

## 2024-08-11 NOTE — Anesthesia Procedure Notes (Signed)
 Procedure Name: Intubation Date/Time: 08/11/2024 1:49 PM  Performed by: Nanci Riis, CRNAPre-anesthesia Checklist: Patient identified, Emergency Drugs available, Suction available, Patient being monitored and Timeout performed Patient Re-evaluated:Patient Re-evaluated prior to induction Oxygen Delivery Method: Circle system utilized Preoxygenation: Pre-oxygenation with 100% oxygen Induction Type: IV induction Ventilation: Mask ventilation without difficulty Laryngoscope Size: Miller and 3 Grade View: Grade II Tube type: Oral Tube size: 7.0 mm Number of attempts: 1 Airway Equipment and Method: Stylet Placement Confirmation: ETT inserted through vocal cords under direct vision, positive ETCO2 and breath sounds checked- equal and bilateral Secured at: 21 cm Tube secured with: Tape Dental Injury: Teeth and Oropharynx as per pre-operative assessment

## 2024-08-11 NOTE — Transfer of Care (Signed)
 Immediate Anesthesia Transfer of Care Note  Patient: Samantha Samantha  Procedure(s) Performed: RESECTION, RECTUM, LOW ANTERIOR, ROBOT-ASSISTED (Abdomen) CREATION, END COLOSTOMY (Left: Abdomen)  Patient Location: PACU  Anesthesia Type:General  Level of Consciousness: drowsy and patient cooperative  Airway & Oxygen Therapy: Patient Spontanous Breathing and Patient connected to face mask oxygen  Post-op Assessment: Report given to RN and Post -op Vital signs reviewed and stable  Post vital signs: Reviewed and stable  Last Vitals:  Vitals Value Taken Time  BP 184/78 08/11/24 17:18  Temp    Pulse 77 08/11/24 17:20  Resp 8 08/11/24 17:20  SpO2 99 % 08/11/24 17:20  Vitals shown include unfiled device data.  Last Pain:  Vitals:   08/11/24 1112  TempSrc:   PainSc: 0-No pain      Patients Stated Pain Goal: 5 (08/11/24 1039)  Complications: No notable events documented.

## 2024-08-11 NOTE — Op Note (Signed)
 08/11/2024  4:52 PM  PATIENT:  Samantha Samantha  83 y.o. female  Patient Care Team: Leonel Cole, MD as PCP - General (Family Medicine) Lanny Callander, MD as Consulting Physician (Hematology and Oncology)  PRE-OPERATIVE DIAGNOSIS:  DISTAL RECTAL CANCER  POST-OPERATIVE DIAGNOSIS:  DISTAL RECTAL CANCER  PROCEDURE:  ROBOT-ASSISTED LOW ANTERIOR RESECTION, END COLOSTOMY   Surgeon(s): Sheldon Standing, MD Debby Hila, MD  ASSISTANT: Dr Sheldon   ANESTHESIA:   local and general  EBL: Total I/O In: 1000 [I.V.:1000] Out: 300 [Urine:300]  Delay start of Pharmacological VTE agent (>24hrs) due to surgical blood loss or risk of bleeding:  no  DRAINS: (64F) Jackson-Pratt drain(s) with closed bulb suction in the pelvis   SPECIMEN:  Source of Specimen:  Rectosigmoid colon  DISPOSITION OF SPECIMEN:  PATHOLOGY  COUNTS:  YES  PLAN OF CARE: Admit to inpatient   PATIENT DISPOSITION:  PACU - hemodynamically stable.  INDICATION:    83 y.o. F s/p TNT for rectal cancer with incomplete response.  I recommended low anterior resection with colostomy:  The anatomy & physiology of the digestive tract was discussed.  The pathophysiology was discussed.  Natural history risks without surgery was discussed.   I worked to give an overview of the disease and the frequent need to have multispecialty involvement.  I feel the risks of no intervention will lead to serious problems that outweigh the operative risks; therefore, I recommended a partial colectomy to remove the pathology.  Laparoscopic & open techniques were discussed.   Risks such as bleeding, infection, abscess, leak, reoperation, possible ostomy, hernia, heart attack, death, and other risks were discussed.  I noted a good likelihood this will help address the problem.   Goals of post-operative recovery were discussed as well.    The patient expressed understanding & wished to proceed with surgery.  OR FINDINGS:   Patient had tumor noted ~2-3 cm  from the sphincters in the R lateral rectal wall  No obvious metastatic disease on visceral parietal peritoneum or liver.   DESCRIPTION:   Informed consent was confirmed.  The patient underwent general anaesthesia without difficulty.  The patient was positioned appropriately.  VTE prevention in place.  The patient's abdomen was clipped, prepped, & draped in a sterile fashion.  Surgical timeout confirmed our plan.  The patient was positioned in reverse Trendelenburg.  Abdominal entry was gained using a Varies needle in the LUQ.  Entry was clean.  I induced carbon dioxide insufflation.  An 8mm robotic port was placed in the RUQ.  Camera inspection revealed no injury.  Extra ports were carefully placed under direct laparoscopic visualization.  I laparoscopically reflected the greater omentum and the upper abdomen the small bowel in the upper abdomen. The patient was appropriately positioned and the robot was docked to the patient's left side.  Instruments were placed under direct visualization.    I mobilized the sigmoid colon off of the pelvic sidewall.  I scored the base of peritoneum of the right side of the mesentery of the left colon from the ligament of Treitz to the peritoneal reflection of the mid rectum.  The patient had quite a bit of friable tissue from her previous radiation.  I elevated the sigmoid mesentery and enetered into the retro-mesenteric plane. We were able to identify the left ureter and gonadal vessels. We kept those posterior within the retroperitoneum and elevated the left colon mesentery off that. I did isolated IMA pedicle but did not ligate it yet.  I  continued distally and got into the avascular plane posterior to the mesorectum. This allowed me to help mobilize the rectum as well by freeing the mesorectum off the sacrum.  I mobilized the peritoneal coverings towards the peritoneal reflection on both the right and left sides of the rectum.  I could see the right and left ureters  and stayed away from them.   I skeletonized the inferior mesenteric artery pedicle.  I went down to its takeoff from the aorta.  I After confirming the left ureter was out of the way, I went ahead and ligated the inferior mesenteric artery pedicle with bipolar robotic vessel sealer ~2cm above its takeoff from the aorta. We ensured hemostasis.  I continued my dissection posteriorly down to the pelvic floor around the mesorectum.  I continued this out laterally on both sides using blunt dissection and the robotic vessel sealer.  I divided the peritoneal reflection on both sides until they met at the anterior midline.  I bluntly mobilized the rectum off of the posterior vaginal wall.  I divided the remaining anterior lateral attachments.  I continued my dissection into the anal canal to allow for mobilization out of the pelvis.  I then confirmed my dissection was completely below the tumor.  2 loads of a 60 mm green robotic stapler were used to transect the rectum distally.  Hemostasis was good.  The staple line was oversewn using a 2-0 Ethibond suture.  The abdomen was irrigated.  Hemostasis was good.  A 19 [place] Blake drain was placed in the pelvis and brought out through the right lower quadrant port site and secured into position with a 2-0 nylon suture.  I then marched my left lower quadrant 12 mm port site into an ostomy aperture.  An Alexis wound protector was placed and the colon was brought out of this.  The colon was transected in the mid sigmoid over a Kocher clamp.  There was good perfusion to the remaining colon.  The rectosigmoid was sent to pathology for further examination.  The Alexis wound protector was removed.  The fascia was closed partially using 2 interrupted 0 Novafil sutures.  The colon was then matured in standard Brooke fashion using interrupted 2-0 Vicryl sutures.  An ostomy appliance was placed.  The remaining port sites were closed using interrupted 4-0 Vicryl suture and Dermabond.   The patient was then awakened from anesthesia and sent to the postanesthesia care unit in stable condition.  All counts were correct per operating room staff.   An MD assistant was necessary for tissue manipulation, retraction and positioning due to the complexity of the case and hospital policies  Bernarda JAYSON Ned, MD  Colorectal and General Surgery [facility] Surgery

## 2024-08-11 NOTE — Anesthesia Preprocedure Evaluation (Addendum)
 Anesthesia Evaluation  Patient identified by MRN, date of birth, ID band Patient awake    Reviewed: Allergy & Precautions, NPO status , Patient's Chart, lab work & pertinent test results, reviewed documented beta blocker date and time   Airway Mallampati: II  TM Distance: >3 FB Neck ROM: Full    Dental  (+) Dental Advisory Given, Missing   Pulmonary asthma , former smoker   Pulmonary exam normal breath sounds clear to auscultation       Cardiovascular hypertension, Pt. on medications (-) angina (-) Past MI and (-) Cardiac Stents Normal cardiovascular exam+ dysrhythmias (LBBB)  Rhythm:Regular Rate:Normal     Neuro/Psych  PSYCHIATRIC DISORDERS Anxiety Depression     Neuromuscular disease    GI/Hepatic Neg liver ROS,,, Rectal cancer    Endo/Other  negative endocrine ROS    Renal/GU Renal InsufficiencyRenal disease     Musculoskeletal  (+) Arthritis ,    Abdominal   Peds  Hematology  (+) Blood dyscrasia (Plt 121k), anemia   Anesthesia Other Findings Day of surgery medications reviewed with the patient.  Reproductive/Obstetrics                              Anesthesia Physical Anesthesia Plan  ASA: 3  Anesthesia Plan: General   Post-op Pain Management: Tylenol  PO (pre-op)*   Induction: Intravenous  PONV Risk Score and Plan: 4 or greater and Ondansetron  and Dexamethasone   Airway Management Planned: Oral ETT  Additional Equipment: ClearSight  Intra-op Plan:   Post-operative Plan: Extubation in OR  Informed Consent: I have reviewed the patients History and Physical, chart, labs and discussed the procedure including the risks, benefits and alternatives for the proposed anesthesia with the patient or authorized representative who has indicated his/her understanding and acceptance.     Dental advisory given  Plan Discussed with: CRNA  Anesthesia Plan Comments: (2nd PIV after  induction)         Anesthesia Quick Evaluation

## 2024-08-12 ENCOUNTER — Encounter (HOSPITAL_COMMUNITY): Payer: Self-pay | Admitting: General Surgery

## 2024-08-12 LAB — BASIC METABOLIC PANEL WITH GFR
Anion gap: 11 (ref 5–15)
BUN: 9 mg/dL (ref 8–23)
CO2: 22 mmol/L (ref 22–32)
Calcium: 8.9 mg/dL (ref 8.9–10.3)
Chloride: 107 mmol/L (ref 98–111)
Creatinine, Ser: 1.06 mg/dL — ABNORMAL HIGH (ref 0.44–1.00)
GFR, Estimated: 52 mL/min — ABNORMAL LOW (ref >60)
Glucose, Bld: 157 mg/dL — ABNORMAL HIGH (ref 70–99)
Potassium: 4 mmol/L (ref 3.5–5.1)
Sodium: 139 mmol/L (ref 135–145)

## 2024-08-12 LAB — CBC
HCT: 33.1 % — ABNORMAL LOW (ref 36.0–46.0)
Hemoglobin: 10.5 g/dL — ABNORMAL LOW (ref 12.0–15.0)
MCH: 32.8 pg (ref 26.0–34.0)
MCHC: 31.7 g/dL (ref 30.0–36.0)
MCV: 103.4 fL — ABNORMAL HIGH (ref 80.0–100.0)
Platelets: 119 K/uL — ABNORMAL LOW (ref 150–400)
RBC: 3.2 MIL/uL — ABNORMAL LOW (ref 3.87–5.11)
RDW: 13.1 % (ref 11.5–15.5)
WBC: 5.1 K/uL (ref 4.0–10.5)
nRBC: 0 % (ref 0.0–0.2)

## 2024-08-12 MED ORDER — BISOPROLOL FUMARATE 5 MG PO TABS
5.0000 mg | ORAL_TABLET | Freq: Every day | ORAL | Status: DC
  Administered 2024-08-12 – 2024-08-16 (×5): 5 mg via ORAL
  Filled 2024-08-12 (×5): qty 1

## 2024-08-12 MED ORDER — CHLORHEXIDINE GLUCONATE CLOTH 2 % EX PADS
6.0000 | MEDICATED_PAD | Freq: Every day | CUTANEOUS | Status: DC
Start: 2024-08-12 — End: 2024-08-16
  Administered 2024-08-12 – 2024-08-16 (×5): 6 via TOPICAL

## 2024-08-12 MED ORDER — HYDROCHLOROTHIAZIDE 12.5 MG PO TABS
6.2500 mg | ORAL_TABLET | Freq: Every day | ORAL | Status: DC
  Administered 2024-08-12 – 2024-08-16 (×5): 6.25 mg via ORAL
  Filled 2024-08-12 (×5): qty 1

## 2024-08-12 NOTE — Consult Note (Addendum)
 WOC Nurse ostomy consult note Pt had colostomy surgery performed yesterday. She is groggy and in pain.   Her son was supposed to come this am for a visit and teaching session was planned for that time, but something came up and he will not be present. I will plan to perform pouch change tomorrow, Pt is unsure of the time so I will check with her tomorrow morning to determine the plan.  Stoma is red and viable when visualized through the pouch, which is intact with a good seal. No stool or flatus, small amt bloody drainage.  5 sets of supplies left at the bedside for staff nurses' use: Use Supplies: barrier ring, Lawson # [id number] and convex justus Collum # [id number].  Educational materials in the room. Enrolled patient in [employer] DC program: NOT YET.  Thank-you,  Stephane Fought MSN, RN, CWOCN, CWCN-AP, CNS Contact Mon-Fri 0700-1500: [phone]

## 2024-08-12 NOTE — Plan of Care (Signed)
   Problem: Education: Goal: Knowledge of General Education information will improve Description Including pain rating scale, medication(s)/side effects and non-pharmacologic comfort measures Outcome: Progressing

## 2024-08-12 NOTE — Progress Notes (Signed)
 Patient has been very sedated during the night shift.  She is alert and oriented X4, However goes falls asleep right after answering question., Pt follows command but had difficulty getting her to dangle.

## 2024-08-12 NOTE — Plan of Care (Signed)
  Problem: Education: Goal: Understanding of discharge needs will improve Outcome: Progressing Goal: Verbalization of understanding of the causes of altered bowel function will improve Outcome: Progressing   Problem: Activity: Goal: Ability to tolerate increased activity will improve Outcome: Progressing

## 2024-08-12 NOTE — Progress Notes (Signed)
 1 Day Post-Op Robotic LAR Subjective: Slept well overnight, per pt.  No pain just sore, no nausea  Objective: Vital signs in last 24 hours: Temp:  [94.1 F (34.5 C)-98.1 F (36.7 C)] 97.3 F (36.3 C) (09/25 0522) Pulse Rate:  [60-83] 68 (09/25 0522) Resp:  [9-17] 16 (09/25 0522) BP: (126-185)/(57-81) 141/58 (09/25 0522) SpO2:  [89 %-100 %] 99 % (09/25 0522) Weight:  [72.6 kg] 72.6 kg (09/24 1039)   Intake/Output from previous day: 09/24 0701 - 09/25 0700 In: 3340 [P.O.:500; I.V.:2840] Out: 1055 [Urine:725; Drains:310; Stool:20] Intake/Output this shift: No intake/output data recorded.   General appearance: alert and cooperative GI: soft, non-distended  Incision: no significant drainage, ostomy beefy red  Lab Results:  Recent Labs    08/11/24 1125 08/12/24 0450  WBC 3.4* 5.1  HGB 12.2 10.5*  HCT 37.1 33.1*  PLT 137* 119*   BMET Recent Labs    08/11/24 1125 08/12/24 0450  NA 143 139  K 3.5 4.0  CL 108 107  CO2 22 22  GLUCOSE 96 157*  BUN 10 9  CREATININE 0.98 1.06*  CALCIUM  9.6 8.9   PT/INR No results for input(s): LABPROT, INR in the last 72 hours. ABG No results for input(s): PHART, HCO3 in the last 72 hours.  Invalid input(s): PCO2, PO2  MEDS, Scheduled  acetaminophen   1,000 mg Oral Q6H   alvimopan   12 mg Oral BID   artificial tears  1 drop Both Eyes Daily   bisoprolol   5 mg Oral Daily   And   hydrochlorothiazide   6.25 mg Oral Daily   enoxaparin  (LOVENOX ) injection  40 mg Subcutaneous Q24H   feeding supplement  237 mL Oral BID BM   gabapentin   300 mg Oral BID   pravastatin   40 mg Oral QPM   saccharomyces boulardii  250 mg Oral BID   sertraline   150 mg Oral QHS    Studies/Results: No results found.  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9) 07/02/2011   Family history of heart disease in female family member before age 23 07/02/2011   Dysthymia 07/02/2011    Expected post op course  Plan: Advance diet as tolerated SL IVFs JP: 310 Ambulate in hall, will engage PT as well D/c foley on POD 2    LOS: 1 day     .Samantha JAYSON Ned, MD [facility] Surgery, [place]    08/12/2024 8:06 AM

## 2024-08-13 LAB — CBC
HCT: 34.1 % — ABNORMAL LOW (ref 36.0–46.0)
Hemoglobin: 10.6 g/dL — ABNORMAL LOW (ref 12.0–15.0)
MCH: 32.5 pg (ref 26.0–34.0)
MCHC: 31.1 g/dL (ref 30.0–36.0)
MCV: 104.6 fL — ABNORMAL HIGH (ref 80.0–100.0)
Platelets: 126 K/uL — ABNORMAL LOW (ref 150–400)
RBC: 3.26 MIL/uL — ABNORMAL LOW (ref 3.87–5.11)
RDW: 13.3 % (ref 11.5–15.5)
WBC: 5.9 K/uL (ref 4.0–10.5)
nRBC: 0 % (ref 0.0–0.2)

## 2024-08-13 LAB — BASIC METABOLIC PANEL WITH GFR
Anion gap: 7 (ref 5–15)
BUN: 9 mg/dL (ref 8–23)
CO2: 26 mmol/L (ref 22–32)
Calcium: 9.3 mg/dL (ref 8.9–10.3)
Chloride: 110 mmol/L (ref 98–111)
Creatinine, Ser: 0.98 mg/dL (ref 0.44–1.00)
GFR, Estimated: 56 mL/min — ABNORMAL LOW (ref >60)
Glucose, Bld: 98 mg/dL (ref 70–99)
Potassium: 4.2 mmol/L (ref 3.5–5.1)
Sodium: 143 mmol/L (ref 135–145)

## 2024-08-13 NOTE — Evaluation (Signed)
 Occupational Therapy Evaluation Patient Details Name: Samantha Jenkins MRN: [id number] DOB: 12-Aug-1941 Today's Date: 08/13/2024   History of Present Illness   Pt is an 83 year old s/p LOW ANTERIOR RESECTION, END COLOSTOMY on 08/11/24.  PMHx: L THA, bil TKA, LBBB, obesity, HTN, neuropathy     Clinical Impressions PTA, patient lives alone and was mod independent using rollator for B/IADL's and mobility. Local family able to provide support however patient reports some concern about managing new colostomy.  Currently, patient presents with deficits outlined below (see OT Problem List for details) most significantly pain, decreased activity tolerance and balance limiting BADL's and functional mobility performance. Patient requires continued Acute care hospital level OT services to progress safety and functional performance and allow for discharge. Patient will benefit from continued inpatient follow up therapy, <3 hours/day.        If plan is discharge home, recommend the following:   A lot of help with walking and/or transfers;A lot of help with bathing/dressing/bathroom;Assistance with cooking/housework;Assist for transportation;Help with stairs or ramp for entrance     Functional Status Assessment   Patient has had a recent decline in their functional status and demonstrates the ability to make significant improvements in function in a reasonable and predictable amount of time.     Equipment Recommendations   None recommended by OT      Precautions/Restrictions   Precautions Precautions: Fall Precaution/Restrictions Comments: R JP drain, colostomy Restrictions Weight Bearing Restrictions Per Provider Order: No     Mobility Bed Mobility Overal bed mobility: Needs Assistance Bed Mobility: Rolling, Sidelying to Sit, Sit to Supine Rolling: Min assist, Used rails Sidelying to sit: Contact guard assist, Used rails   Sit to supine: Contact guard assist, Used rails, HOB  elevated   General bed mobility comments: verbal cues for log roll technique for pain control aqnd for recall and mngt of JP drain    Transfers Overall transfer level: Needs assistance Equipment used: Rolling walker (2 wheels) Transfers: Sit to/from Stand, Bed to chair/wheelchair/BSC Sit to Stand: Contact guard assist, From elevated surface     Step pivot transfers: Contact guard assist, Min assist, From elevated surface     General transfer comment: cues for hand placement, increased time for steadying      Balance Overall balance assessment: Mild deficits observed, not formally tested                                         ADL either performed or assessed with clinical judgement   ADL Overall ADL's : Needs assistance/impaired Eating/Feeding: Independent;Sitting   Grooming: Wash/dry hands;Oral care;Wash/dry face;Sitting;Set up   Upper Body Bathing: Contact guard assist;Sitting   Lower Body Bathing: Moderate assistance;Sit to/from stand   Upper Body Dressing : Contact guard assist;Sitting   Lower Body Dressing: Moderate assistance;Sit to/from stand   Toilet Transfer: Contact guard assist;Rolling walker (2 wheels);BSC/3in1   Toileting- Clothing Manipulation and Hygiene: Maximal assistance Toileting - Clothing Manipulation Details (indicate cue type and reason): new ostomy management     Functional mobility during ADLs: Contact guard assist;Minimal assistance;Rolling walker (2 wheels) General ADL Comments: new ostomy and JP drain needs with limited funtional reach to LE's     Vision Baseline Vision/History: 0 No visual deficits;1 Wears glasses Patient Visual Report: No change from baseline Vision Assessment?: Wears glasses for reading  Pertinent Vitals/Pain Pain Assessment Pain Assessment: 0-10 Pain Score: 2  Pain Location: abdomen Pain Descriptors / Indicators: Sore Pain Intervention(s): Monitored during session, Repositioned,  Premedicated before session     Extremity/Trunk Assessment Upper Extremity Assessment Upper Extremity Assessment: Right hand dominant;Overall [facility] for tasks assessed   Lower Extremity Assessment Lower Extremity Assessment: Defer to PT evaluation   Cervical / Trunk Assessment Cervical / Trunk Assessment: Normal   Communication Communication Communication: No apparent difficulties   Cognition Arousal: Alert Behavior During Therapy: WFL for tasks assessed/performed Cognition: No apparent impairments             OT - Cognition Comments: mild decreased awareness of JP drain and ostomy needs                 Following commands: Intact       Cueing  General Comments   Cueing Techniques: Verbal cues  new ostomy management, JP drain, decreased overall activity tolerance           Home Living Family/patient expects to be discharged to:: Private residence Living Arrangements: Alone Available Help at Discharge: Family Type of Home: Apartment Home Access: Level entry     Home Layout: One level     Bathroom Shower/Tub: Walk-in shower;Door   [employer] Toilet: Standard Bathroom Accessibility: Yes How Accessible: Accessible via walker Home Equipment: Agricultural consultant (2 wheels);Rollator (4 wheels)          Prior Functioning/Environment Prior Level of Function : Independent/Modified Independent             Mobility Comments: uses rollator ADLs Comments: independent, family assists with groceries    OT Problem List: Decreased activity tolerance;Impaired balance (sitting and/or standing);Decreased knowledge of use of DME or AE;Decreased knowledge of precautions;Pain   OT Treatment/Interventions: Self-care/ADL training;Therapeutic exercise;Neuromuscular education;Energy conservation;DME and/or AE instruction;Therapeutic activities;Patient/family education;Balance training      OT Goals(Current goals can be found in the care plan section)   Acute Rehab OT  Goals Patient Stated Goal: to manage this new bag OT Goal Formulation: With patient Time For Goal Achievement: 08/27/24 Potential to Achieve Goals: Good ADL Goals Pt Will Perform Lower Body Bathing: with supervision;sit to/from stand;with adaptive equipment Pt Will Perform Lower Body Dressing: with supervision;with adaptive equipment;sit to/from stand Pt Will Transfer to Toilet: regular height toilet;grab bars;ambulating;with contact guard assist Pt Will Perform Toileting - Clothing Manipulation and hygiene: with contact guard assist;sit to/from stand Pt Will Perform Tub/Shower Transfer: with min assist;ambulating;shower seat;Shower transfer Pt/caregiver will Perform Home Exercise Program: Increased strength;Both right and left upper extremity;With written HEP provided;With Supervision   OT Frequency:  Min 2X/week       AM-PAC OT 6 Clicks Daily Activity     Outcome Measure Help from another person eating meals?: None Help from another person taking care of personal grooming?: A Little Help from another person toileting, which includes using toliet, bedpan, or urinal?: A Lot Help from another person bathing (including washing, rinsing, drying)?: A Lot Help from another person to put on and taking off regular upper body clothing?: A Little Help from another person to put on and taking off regular lower body clothing?: A Lot 6 Click Score: 16   End of Session Equipment Utilized During Treatment: Rolling walker (2 wheels);Gait belt Nurse Communication: Mobility status  Activity Tolerance: Patient limited by fatigue;Patient limited by pain Patient left: in bed;with call bell/phone within reach;with bed alarm set  OT Visit Diagnosis: Unsteadiness on feet (R26.81);Muscle weakness (generalized) (M62.81)  Time: 8491-8466 OT Time Calculation (min): 25 min Charges:  OT General Charges $OT Visit: 1 Visit OT Evaluation $OT Eval Low Complexity: 1 Low  Analea Muller OT/L Acute  Rehabilitation Department  [phone]  08/13/2024, 4:20 PM

## 2024-08-13 NOTE — Plan of Care (Signed)
  Problem: Activity: Goal: Ability to tolerate increased activity will improve Outcome: Progressing   Problem: Activity: Goal: Risk for activity intolerance will decrease Outcome: Progressing   Problem: Nutrition: Goal: Adequate nutrition will be maintained Outcome: Completed/Met

## 2024-08-13 NOTE — Discharge Instructions (Signed)

## 2024-08-13 NOTE — TOC Initial Note (Signed)
 Transition of Care [facility]) - Initial/Assessment Note    Patient Details  Name: Samantha Jenkins MRN: [id number] Date of Birth: Jan 30, 1941  Transition [facility]) CM/SW Contact:    NORMAN ASPEN, LCSW Phone Number: 08/13/2024, 2:11 PM  Clinical Narrative:                  Met with pt and son this afternoon to review anticipated dc needs.  Pt confirms that she lives alone in an apt with son living locally who is supportive.  Son here today to receive colostomy education.  Pt and son both concerned, however, that pt may not be able to manage alone in the apt and asking if short term SNF might be an option.  Explained that we can certainly see if her insurance [facility]) would cover short term stay but this could be denied.  Encouraged them to plan how they can manage at home and pt does have some private duty caregivers she might be able to set up.  Will reach out to [facility] to have reviewed for SNF and ask covering TOC to follow up with pt and son once approval/ denial is decided.  Expected Discharge Plan: Home w Home Health Services (vs. possible SNF?) Barriers to Discharge: Continued Medical Work up   Patient Goals and CMS Choice Patient states their goals for this hospitalization and ongoing recovery are:: return home vs. snf rehab and then home          Expected Discharge Plan and Services In-house Referral: Clinical Social Work     Living arrangements for the past 2 months: Apartment                                      Prior Living Arrangements/Services Living arrangements for the past 2 months: Apartment Lives with:: Self Patient language and need for interpreter reviewed:: Yes Do you feel safe going back to the place where you live?: Yes      Need for Family Participation in Patient Care: Yes (Comment) Care giver support system in place?: No (comment)   Criminal Activity/Legal Involvement Pertinent to Current Situation/Hospitalization: No - Comment as  needed  Activities of Daily Living   ADL Screening (condition at time of admission) Independently performs ADLs?: Yes (appropriate for developmental age) Is the patient deaf or have difficulty hearing?: No Does the patient have difficulty seeing, even when wearing glasses/contacts?: No Does the patient have difficulty concentrating, remembering, or making decisions?: No  Permission Sought/Granted Permission sought to share information with : Family Supports Permission granted to share information with : Yes, Verbal Permission Granted  Share Information with NAME: son, Sammy Amy @ [phone]           Emotional Assessment Appearance:: Appears stated age Attitude/Demeanor/Rapport: Gracious Affect (typically observed): Accepting Orientation: : Oriented to Self, Oriented to Place, Oriented to  Time, Oriented to Situation Alcohol  / Substance Use: Not Applicable Psych Involvement: No (comment)  Admission diagnosis:  Rectal cancer (HCC) [C20] Patient Active Problem List   Diagnosis Date Noted   Rectal cancer (HCC) 08/11/2024   Port-A-Cath in place 01/13/2024   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   s/p left total knee arthroplasty  07/17/2021   S/P total knee arthroplasty, left 07/17/2021   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   Hypokalemia 06/07/2020   Right knee OA 06/06/2020   Status post  total right knee replacement 06/06/2020   Nephrolithiasis 03/23/2020   Pain in joint of right shoulder 12/24/2017   Arthritis of left knee 12/04/2017   Osteoarthritis of knee 12/03/2017   Pain in right knee 12/03/2017   Arthritis 07/11/2015   LBBB (left bundle branch block) 04/08/2013   Metabolic syndrome 04/08/2013   Hypertension 07/02/2011   Hyperlipidemia LDL goal <100 07/02/2011   Obesity (BMI 30-39.9) 07/02/2011   Family history of heart disease in female family member before age 72 07/02/2011   Dysthymia 07/02/2011   PCP:  Leonel Cole, MD Pharmacy:   [facility] - [place], [place] - [address] [place] [address] Phone: [phone] Fax: [phone]     Social Drivers of Health (SDOH) Social History: SDOH Screenings   Food Insecurity: No Food Insecurity (08/12/2024)  Housing: Low Risk  (08/12/2024)  Transportation Needs: No Transportation Needs (08/12/2024)  Utilities: Not At Risk (08/12/2024)  Depression (PHQ2-9): Low Risk  (10/21/2023)  Social Connections: Socially Isolated (08/12/2024)  Tobacco Use: Medium Risk (08/11/2024)   SDOH Interventions: Food Insecurity Interventions: Intervention Not Indicated Housing Interventions: Intervention Not Indicated Transportation Interventions: Intervention Not Indicated Utilities Interventions: Intervention Not Indicated Social Connections Interventions: Intervention Not Indicated   Readmission Risk Interventions    08/13/2024    2:07 PM  Readmission Risk Prevention Plan  Post Dischage Appt Complete  Medication Screening Complete  Transportation Screening Complete

## 2024-08-13 NOTE — Evaluation (Signed)
 Physical Therapy Evaluation Patient Details Name: Samantha Jenkins MRN: [id number] DOB: [birth year] Today's Date: 08/13/2024  History of Present Illness  Pt is an 83 year old s/p LOW ANTERIOR RESECTION, END COLOSTOMY on 08/11/24.  PMHx: L THA, bil TKA, LBBB, obesity, HTN, neuropathy  Clinical Impression  Pt admitted with above diagnosis.  Pt currently with functional limitations due to the deficits listed below (see PT Problem List). Pt will benefit from acute skilled PT to increase their independence and safety with mobility to allow discharge.  Pt reports just being tired and sore.  Pt states she did not sleep well last night but she has been up ambulating early this morning.  Pt ambulated again in hallway good distance and has rollator at home.  Pt states her son had her move into a house without stairs.  Pt anticipates return home at d/c.  Pt will likely progress well with no f/u PT needs.  Pt encouraged to continue ambulating with staff to assist in recovery from her surgery.         If plan is discharge home, recommend the following: Assistance with cooking/housework;Help with stairs or ramp for entrance   Can travel by private vehicle        Equipment Recommendations None recommended by PT  Recommendations for Other Services       Functional Status Assessment Patient has had a recent decline in their functional status and demonstrates the ability to make significant improvements in function in a reasonable and predictable amount of time.     Precautions / Restrictions Precautions Precautions: Fall Precaution/Restrictions Comments: R JP drain, colostomy      Mobility  Bed Mobility Overal bed mobility: Needs Assistance Bed Mobility: Rolling, Sidelying to Sit, Sit to Supine Rolling: Min assist, Used rails Sidelying to sit: Contact guard assist, Used rails   Sit to supine: Contact guard assist, Used rails, HOB elevated   General bed mobility comments: verbal cues for log roll  technique for pain control    Transfers Overall transfer level: Needs assistance Equipment used: Rolling walker (2 wheels) Transfers: Sit to/from Stand Sit to Stand: Contact guard assist           General transfer comment: reliant on UE support to self assist    Ambulation/Gait Ambulation/Gait assistance: Contact guard assist Gait Distance (Feet): 350 Feet Assistive device: Rolling walker (2 wheels) Gait Pattern/deviations: Trunk flexed, Step-through pattern, Decreased stride length Gait velocity: decr     General Gait Details: verbal cues for RW positioning and posture, pt reports pain improved once ambulating  Stairs            Wheelchair Mobility     Tilt Bed    Modified Rankin (Stroke Patients Only)       Balance Overall balance assessment: Mild deficits observed, not formally tested (reports last fall approx one year ago)                                           Pertinent Vitals/Pain Pain Assessment Pain Assessment: 0-10 Pain Score: 5  Pain Location: abdomen Pain Descriptors / Indicators: Sore Pain Intervention(s): Repositioned, Monitored during session, Premedicated before session    Home Living Family/patient expects to be discharged to:: Private residence Living Arrangements: Alone Available Help at Discharge: Family Type of Home: House Home Access: Level entry       Home Layout: One level  Home Equipment: Agricultural consultant (2 wheels);Rollator (4 wheels)      Prior Function Prior Level of Function : Independent/Modified Independent             Mobility Comments: uses rollator       Extremity/Trunk Assessment        Lower Extremity Assessment Lower Extremity Assessment: Generalized weakness       Communication   Communication Communication: No apparent difficulties    Cognition Arousal: Alert Behavior During Therapy: WFL for tasks assessed/performed   PT - Cognitive impairments: No apparent  impairments                                 Cueing Cueing Techniques: Verbal cues     General Comments      Exercises     Assessment/Plan    PT Assessment Patient needs continued PT services  PT Problem List Decreased mobility;Decreased balance;Decreased activity tolerance;Decreased strength;Pain       PT Treatment Interventions DME instruction;Gait training;Balance training;Functional mobility training;Stair training;Patient/family education;Therapeutic activities;Therapeutic exercise    PT Goals (Current goals can be found in the Care Plan section)  Acute Rehab PT Goals PT Goal Formulation: With patient Time For Goal Achievement: 08/27/24 Potential to Achieve Goals: Good    Frequency Min 2X/week     Co-evaluation               AM-PAC PT 6 Clicks Mobility  Outcome Measure Help needed turning from your back to your side while in a flat bed without using bedrails?: A Little Help needed moving from lying on your back to sitting on the side of a flat bed without using bedrails?: A Little Help needed moving to and from a bed to a chair (including a wheelchair)?: A Little Help needed standing up from a chair using your arms (e.g., wheelchair or bedside chair)?: A Little Help needed to walk in hospital room?: A Little Help needed climbing 3-5 steps with a railing? : A Little 6 Click Score: 18    End of Session Equipment Utilized During Treatment: Gait belt Activity Tolerance: Patient tolerated treatment well Patient left: in bed;with call bell/phone within reach;with bed alarm set Nurse Communication: Mobility status PT Visit Diagnosis: Difficulty in walking, not elsewhere classified (R26.2);Muscle weakness (generalized) (M62.81)    Time: 8954-8897 PT Time Calculation (min) (ACUTE ONLY): 17 min   Charges:   PT Evaluation $PT Eval Low Complexity: 1 Low   PT General Charges $$ ACUTE PT VISIT: 1 Visit       Tari PT, DPT Physical  Therapist Acute Rehabilitation Services Office: [phone]   Kati L Payson 08/13/2024, 1:03 PM

## 2024-08-13 NOTE — Progress Notes (Signed)
 2 Days Post-Op Robotic LAR Subjective: Slept well overnight, pain meds working,  tolerating a reg diet, foley out, complains of some blurry vision with reading  Objective: Vital signs in last 24 hours: Temp:  [97.5 F (36.4 C)-98 F (36.7 C)] 97.5 F (36.4 C) (09/25 2120) Pulse Rate:  [54-56] 56 (09/25 2120) Resp:  [16-18] 16 (09/25 2120) BP: (84-149)/(47-69) 118/47 (09/25 2120) SpO2:  [97 %-100 %] 97 % (09/25 2120) Weight:  [75.7 kg] 75.7 kg (09/26 0719)   Intake/Output from previous day: 09/25 0701 - 09/26 0700 In: 960 [P.O.:960] Out: 2890 [Urine:2550; Drains:340] Intake/Output this shift: Total I/O In: -  Out: 30 [Drains:30]   General appearance: alert and cooperative GI: soft, non-distended  Incision: no significant drainage, ostomy beefy red  Lab Results:  Recent Labs    08/12/24 0450 08/13/24 0411  WBC 5.1 5.9  HGB 10.5* 10.6*  HCT 33.1* 34.1*  PLT 119* 126*   BMET Recent Labs    08/12/24 0450 08/13/24 0411  NA 139 143  K 4.0 4.2  CL 107 110  CO2 22 26  GLUCOSE 157* 98  BUN 9 9  CREATININE 1.06* 0.98  CALCIUM  8.9 9.3   PT/INR No results for input(s): LABPROT, INR in the last 72 hours. ABG No results for input(s): PHART, HCO3 in the last 72 hours.  Invalid input(s): PCO2, PO2  MEDS, Scheduled  acetaminophen   1,000 mg Oral Q6H   alvimopan   12 mg Oral BID   artificial tears  1 drop Both Eyes Daily   bisoprolol   5 mg Oral Daily   And   hydrochlorothiazide   6.25 mg Oral Daily   Chlorhexidine  Gluconate Cloth  6 each Topical Daily   enoxaparin  (LOVENOX ) injection  40 mg Subcutaneous Q24H   feeding supplement  237 mL Oral BID BM   gabapentin   300 mg Oral BID   pravastatin   40 mg Oral QPM   saccharomyces boulardii  250 mg Oral BID   sertraline   150 mg Oral QHS    Studies/Results: No results found.  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   Hypokalemia 06/07/2020   Right knee OA 06/06/2020   Status post total right knee replacement 06/06/2020   Nephrolithiasis 03/23/2020   Pain in joint of right shoulder 12/24/2017   Arthritis of left knee 12/04/2017   Osteoarthritis of knee 12/03/2017   Pain in right knee 12/03/2017   Arthritis 07/11/2015   LBBB (left bundle branch block) 04/08/2013   Metabolic syndrome 04/08/2013   Hypertension 07/02/2011   Hyperlipidemia LDL goal <100 07/02/2011   Obesity (BMI 30-39.9) 07/02/2011   Family history of heart disease in female family member before age 50 07/02/2011   Dysthymia 07/02/2011    Expected post op course  Plan: Cont reg diet D/c JP prior to d/c home Ambulate in hall, will engage PT as well Foley out, monitor UOP Ostomy teaching today    LOS: 2 days     .Bernarda JAYSON Ned, MD [facility], [place]    08/13/2024 8:18 AM

## 2024-08-13 NOTE — NC FL2 (Signed)
   MEDICAID FL2 LEVEL OF CARE FORM     IDENTIFICATION  Patient Name: Samantha Jenkins Birthdate: [birth year] Sex: female Admission Date (Current Location): 08/11/2024  [facility] and [place] Number:  Producer, television/film/video and Address:  [facility],  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   s/p left total knee arthroplasty  07/17/2021   S/P total knee arthroplasty, left 07/17/2021   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   Hypokalemia 06/07/2020   Right knee OA 06/06/2020   Status post total right knee replacement 06/06/2020   Nephrolithiasis 03/23/2020   Pain in joint of right shoulder 12/24/2017   Arthritis of left knee 12/04/2017   Osteoarthritis of knee 12/03/2017   Pain in right knee 12/03/2017   Arthritis 07/11/2015   LBBB (left bundle branch block) 04/08/2013   Metabolic syndrome 04/08/2013   Hypertension 07/02/2011   Hyperlipidemia LDL goal <100 07/02/2011   Obesity (BMI 30-39.9) 07/02/2011   Family history of heart disease in female family member before age 80 07/02/2011   Dysthymia 07/02/2011    Orientation RESPIRATION BLADDER Height & Weight     Self, Time, Situation, Place  Normal Continent Weight: 166 lb 14.2 oz (75.7 kg) Height:  5' 4.5 (163.8 cm)  BEHAVIORAL SYMPTOMS/MOOD NEUROLOGICAL BOWEL NUTRITION STATUS      Colostomy (Empty pouch when 1/3  to  full of stool/flatus Clean  bottom 2-inches of fecal pouch prior to resealing Assist patient in emptying pouch Change pouch twice weekly and PRN.  Write date of pouch application on pouch.) Diet (regular)  AMBULATORY STATUS COMMUNICATION OF NEEDS Skin   Limited Assist Verbally Other (Comment) (colostomy site)                       Personal Care Assistance Level of Assistance  Bathing, Dressing Bathing Assistance: Limited assistance   Dressing Assistance: Limited assistance     Functional Limitations Info  Sight, Hearing, Speech Sight Info: Adequate Hearing Info: Adequate Speech Info: Adequate    SPECIAL CARE FACTORS FREQUENCY  PT (By licensed PT), OT (By licensed OT)     PT Frequency: 5x/wk OT Frequency: 5x/wk            Contractures Contractures Info: Not present    Additional Factors Info  Code Status, Allergies Code Status Info: Full Allergies Info: Statins, Sulfa Antibiotics           Current Medications (08/13/2024):  This is the current hospital active medication list Current Facility-Administered Medications  Medication Dose Route Frequency Provider Last Rate Last Admin   acetaminophen  (TYLENOL ) tablet 1,000 mg  1,000 mg Oral Q6H Thomas, Alicia, MD   1,000 mg at 08/13/24 1224   albuterol  (PROVENTIL ) (2.5 MG/3ML) 0.083% nebulizer solution 2.5 mg  2.5 mg Nebulization Q6H PRN Thomas, Alicia, MD       alum & mag hydroxide-simeth (MAALOX/MYLANTA) 200-200-20 MG/5ML suspension 30 mL  30 mL Oral Q6H PRN Thomas, Alicia, MD       alvimopan  (ENTEREG ) capsule 12 mg  12 mg Oral BID Thomas, Alicia, MD   12 mg at 08/12/24 2122   artificial tears ophthalmic solution 1 drop  1 drop Both Eyes Daily Debby Hila, MD   1 drop at 08/13/24 9093   bisoprolol  (ZEBETA ) tablet 5 mg  5 mg Oral Daily Thomas, Alicia, MD   5 mg at 08/13/24 9094   And   hydrochlorothiazide  (HYDRODIURIL ) tablet 6.25 mg  6.25 mg Oral Daily Thomas, Alicia, MD   6.25 mg at 08/13/24 9094   Chlorhexidine  Gluconate Cloth 2 % PADS 6  each  6 each Topical Daily Thomas, Alicia, MD   6 each at 08/13/24 9093   diphenhydrAMINE  (BENADRYL ) 12.5 MG/5ML elixir 12.5 mg  12.5 mg Oral Q6H PRN Thomas, Alicia, MD       Or   diphenhydrAMINE  (BENADRYL ) injection 12.5 mg  12.5 mg Intravenous Q6H PRN Thomas, Alicia, MD       enoxaparin  (LOVENOX ) injection 40 mg  40 mg Subcutaneous Q24H Thomas, Alicia, MD   40 mg at 08/13/24 0724   feeding supplement (ENSURE SURGERY) liquid 237 mL  237 mL Oral BID BM Thomas, Alicia, MD   237 mL at 08/13/24 1412   gabapentin  (NEURONTIN ) capsule 300 mg  300 mg Oral BID Thomas, Alicia, MD   300 mg at 08/13/24 9093   HYDROmorphone  (DILAUDID ) injection 0.5 mg  0.5 mg Intravenous Q3H PRN Thomas, Alicia, MD   0.5 mg at 08/12/24 9781   ondansetron  (ZOFRAN ) tablet 4 mg  4 mg Oral Q6H PRN Thomas, Alicia, MD       Or   ondansetron  (ZOFRAN ) injection 4 mg  4 mg Intravenous Q6H PRN Thomas, Alicia, MD   4 mg at 08/12/24 0539   pravastatin  (PRAVACHOL ) tablet 40 mg  40 mg Oral QPM Thomas, Alicia, MD   40 mg at 08/12/24 1737   saccharomyces boulardii (FLORASTOR) capsule 250 mg  250 mg Oral BID Thomas, Alicia, MD   250 mg at 08/13/24 9092   sertraline  (ZOLOFT ) tablet 150 mg  150 mg Oral QHS Thomas, Alicia, MD   150 mg at 08/12/24 2122   simethicone  (MYLICON) chewable tablet 40 mg  40 mg Oral Q6H PRN Thomas, Alicia, MD       traMADol  (ULTRAM ) tablet 50-100 mg  50-100 mg Oral Q6H PRN Thomas, Alicia, MD   50 mg at 08/12/24 1121   traZODone  (DESYREL ) tablet 50 mg  50 mg Oral QHS PRN Thomas, Alicia, MD         Discharge Medications: Please see discharge summary for a list of discharge medications.  Relevant Imaging Results:  Relevant Lab Results:   Additional Information SS# [id number]  NORMAN ASPEN, LCSW

## 2024-08-13 NOTE — Consult Note (Addendum)
 WOC Nurse ostomy follow up Pt had colostomy surgery performed on 9/24.  Son at the bedside for pouch change demonstration and teaching session.   Stoma is dark red but viable, slightly above skin level, 1 1/2 inches.  Small amt bleeding from stoma edges.  No stool or flatus in the pouch.  Pt assisted with pouch change using a hand held mirror.  She was able to stretch and apply the barrier ring and apply the pouch, and open and close to empty.  Reviewed pouching routines and ordering supplies. 5 sets of each product left at the bedside for staff nurses' use; use supplies: barrier ring Gerlean # [id number] and convex pouch Lawson # [id number]. Educational materials left at the bedside.  Pt asks appropriate questions.  Enrolled patient in [place] Secure Start Discharge program: Yes, today WOC team will see Monday for another pouch change and teaching session.  Thank-you,  Stephane Fought MSN, RN, CWOCN, CWCN-AP, CNS Contact Mon-Fri 0700-1500: [phone]

## 2024-08-14 LAB — BASIC METABOLIC PANEL WITH GFR
Anion gap: 9 (ref 5–15)
BUN: 10 mg/dL (ref 8–23)
CO2: 25 mmol/L (ref 22–32)
Calcium: 9.1 mg/dL (ref 8.9–10.3)
Chloride: 108 mmol/L (ref 98–111)
Creatinine, Ser: 0.82 mg/dL (ref 0.44–1.00)
GFR, Estimated: >60 mL/min (ref >60)
Glucose, Bld: 98 mg/dL (ref 70–99)
Potassium: 3.9 mmol/L (ref 3.5–5.1)
Sodium: 141 mmol/L (ref 135–145)

## 2024-08-14 LAB — CBC
HCT: 34.6 % — ABNORMAL LOW (ref 36.0–46.0)
Hemoglobin: 10.9 g/dL — ABNORMAL LOW (ref 12.0–15.0)
MCH: 33 pg (ref 26.0–34.0)
MCHC: 31.5 g/dL (ref 30.0–36.0)
MCV: 104.8 fL — ABNORMAL HIGH (ref 80.0–100.0)
Platelets: 128 K/uL — ABNORMAL LOW (ref 150–400)
RBC: 3.3 MIL/uL — ABNORMAL LOW (ref 3.87–5.11)
RDW: 13.5 % (ref 11.5–15.5)
WBC: 6.5 K/uL (ref 4.0–10.5)
nRBC: 0 % (ref 0.0–0.2)

## 2024-08-14 NOTE — TOC Progression Note (Signed)
 Transition of Care [facility]) - Progression Note    Patient Details  Name: Samantha Jenkins MRN: [id number] Date of Birth: 1941-06-27  [facility]) CM/SW Contact  Tawni CHRISTELLA [name], LCSW Phone Number: 08/14/2024, 12:51 PM  Clinical Narrative:     CSW received call from [facility] pt was approved for SNF placement ref [phone]. CSW spoke with pt to present bed offers she has chosen [facility]. Care Management to follow.     Expected Discharge Plan: Home w Home Health Services (vs. possible SNF?) Barriers to Discharge: Continued Medical Work up               Expected Discharge Plan and Services In-house Referral: Clinical Social Work     Living arrangements for the past 2 months: Apartment                                       Social Drivers of Health (SDOH) Interventions SDOH Screenings   Food Insecurity: No Food Insecurity (08/12/2024)  Housing: Low Risk  (08/12/2024)  Transportation Needs: No Transportation Needs (08/12/2024)  Utilities: Not At Risk (08/12/2024)  Depression (PHQ2-9): Low Risk  (10/21/2023)  Social Connections: Socially Isolated (08/12/2024)  Tobacco Use: Medium Risk (08/11/2024)    Readmission Risk Interventions    08/13/2024    2:07 PM  Readmission Risk Prevention Plan  Post Dischage Appt Complete  Medication Screening Complete  Transportation Screening Complete

## 2024-08-14 NOTE — Plan of Care (Signed)
  Problem: Education: Goal: Verbalization of understanding of the causes of altered bowel function will improve Outcome: Progressing   Problem: Activity: Goal: Ability to tolerate increased activity will improve Outcome: Progressing   Problem: Bowel/Gastric: Goal: Gastrointestinal status for postoperative course will improve Outcome: Progressing   Problem: Clinical Measurements: Goal: Postoperative complications will be avoided or minimized Outcome: Progressing   Problem: Skin Integrity: Goal: Will show signs of wound healing Outcome: Progressing

## 2024-08-14 NOTE — Progress Notes (Signed)
 3 Days Post-Op   Subjective/Chief Complaint: Having bm, oob, voiding, tol diet   Objective: Vital signs in last 24 hours: Temp:  [98.4 F (36.9 C)-99 F (37.2 C)] 98.6 F (37 C) (09/27 0551) Pulse Rate:  [51-59] 51 (09/27 0551) Resp:  [15-16] 16 (09/27 0551) BP: (119-138)/(52-56) 130/52 (09/27 0551) SpO2:  [94 %-98 %] 96 % (09/27 0551) Weight:  [75.5 kg] 75.5 kg (09/27 0500)    Intake/Output from previous day: 09/26 0701 - 09/27 0700 In: 990 [P.O.:990] Out: 1050 [Urine:400; Drains:350; Stool:300] Intake/Output this shift: No intake/output data recorded.  General nad Ab soft nontender ostomy functional  Lab Results:  Recent Labs    08/13/24 0411 08/14/24 0429  WBC 5.9 6.5  HGB 10.6* 10.9*  HCT 34.1* 34.6*  PLT 126* 128*   BMET Recent Labs    08/13/24 0411 08/14/24 0429  NA 143 141  K 4.2 3.9  CL 110 108  CO2 26 25  GLUCOSE 98 98  BUN 9 10  CREATININE 0.98 0.82  CALCIUM  9.3 9.1   PT/INR No results for input(s): LABPROT, INR in the last 72 hours. ABG No results for input(s): PHART, HCO3 in the last 72 hours.  Invalid input(s): PCO2, PO2  Studies/Results: No results found.  Anti-infectives: Anti-infectives (From admission, onward)    Start     Dose/Rate Route Frequency Ordered Stop   08/12/24 0000  cefoTEtan  (CEFOTAN ) 2 g in sodium chloride  0.9 % 100 mL IVPB        2 g 200 mL/hr over 30 Minutes Intravenous Every 12 hours 08/11/24 2306 08/12/24 0019   08/11/24 1015  cefoTEtan  (CEFOTAN ) 2 g in sodium chloride  0.9 % 100 mL IVPB        2 g 200 mL/hr over 30 Minutes Intravenous On call to O.R. 08/11/24 1008 08/11/24 1400       Assessment/Plan: POD 3 LAR/colostomy -continue regular diet -dc jp prior to dc home -ambulate -awaiting dispo    Samantha Jenkins 08/14/2024

## 2024-08-15 DIAGNOSIS — Z96642 Presence of left artificial hip joint: Secondary | ICD-10-CM | POA: Diagnosis not present

## 2024-08-15 DIAGNOSIS — Z933 Colostomy status: Secondary | ICD-10-CM | POA: Diagnosis not present

## 2024-08-15 DIAGNOSIS — M6281 Muscle weakness (generalized): Secondary | ICD-10-CM | POA: Diagnosis not present

## 2024-08-15 DIAGNOSIS — Z96653 Presence of artificial knee joint, bilateral: Secondary | ICD-10-CM | POA: Diagnosis not present

## 2024-08-15 DIAGNOSIS — G609 Hereditary and idiopathic neuropathy, unspecified: Secondary | ICD-10-CM | POA: Diagnosis not present

## 2024-08-15 DIAGNOSIS — C20 Malignant neoplasm of rectum: Secondary | ICD-10-CM | POA: Diagnosis not present

## 2024-08-15 NOTE — Progress Notes (Signed)
 4 Days Post-Op   Subjective/Chief Complaint: Didn't sleep great but otherwise fine, was oob yesterday, tol diet   Objective: Vital signs in last 24 hours: Temp:  [97.5 F (36.4 C)-97.7 F (36.5 C)] 97.5 F (36.4 C) (09/28 0529) Pulse Rate:  [53-58] 53 (09/28 0529) Resp:  [16-18] 18 (09/28 0529) BP: (118-145)/(55-77) 145/77 (09/28 0529) SpO2:  [95 %-98 %] 98 % (09/28 0529) Last BM Date : 08/15/24 (colostomy)  Intake/Output from previous day: 09/27 0701 - 09/28 0700 In: 1080 [P.O.:1080] Out: 990 [Urine:700; Drains:215; Stool:75] Intake/Output this shift: Total I/O In: -  Out: 40 [Drains:40]  General nad Ab soft nontender ostomy functional  Lab Results:  Recent Labs    08/13/24 0411 08/14/24 0429  WBC 5.9 6.5  HGB 10.6* 10.9*  HCT 34.1* 34.6*  PLT 126* 128*   BMET Recent Labs    08/13/24 0411 08/14/24 0429  NA 143 141  K 4.2 3.9  CL 110 108  CO2 26 25  GLUCOSE 98 98  BUN 9 10  CREATININE 0.98 0.82  CALCIUM  9.3 9.1   PT/INR No results for input(s): LABPROT, INR in the last 72 hours. ABG No results for input(s): PHART, HCO3 in the last 72 hours.  Invalid input(s): PCO2, PO2  Studies/Results: No results found.  Anti-infectives: Anti-infectives (From admission, onward)    Start     Dose/Rate Route Frequency Ordered Stop   08/12/24 0000  cefoTEtan  (CEFOTAN ) 2 g in sodium chloride  0.9 % 100 mL IVPB        2 g 200 mL/hr over 30 Minutes Intravenous Every 12 hours 08/11/24 2306 08/12/24 0019   08/11/24 1015  cefoTEtan  (CEFOTAN ) 2 g in sodium chloride  0.9 % 100 mL IVPB        2 g 200 mL/hr over 30 Minutes Intravenous On call to O.R. 08/11/24 1008 08/11/24 1400       Assessment/Plan: POD 4 LAR/colostomy -continue regular diet -dc jp prior to dc home -ambulate -approved to go to [facility]- she could go as early as today but plan is for tomorrow apparently- she should be fine for dc to [facility] tomorrow -lovenox   Donnice Bury 08/15/2024

## 2024-08-15 NOTE — Plan of Care (Signed)
   Problem: Education: Goal: Understanding of discharge needs will improve Outcome: Progressing   Problem: Activity: Goal: Ability to tolerate increased activity will improve Outcome: Progressing

## 2024-08-16 ENCOUNTER — Other Ambulatory Visit (HOSPITAL_COMMUNITY): Payer: Self-pay

## 2024-08-16 DIAGNOSIS — I447 Left bundle-branch block, unspecified: Secondary | ICD-10-CM | POA: Diagnosis not present

## 2024-08-16 DIAGNOSIS — F5101 Primary insomnia: Secondary | ICD-10-CM | POA: Diagnosis not present

## 2024-08-16 DIAGNOSIS — K9400 Colostomy complication, unspecified: Secondary | ICD-10-CM | POA: Diagnosis not present

## 2024-08-16 DIAGNOSIS — C20 Malignant neoplasm of rectum: Secondary | ICD-10-CM | POA: Diagnosis not present

## 2024-08-16 DIAGNOSIS — M6281 Muscle weakness (generalized): Secondary | ICD-10-CM | POA: Diagnosis not present

## 2024-08-16 DIAGNOSIS — Z483 Aftercare following surgery for neoplasm: Secondary | ICD-10-CM | POA: Diagnosis not present

## 2024-08-16 DIAGNOSIS — Z7189 Other specified counseling: Secondary | ICD-10-CM | POA: Diagnosis not present

## 2024-08-16 DIAGNOSIS — Z933 Colostomy status: Secondary | ICD-10-CM | POA: Diagnosis not present

## 2024-08-16 DIAGNOSIS — I10 Essential (primary) hypertension: Secondary | ICD-10-CM | POA: Diagnosis not present

## 2024-08-16 DIAGNOSIS — F4320 Adjustment disorder, unspecified: Secondary | ICD-10-CM | POA: Diagnosis not present

## 2024-08-16 DIAGNOSIS — M1712 Unilateral primary osteoarthritis, left knee: Secondary | ICD-10-CM | POA: Diagnosis not present

## 2024-08-16 DIAGNOSIS — R2689 Other abnormalities of gait and mobility: Secondary | ICD-10-CM | POA: Diagnosis not present

## 2024-08-16 MED ORDER — TRAMADOL HCL 50 MG PO TABS
50.0000 mg | ORAL_TABLET | Freq: Four times a day (QID) | ORAL | 0 refills | Status: AC | PRN
  Filled 2024-08-16 (×2): qty 30, 4d supply, fill #0

## 2024-08-16 NOTE — Plan of Care (Signed)
  Problem: Education: Goal: Understanding of discharge needs will improve Outcome: Progressing Goal: Verbalization of understanding of the causes of altered bowel function will improve Outcome: Progressing

## 2024-08-16 NOTE — Discharge Summary (Signed)
 Physician Discharge Summary  Patient ID: Samantha Jenkins MRN: [id number] DOB/AGE: 84/06/1941 84 y.o.  Admit date: 08/11/2024 Discharge date: 08/16/2024  Admission Diagnoses: Rectal cancer Discharge Diagnoses:  Principal Problem:   Rectal cancer [facility])   Discharged Condition: good  Hospital Course: Patient was admitted to the med surg floor after surgery.  Diet was advanced as tolerated.  Patient began to have bowel function on postop day 3.  She underwent PT and OT eval.  Pt was felt to benefit from short term post operative rehab.  By postop day 5, she was tolerating a solid diet and pain was controlled with oral medications.  She was urinating without difficulty and ambulating without assistance.  Patient was felt to be in stable condition for discharge to short term SNF.   Consults: None  Significant Diagnostic Studies: labs: cbc, bmet  Treatments: IV hydration, analgesia: acetaminophen , and surgery: RObotic LAR  Discharge Exam: Blood pressure (!) 132/55, pulse (!) 57, temperature 98 F (36.7 C), temperature source Oral, resp. rate 16, height 5' 4.5 (1.638 m), weight 76.9 kg, SpO2 (!) 67%. General appearance: alert and cooperative GI: soft, non-distended Incision/Wound: ostomy healing well  Disposition: SNF   Allergies as of 08/16/2024       Reactions   Statins Other (See Comments)   Muscle aches can take pravastatin    Sulfa Antibiotics Other (See Comments)   Blood in urine        Medication List     TAKE these medications    albuterol  108 (90 Base) MCG/ACT inhaler Commonly known as: VENTOLIN  HFA Inhale 2 puffs into the lungs every 6 (six) hours as needed for wheezing or shortness of breath.   bacitracin-polymyxin b ointment Commonly known as: POLYSPORIN Apply 1 application topically daily as needed (wound care).   bisoprolol -hydrochlorothiazide  5-6.25 MG tablet Commonly known as: ZIAC  TAKE 1 TABLET BY MOUTH DAILY.   lidocaine -prilocaine  cream Commonly  known as: EMLA  Apply to affected area once   pravastatin  40 MG tablet Commonly known as: PRAVACHOL  TAKE 1 TABLET EVERY DAY What changed:  how much to take how to take this when to take this   sertraline  100 MG tablet Commonly known as: ZOLOFT  TAKE 1 TABLET EVERY DAY What changed:  how much to take how to take this when to take this   Systane Complete PF 0.6 % Soln Generic drug: Propylene Glycol (PF) Place 1 drop into both eyes daily.   traMADol  50 MG tablet Commonly known as: ULTRAM  Take 1-2 tablets (50-100 mg total) by mouth every 6 (six) hours as needed for moderate pain (pain score 4-6).   traZODone  50 MG tablet Commonly known as: DESYREL  Take 1 tablet (50 mg total) by mouth at bedtime as needed for sleep.   UDDERLY SMOOTH EX Apply 1 Application topically as needed (dry/irritated skin).   Voltaren 1 % Gel Generic drug: diclofenac Sodium Apply 1 Application topically 2 (two) times daily as needed (pain.).        Contact information for follow-up providers     Debby Hila, MD. Schedule an appointment as soon as possible for a visit in 2 week(s).   Specialties: General Surgery, Colon and Rectal Surgery Contact information: [address] [place] [address] [phone]              Contact information for after-discharge care     Destination     [facility] [place] .   Service: Skilled Nursing Contact information: [address]  [place]  [phone]                     Signed: Bernarda JAYSON Ned 08/16/2024, 11:14 AM

## 2024-08-16 NOTE — Progress Notes (Signed)
 Discharge med in a secure bag delivered to pt in room. Patient's son is driving  her to [facility]. No printed script for Tramadol . Pt to notify facility on arrival that she has medication with her.

## 2024-08-16 NOTE — Progress Notes (Signed)
 Removed IV 22G L Lateral Wrist due to patient report of it being sore. Site was red and sore but no swelling noted. Catheter intact. Patient tolerated well. -Brynda Officer, RN, MSN

## 2024-08-16 NOTE — Progress Notes (Signed)
 Mobility Specialist - Progress Note    08/16/24 1047  Mobility  Activity Ambulated with assistance  Level of Assistance Contact guard assist, steadying assist  Assistive Device Four wheel walker  Distance Ambulated (ft) 650 ft  Range of Motion/Exercises Active  Activity Response Tolerated well  Mobility Referral Yes  Mobility visit 1 Mobility  Mobility Specialist Start Time (ACUTE ONLY) 1020  Mobility Specialist Stop Time (ACUTE ONLY) 1045  Mobility Specialist Time Calculation (min) (ACUTE ONLY) 25 min    Pt was received in bed and agreed to mobility. Returned to bed with all needs met. Call bell in reach.  [employer] - Mobility Specialist

## 2024-08-16 NOTE — Progress Notes (Signed)
 This RN called [facility] and gave report to [employer], Charity fundraiser. PIV removed, and discharge paperwork and discharge medication reviewed with patient, pt verbalized understanding. Pt ambulated to the lobby with walker accompanied by RN. Pts son will be her transport to receiving facility.

## 2024-08-16 NOTE — Consult Note (Signed)
 WOC Nurse ostomy follow up Stoma type/location: End colostomy LLQ Stomal assessment/size: 38 mm round, dark and dusk, but functional. Peristomal assessment: intact Treatment options for stomal/peristomal skin: ring #[id number]. Output aprox. 80 ml of soft stool inside the pouch at 0940. Ostomy pouching: 1pc. #[id number]. Education provided:  Provide educational ostomy care for the patient.  - Explained stoma characteristics; - Discussed in detail difference in flush and protruding stoma and rationale for the use of the different skin barriers; - The pouch need to be change twice per week or if is leaking. - Clean the skin with soup and water , or just water . Keep in mind that any product can stay at the skin before apply the next pouch;   - Cut the barrier as the ostomy size and shape; - Demonstrated measuring stoma, cleaning peristomal skin and stoma, used barrier ring; Addendum: the barrier ring is moldable, it is possible apply directly around the ostomy, or apply around the barrier cut.  - Take the protect plastic off the barrier. That can be used as a template for the next pouch system. I kept what I did in room. Addendum: The ostomy will change the size to a smaller one on the next weeks, keep measuring at least once a week to make sure if the size is correct.   - Apply on the skin, stretching a little the bottom of the ostomy site to apply correctly. - The warmed skin will interact with the barrier, making it sealed.   - Empty the pouch when becomes 1/3 full or 1/2 full. - Demonstrated how to open and close the lock in roll system. - A bag with filter will provide a release the gas without odor, does not need to protect against the water . - The pt can take a shower normally, the bag is waterproof, including the charcoal filter.  Enrolled patient in [place] Secure Start Discharge program: Yes (09/26)  WOC team will follow THURS. Please reconsult if further assistance is  needed. Thank-you,  Lela Holm RN, CNS, [employer], MSN.  (Phone [phone])

## 2024-08-17 ENCOUNTER — Telehealth: Payer: Self-pay | Admitting: Nurse Practitioner

## 2024-08-17 DIAGNOSIS — C20 Malignant neoplasm of rectum: Secondary | ICD-10-CM | POA: Diagnosis not present

## 2024-08-17 NOTE — Telephone Encounter (Signed)
 I lvm informing Samantha Jenkins that her appointments have been re-scheduled until the end of the month per a staff message I received.

## 2024-08-18 ENCOUNTER — Other Ambulatory Visit: Payer: Self-pay

## 2024-08-18 DIAGNOSIS — I10 Essential (primary) hypertension: Secondary | ICD-10-CM | POA: Diagnosis not present

## 2024-08-18 DIAGNOSIS — C20 Malignant neoplasm of rectum: Secondary | ICD-10-CM | POA: Diagnosis not present

## 2024-08-18 DIAGNOSIS — M1712 Unilateral primary osteoarthritis, left knee: Secondary | ICD-10-CM | POA: Diagnosis not present

## 2024-08-18 DIAGNOSIS — I447 Left bundle-branch block, unspecified: Secondary | ICD-10-CM | POA: Diagnosis not present

## 2024-08-20 ENCOUNTER — Inpatient Hospital Stay: Admitting: Nurse Practitioner

## 2024-08-20 ENCOUNTER — Inpatient Hospital Stay

## 2024-08-20 DIAGNOSIS — I10 Essential (primary) hypertension: Secondary | ICD-10-CM | POA: Diagnosis not present

## 2024-08-20 DIAGNOSIS — K9400 Colostomy complication, unspecified: Secondary | ICD-10-CM | POA: Diagnosis not present

## 2024-08-20 DIAGNOSIS — C20 Malignant neoplasm of rectum: Secondary | ICD-10-CM | POA: Diagnosis not present

## 2024-08-20 DIAGNOSIS — Z7189 Other specified counseling: Secondary | ICD-10-CM | POA: Diagnosis not present

## 2024-08-20 LAB — SURGICAL PATHOLOGY

## 2024-09-01 ENCOUNTER — Other Ambulatory Visit: Payer: Self-pay

## 2024-09-01 NOTE — Progress Notes (Signed)
 The proposed treatment discussed in conference is for discussion purpose only and is not a binding recommendation.  The patients have not been physically examined, or presented with their treatment options.  Therefore, final treatment plans cannot be decided.

## 2024-09-02 ENCOUNTER — Encounter (HOSPITAL_COMMUNITY): Payer: Self-pay | Admitting: Anatomic Pathology

## 2024-09-06 DIAGNOSIS — Z483 Aftercare following surgery for neoplasm: Secondary | ICD-10-CM | POA: Diagnosis not present

## 2024-09-06 DIAGNOSIS — Z933 Colostomy status: Secondary | ICD-10-CM | POA: Diagnosis not present

## 2024-09-06 DIAGNOSIS — I10 Essential (primary) hypertension: Secondary | ICD-10-CM | POA: Diagnosis not present

## 2024-09-06 DIAGNOSIS — C20 Malignant neoplasm of rectum: Secondary | ICD-10-CM | POA: Diagnosis not present

## 2024-09-08 DIAGNOSIS — Z433 Encounter for attention to colostomy: Secondary | ICD-10-CM | POA: Diagnosis not present

## 2024-09-08 DIAGNOSIS — G4700 Insomnia, unspecified: Secondary | ICD-10-CM | POA: Diagnosis not present

## 2024-09-08 DIAGNOSIS — F329 Major depressive disorder, single episode, unspecified: Secondary | ICD-10-CM | POA: Diagnosis not present

## 2024-09-08 DIAGNOSIS — Z96652 Presence of left artificial knee joint: Secondary | ICD-10-CM | POA: Diagnosis not present

## 2024-09-08 DIAGNOSIS — N189 Chronic kidney disease, unspecified: Secondary | ICD-10-CM | POA: Diagnosis not present

## 2024-09-08 DIAGNOSIS — Z483 Aftercare following surgery for neoplasm: Secondary | ICD-10-CM | POA: Diagnosis not present

## 2024-09-08 DIAGNOSIS — Z96642 Presence of left artificial hip joint: Secondary | ICD-10-CM | POA: Diagnosis not present

## 2024-09-08 DIAGNOSIS — Z87440 Personal history of urinary (tract) infections: Secondary | ICD-10-CM | POA: Diagnosis not present

## 2024-09-08 DIAGNOSIS — I129 Hypertensive chronic kidney disease with stage 1 through stage 4 chronic kidney disease, or unspecified chronic kidney disease: Secondary | ICD-10-CM | POA: Diagnosis not present

## 2024-09-08 DIAGNOSIS — I447 Left bundle-branch block, unspecified: Secondary | ICD-10-CM | POA: Diagnosis not present

## 2024-09-08 DIAGNOSIS — C20 Malignant neoplasm of rectum: Secondary | ICD-10-CM | POA: Diagnosis not present

## 2024-09-08 DIAGNOSIS — Z87891 Personal history of nicotine dependence: Secondary | ICD-10-CM | POA: Diagnosis not present

## 2024-09-08 DIAGNOSIS — J45909 Unspecified asthma, uncomplicated: Secondary | ICD-10-CM | POA: Diagnosis not present

## 2024-09-08 DIAGNOSIS — F419 Anxiety disorder, unspecified: Secondary | ICD-10-CM | POA: Diagnosis not present

## 2024-09-08 DIAGNOSIS — C775 Secondary and unspecified malignant neoplasm of intrapelvic lymph nodes: Secondary | ICD-10-CM | POA: Diagnosis not present

## 2024-09-08 DIAGNOSIS — Z9181 History of falling: Secondary | ICD-10-CM | POA: Diagnosis not present

## 2024-09-08 DIAGNOSIS — E785 Hyperlipidemia, unspecified: Secondary | ICD-10-CM | POA: Diagnosis not present

## 2024-09-08 DIAGNOSIS — E8881 Metabolic syndrome: Secondary | ICD-10-CM | POA: Diagnosis not present

## 2024-09-15 ENCOUNTER — Other Ambulatory Visit: Payer: Self-pay | Admitting: Nurse Practitioner

## 2024-09-15 DIAGNOSIS — C20 Malignant neoplasm of rectum: Secondary | ICD-10-CM

## 2024-09-15 NOTE — Progress Notes (Signed)
 Patient Care Team: Samantha Cole, MD as PCP - General (Family Medicine) Samantha Callander, MD as Consulting Physician (Hematology and Oncology)  Clinic Day:  09/16/2024  Referring physician: Leonel Cole, MD  ASSESSMENT & PLAN:   Assessment & Plan: Adenocarcinoma of rectum (HCC) -cT3bN1M0, stage IIIB, G2 -presented with rectal bleeding.  I reviewed her recent colonoscopy findings, biopsy results, and her staging CT images.  She has low rectal adenocarcinoma, CT is suspicious for nodal metastasis but no distant mets   -pelvic MRI 10/24/2023 showed T3N1 disease -pt was seen by surgeon Dr. Debby and rad/onc Dr. Dewey -she agrees with TNT, and started concurrent chemoRT on 11/03/2023 and completed on 12/17/2022 -she started chemo FOLFOX on 01/14/2024, plan for for 8 cycles , she completed on 04/20/2024  - Unfortunately flexible sigmoidoscopy on July 06, 2024 showed residual tumor in the rectum, and a biopsy confirmed. -Surgery is recommended, she had surgery on 08/11/2024 per Dr. Debby, anterior resection with end colostomy.  Active surveillance has been suggested.  CT CAP for surveillance every 6 to 12 months (01/2025).  Labs with flush and follow-up in 3 months.   Stage III rectal cancer Patient underwent  anterior resection with end colostomy on 08/11/2024.  She spent 3 weeks in SNF for rehabilitation.  She was discharged home on 09/06/2024.  Continues to regain strength and appetite.  She has upcoming appointment in ostomy clinic.  She does have plenty of ostomy supplies and has basic understanding of how to maintain appliances.  CEA today is normal at 3.88.  Surveillance CT CAP in March 2026.  Labs with follow-up in 3 months.  Hypokalemia Potassium is 3.2 today.  Will start oral potassium 20 mEq daily.  Will recheck labs with port flush in 6 weeks.  Adjust dosing if indicated.  Weight loss Patient with 10 pound weight loss since surgery.  Did have hard time with appetite during recovery.  She states  that food was not good and was difficult for her to eat.  Reports her appetite is slowly improving.  We discussed increased protein and calorie intake.  Okay to substitute meals with protein shakes or supplements.  Gradually increase activity.  She does have PT and OT to help her regain strength after surgery.  Plan Labs reviewed. - Stable and improving anemia. - Mildly low potassium at 3.2. -- Add oral potassium 20 mEq daily.  Recheck level with port flush in 6 weeks. - CEA normal at 3.88. Active surveillance recommended.   - CT CAP in 01/2025. Maintain port flush appointments every 6 to 8 weeks. Labs/flush and follow-up in 3 months, sooner if needed.  The patient understands the plans discussed today and is in agreement with them.  She knows to contact our office if she develops concerns prior to her next appointment.  I provided 30 minutes of face-to-face time during this encounter and > 50% was spent counseling as documented under my assessment and plan.    Samantha Jenkins, Samantha Jenkins  [facility] - A DEPT OF Samantha Jenkins. [facility] [address] [place] [address] Dept: [phone] Dept Fax: [phone]   No orders of the defined types were placed in this encounter.     CHIEF COMPLAINT:  CC: Adenocarcinoma of the rectum  Current Treatment: Active surveillance  INTERVAL HISTORY:  Samantha Jenkins is here today for repeat clinical assessment.  Most recent visit with Dr. Lanny on 07/21/2024.  Waning options between surgery and  systemic therapy.  She underwentunderwent robotic assisted low anterior resection on 08/11/2024 with end colostomy.  She did spend some time in SNF for rehab.  She was able to return home on 09/06/2024.  Was referred to ostomy clinic after follow-up visit with Dr. Debby.  Her case was discussed in multidisciplinary tumor board on 09/01/2024.  Recommendation is for active surveillance.  She reports doing well, considering  her circumstances.  She is adapting lifestyle around new colostomy.  Does have some tenderness around the ostomy.  She reports increasing her strength.  Her appetite is improving.  She recognizes the weight loss she had following surgery.  She said the food and SNF was terrible and she was not able to eat very much.  She denies chest pain, chest pressure, or shortness of breath. She denies headaches or visual disturbances. She denies abdominal pain, nausea, vomiting, or changes in bladder habits.  She denies fevers or chills. She denies pain. Her appetite is good. Her weight has decreased 10 pounds over last months.  I have reviewed the past medical history, past surgical history, social history and family history with the patient and they are unchanged from previous note.  ALLERGIES:  is allergic to statins and sulfa antibiotics.  MEDICATIONS:  Current Outpatient Medications  Medication Sig Dispense Refill   potassium chloride  SA (KLOR-CON  M) 20 MEQ tablet Take 1 tablet (20 mEq total) by mouth daily. 30 tablet 1   albuterol  (PROVENTIL  HFA;VENTOLIN  HFA) 108 (90 BASE) MCG/ACT inhaler Inhale 2 puffs into the lungs every 6 (six) hours as needed for wheezing or shortness of breath. 1 Inhaler 0   bacitracin-polymyxin b (POLYSPORIN) ointment Apply 1 application topically daily as needed (wound care).     bisoprolol -hydrochlorothiazide  (ZIAC ) 5-6.25 MG tablet TAKE 1 TABLET BY MOUTH DAILY. 90 tablet 0   diclofenac Sodium (VOLTAREN) 1 % GEL Apply 1 Application topically 2 (two) times daily as needed (pain.).     Emollient (UDDERLY SMOOTH EX) Apply 1 Application topically as needed (dry/irritated skin).     lidocaine -prilocaine  (EMLA ) cream Apply to affected area once 30 g 3   pravastatin  (PRAVACHOL ) 40 MG tablet TAKE 1 TABLET EVERY DAY (Patient taking differently: every evening.) 90 tablet 0   Propylene Glycol, PF, (SYSTANE COMPLETE PF) 0.6 % SOLN Place 1 drop into both eyes daily.     sertraline  (ZOLOFT )  100 MG tablet TAKE 1 TABLET EVERY DAY (Patient taking differently: 150 mg every evening.) 90 tablet 0   traMADol  (ULTRAM ) 50 MG tablet Take 1-2 tablets (50-100 mg total) by mouth every 6 (six) hours as needed for moderate pain (pain score 4-6). 30 tablet 0   traZODone  (DESYREL ) 50 MG tablet Take 1 tablet (50 mg total) by mouth at bedtime as needed for sleep. 90 tablet 1   No current facility-administered medications for this visit.    HISTORY OF PRESENT ILLNESS:   Oncology History  Adenocarcinoma of rectum (HCC)  09/24/2023 Imaging   CT chest abdomen and pelvis with contrast  IMPRESSION: 1. Rectal primary with nodal metastasis in the mesorectum and sigmoid mesocolon. 2. No distant metastasis. 3. Chronic left-sided hydronephrosis with overlying cortical thinning and decreased/absent left renal function. Favor chronic ureteropelvic junction obstruction, given absence of hydroureter. 4. 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 5. Incidental findings, including: Coronary artery atherosclerosis. Aortic Atherosclerosis (ICD10-I70.0). Cholelithiasis. Bilateral nephrolithiasis.   10/02/2023 Initial Diagnosis   Adenocarcinoma of rectum (HCC)   10/24/2023 Cancer Staging  Staging form: Colon and Rectum, AJCC 8th Edition - Clinical stage from 10/24/2023: Stage IIIB (cT3, cN1, cM0) - Signed by Samantha Callander, MD on 10/31/2023 Histologic grade (G): G2 Histologic grading system: 4 grade system   10/29/2023 Imaging   Pelvic MRI FINDINGS: TUMOR LOCATION  Tumor distance from Anal Verge/Skin surface: 11.1 cm.  Tumor distance to Internal Anal sphincter: 7.4 cm.  TUMOR DESCRIPTION  Circumferential extent: 9 to 6 o'clock position, 75% of the total circumference, along the superior aspect.   Tumor Size and volume: 4.6 cm craniocaudal. Up to 1.9 cm in thickness.   T - CATEGORY   Extension through Muscularis Propria: Yes 1-21mm=T3b (approximately  1.6-1.8 mm.)   Shortest Distance of any tumor/node from Mesorectal fascia: 1.4 cm.   Extramural Vascular Invasion/Tumor Thrombus: No.   Invasion of Anterior Peritoneal Reflection: No.  Involvement of Adjacent Organs or Pelvic Sidewall: No.   Levator Ani Involvement: No.   N - CATEGORY   Mesorectal Lymph Nodes >=79mm: 1-3=N1   Extra-mesorectal Lymphadenopathy: No   Other: There are multiple sigmoid diverticula without imaging signs diverticulitis. Urinary bladder is decompressed. Surgically absent uterus. No large adnexal mass seen.   IMPRESSION: Rectal adenocarcinoma T stage: T3b   Rectal adenocarcinoma N stage: N1   Distance from tumor to the internal anal sphincter is 7.4 cm.       01/14/2024 -  Chemotherapy   Patient is on Treatment Plan : COLORECTAL FOLFOX q14d x 4 months     05/17/2024 Imaging   Pelvic MRI WO CM rectal cancer staging IMPRESSION: 1. Please note diffusion-weighted images are nondiagnostic due to susceptibility is artifact from the left hip hardware. 2. There is focal irregular thickening along the right wall of the upper rectum as described above which may represent residual viable tumor. Correlation with sigmoidoscopy and tissue sampling is recommended. 3. In the upper rectum from 12 to 3 o'clock position there is mild focal thickening (up to 6 mm) which is predominantly T2 dark and favored to represent treated tumor. 4. There is a stable well-circumscribed round approximately 8 x 8 mm sized perirectal lymph node. No new pathologically enlarged lymph nodes.       REVIEW OF SYSTEMS:   Constitutional: Denies fevers or chills.  She did have a 10 pound weight loss after surgery and during stay in rehab. Eyes: Denies blurriness of vision Ears, nose, mouth, throat, and face: Denies mucositis or sore throat Respiratory: Denies cough, dyspnea or wheezes Cardiovascular: Denies palpitation, chest discomfort or lower extremity swelling Gastrointestinal:  Denies nausea or  heartburn. She is adapting to life with colostomy.  She does have upcoming appointment in ostomy clinic. Skin: Denies abnormal skin rashes Lymphatics: Denies new lymphadenopathy or easy bruising Neurological:Denies numbness, tingling or new weaknesses Behavioral/Psych: Mood is stable, no new changes  All other systems were reviewed with the patient and are negative.   VITALS:   Today's Vitals   09/16/24 1119  BP: (!) 134/53  Pulse: 65  Resp: 13  Temp: 98.1 F (36.7 C)  TempSrc: Temporal  SpO2: 98%  Weight: 159 lb 4.8 oz (72.3 kg)   Body mass index is 26.92 kg/m.   Wt Readings from Last 3 Encounters:  09/16/24 159 lb 4.8 oz (72.3 kg)  08/16/24 169 lb 7.5 oz (76.9 kg)  07/06/24 160 lb (72.6 kg)    Body mass index is 26.92 kg/m.  Performance status (ECOG): 1 - Symptomatic but completely ambulatory  PHYSICAL EXAM:   GENERAL:alert, no distress and comfortable  SKIN: skin color, texture, turgor are normal, no rashes or significant lesions EYES: normal, Conjunctiva are pink and non-injected, sclera clear OROPHARYNX:no exudate, no erythema and lips, buccal mucosa, and tongue normal  NECK: supple, thyroid normal size, non-tender, without nodularity LYMPH:  no palpable lymphadenopathy in the cervical, axillary or inguinal LUNGS: clear to auscultation and percussion with normal breathing effort HEART: regular rate & rhythm and no murmurs and no lower extremity edema ABDOMEN:abdomen soft. Normal bowel sounds. Intact ostomy. Stoma slightly tender and pink.  Musculoskeletal:no cyanosis of digits and no clubbing  NEURO: alert & oriented x 3 with fluent speech, no focal motor/sensory deficits  LABORATORY DATA:  I have reviewed the data as listed    Component Value Date/Time   NA 143 09/16/2024 1020   K 3.2 (L) 09/16/2024 1020   CL 108 09/16/2024 1020   CO2 27 09/16/2024 1020   GLUCOSE 109 (H) 09/16/2024 1020   BUN 10 09/16/2024 1020   CREATININE 0.88 09/16/2024 1020    CREATININE 0.71 03/30/2015 0951   CALCIUM  9.0 09/16/2024 1020   PROT 6.4 (L) 09/16/2024 1020   ALBUMIN 3.6 09/16/2024 1020   AST 19 09/16/2024 1020   ALT 8 09/16/2024 1020   ALKPHOS 106 09/16/2024 1020   BILITOT 0.5 09/16/2024 1020   GFRNONAA >60 09/16/2024 1020   GFRAA 46 (L) 06/07/2020 0304     Lab Results  Component Value Date   WBC 4.7 09/16/2024   NEUTROABS 3.7 09/16/2024   HGB 11.1 (L) 09/16/2024   HCT 32.9 (L) 09/16/2024   MCV 97.1 09/16/2024   PLT 149 (L) 09/16/2024

## 2024-09-15 NOTE — Assessment & Plan Note (Addendum)
-  [id number], stage IIIB, G2 -presented with rectal bleeding.  I reviewed her recent colonoscopy findings, biopsy results, and her staging CT images.  She has low rectal adenocarcinoma, CT is suspicious for nodal metastasis but no distant mets   -pelvic MRI 10/24/2023 showed T3N1 disease -pt was seen by surgeon Dr. Debby and rad/onc Dr. Dewey -she agrees with TNT, and started concurrent chemoRT on 11/03/2023 and completed on 12/17/2022 -she started chemo FOLFOX on 01/14/2024, plan for for 8 cycles , she completed on 04/20/2024  - Unfortunately flexible sigmoidoscopy on July 06, 2024 showed residual tumor in the rectum, and a biopsy confirmed. -Surgery is recommended, she had surgery on 08/11/2024 per Dr. Debby, anterior resection with end colostomy.  Active surveillance has been suggested.  CT CAP for surveillance every 6 to 12 months (01/2025).  Labs with flush and follow-up in 3 months.

## 2024-09-16 ENCOUNTER — Inpatient Hospital Stay: Attending: Radiation Oncology

## 2024-09-16 ENCOUNTER — Inpatient Hospital Stay (HOSPITAL_BASED_OUTPATIENT_CLINIC_OR_DEPARTMENT_OTHER): Admitting: Nurse Practitioner

## 2024-09-16 VITALS — BP 134/53 | HR 65 | Temp 98.1°F | Resp 13 | Wt 159.3 lb

## 2024-09-16 DIAGNOSIS — C20 Malignant neoplasm of rectum: Secondary | ICD-10-CM | POA: Diagnosis not present

## 2024-09-16 DIAGNOSIS — R509 Fever, unspecified: Secondary | ICD-10-CM | POA: Diagnosis not present

## 2024-09-16 DIAGNOSIS — R051 Acute cough: Secondary | ICD-10-CM | POA: Diagnosis not present

## 2024-09-16 DIAGNOSIS — R0981 Nasal congestion: Secondary | ICD-10-CM | POA: Diagnosis not present

## 2024-09-16 LAB — CBC WITH DIFFERENTIAL (CANCER CENTER ONLY)
Abs Immature Granulocytes: 0.01 K/uL (ref 0.00–0.07)
Basophils Absolute: 0 K/uL (ref 0.0–0.1)
Basophils Relative: 0 %
Eosinophils Absolute: 0.1 K/uL (ref 0.0–0.5)
Eosinophils Relative: 2 %
HCT: 32.9 % — ABNORMAL LOW (ref 36.0–46.0)
Hemoglobin: 11.1 g/dL — ABNORMAL LOW (ref 12.0–15.0)
Immature Granulocytes: 0 %
Lymphocytes Relative: 11 %
Lymphs Abs: 0.5 K/uL — ABNORMAL LOW (ref 0.7–4.0)
MCH: 32.7 pg (ref 26.0–34.0)
MCHC: 33.7 g/dL (ref 30.0–36.0)
MCV: 97.1 fL (ref 80.0–100.0)
Monocytes Absolute: 0.4 K/uL (ref 0.1–1.0)
Monocytes Relative: 9 %
Neutro Abs: 3.7 K/uL (ref 1.7–7.7)
Neutrophils Relative %: 78 %
Platelet Count: 149 K/uL — ABNORMAL LOW (ref 150–400)
RBC: 3.39 MIL/uL — ABNORMAL LOW (ref 3.87–5.11)
RDW: 14.1 % (ref 11.5–15.5)
WBC Count: 4.7 K/uL (ref 4.0–10.5)
nRBC: 0 % (ref 0.0–0.2)

## 2024-09-16 LAB — CMP (CANCER CENTER ONLY)
ALT: 8 U/L (ref 0–44)
AST: 19 U/L (ref 15–41)
Albumin: 3.6 g/dL (ref 3.5–5.0)
Alkaline Phosphatase: 106 U/L (ref 38–126)
Anion gap: 8 (ref 5–15)
BUN: 10 mg/dL (ref 8–23)
CO2: 27 mmol/L (ref 22–32)
Calcium: 9 mg/dL (ref 8.9–10.3)
Chloride: 108 mmol/L (ref 98–111)
Creatinine: 0.88 mg/dL (ref 0.44–1.00)
GFR, Estimated: >60 mL/min (ref >60)
Glucose, Bld: 109 mg/dL — ABNORMAL HIGH (ref 70–99)
Potassium: 3.2 mmol/L — ABNORMAL LOW (ref 3.5–5.1)
Sodium: 143 mmol/L (ref 135–145)
Total Bilirubin: 0.5 mg/dL (ref 0.0–1.2)
Total Protein: 6.4 g/dL — ABNORMAL LOW (ref 6.5–8.1)

## 2024-09-16 LAB — CEA (ACCESS): CEA (CHCC): 3.88 ng/mL (ref 0.00–5.00)

## 2024-09-17 ENCOUNTER — Other Ambulatory Visit: Payer: Self-pay

## 2024-09-21 DIAGNOSIS — Z933 Colostomy status: Secondary | ICD-10-CM | POA: Diagnosis not present

## 2024-09-21 DIAGNOSIS — C801 Malignant (primary) neoplasm, unspecified: Secondary | ICD-10-CM | POA: Diagnosis not present

## 2024-09-21 DIAGNOSIS — Z23 Encounter for immunization: Secondary | ICD-10-CM | POA: Diagnosis not present

## 2024-09-23 DIAGNOSIS — N130 Hydronephrosis with ureteropelvic junction obstruction: Secondary | ICD-10-CM | POA: Diagnosis not present

## 2024-09-23 DIAGNOSIS — N3281 Overactive bladder: Secondary | ICD-10-CM | POA: Diagnosis not present

## 2024-09-24 ENCOUNTER — Ambulatory Visit (HOSPITAL_COMMUNITY)
Admission: RE | Admit: 2024-09-24 | Discharge: 2024-09-24 | Disposition: A | Discharge status: Home / Self Care | Source: Ambulatory Visit | Attending: Nurse Practitioner | Admitting: Nurse Practitioner

## 2024-09-24 DIAGNOSIS — Z433 Encounter for attention to colostomy: Secondary | ICD-10-CM | POA: Insufficient documentation

## 2024-09-24 DIAGNOSIS — K9400 Colostomy complication, unspecified: Secondary | ICD-10-CM | POA: Diagnosis not present

## 2024-09-24 DIAGNOSIS — L24B3 Irritant contact dermatitis related to fecal or urinary stoma or fistula: Secondary | ICD-10-CM | POA: Diagnosis not present

## 2024-09-24 DIAGNOSIS — Z933 Colostomy status: Secondary | ICD-10-CM | POA: Diagnosis present

## 2024-09-24 NOTE — Discharge Instructions (Signed)
 1 piece convex pouch  Cut to 25 mm Remove old pouch and cleanse with soap and water  (don't recommend skin wipe)  Apply stoma powder and  skin prep Apply barrier ring  Lubricating deodorant to pouch  Ostomy belt for support

## 2024-09-24 NOTE — Progress Notes (Signed)
 [facility]   Reason for visit:  LLQ end colostomy  HPI:  Rectal cancer with resection, end colostomy Past Medical History:  Diagnosis Date   Adenocarcinoma (HCC)    Anxiety    Arthritis    knees ? 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   Chronic kidney disease    Depression    Dysthymia    FHx: cardiovascular disease    History of blood transfusion as child   History of kidney stones    Hyperlipidemia    Hypertension    LBBB (left bundle branch block) 2014   on ekg   Neuropathy    Non-functioning kidney    left kidney non functioning   Obesity    Pneumonia    hx of as a child   Rectal cancer (HCC)    Vitamin D  deficiency    Family History  Problem Relation Age of Onset   Breast cancer Mother    Heart disease Father    Colon cancer Neg Hx    Stomach cancer Neg Hx    Rectal cancer Neg Hx    Allergies  Allergen Reactions   Statins Other (See Comments)    Muscle aches can take pravastatin    Sulfa Antibiotics Other (See Comments)    Blood in urine   Current Outpatient Medications  Medication Sig Dispense Refill Last Dose/Taking   albuterol  (PROVENTIL  HFA;VENTOLIN  HFA) 108 (90 BASE) MCG/ACT inhaler Inhale 2 puffs into the lungs every 6 (six) hours as needed for wheezing or shortness of breath. 1 Inhaler 0    bacitracin-polymyxin b (POLYSPORIN) ointment Apply 1 application topically daily as needed (wound care).      bisoprolol -hydrochlorothiazide  (ZIAC ) 5-6.25 MG tablet TAKE 1 TABLET BY MOUTH DAILY. 90 tablet 0    diclofenac Sodium (VOLTAREN) 1 % GEL Apply 1 Application topically 2 (two) times daily as needed (pain.).      Emollient (UDDERLY SMOOTH EX) Apply 1 Application topically as needed (dry/irritated skin).      lidocaine -prilocaine  (EMLA ) cream Apply to affected area once 30 g 3    potassium chloride  SA (KLOR-CON  M) 20 MEQ tablet Take 1 tablet (20 mEq total) by mouth daily. 30 tablet 1     pravastatin  (PRAVACHOL ) 40 MG tablet TAKE 1 TABLET EVERY DAY (Patient taking differently: every evening.) 90 tablet 0    Propylene Glycol, PF, (SYSTANE COMPLETE PF) 0.6 % SOLN Place 1 drop into both eyes daily.      sertraline  (ZOLOFT ) 100 MG tablet TAKE 1 TABLET EVERY DAY (Patient taking differently: 150 mg every evening.) 90 tablet 0    traMADol  (ULTRAM ) 50 MG tablet Take 1-2 tablets (50-100 mg total) by mouth every 6 (six) hours as needed for moderate pain (pain score 4-6). 30 tablet 0    traZODone  (DESYREL ) 50 MG tablet Take 1 tablet (50 mg total) by mouth at bedtime as needed for sleep. 90 tablet 1    No current facility-administered medications for this encounter.   ROS  Review of Systems  Constitutional:  Positive for fatigue.  Respiratory:  Positive for cough.        Hx asthma  Cardiovascular:  Positive for chest pain.       Cardiovascular disease Hx hypertension  Gastrointestinal:  Positive for rectal pain.       Colostomy  Skin:        Peristomal irritation  Psychiatric/Behavioral:  The patient is nervous/anxious.        Hx depression  All other systems reviewed and are negative.  Vital signs:  BP (!) 145/65 (BP Location: Right Arm)   Pulse (!) 59   Temp 97.8 F (36.6 C) (Oral)   Resp 18   SpO2 97%  Exam:  Physical Exam Vitals reviewed.  HENT:     Mouth/Throat:     Mouth: Mucous membranes are moist.  Cardiovascular:     Rate and Rhythm: Normal rate.  Pulmonary:     Effort: Pulmonary effort is normal.  Abdominal:     Palpations: Abdomen is soft.  Skin:    General: Skin is warm and dry.     Findings: Erythema present.  Neurological:     Mental Status: She is alert and oriented to person, place, and time. Mental status is at baseline.  Psychiatric:        Behavior: Behavior normal.        Thought Content: Thought content normal.     Stoma type/location:  LLQ colostomy  Stomal assessment/size:  25 mm, pink moist and slightly budded Peristomal  assessment:  tender pink and weeping along bottom edge Treatment options for stomal/peristomal skin: stoma powder and skin prep  Barrier ring and 1 piece convex pouch  Adding ostomy belt Output: soft brown stool  Ostomy pouching: 1pc. Soft convex  Education provided:  pouch change performed discussed belt providing additional support and to help promote seal.     Impression/dx  Colostomy  Discussion  Adding convex pouch (1 piece soft)  Powder and skin prep to protect Ostomy belt for support and lubricating deodorant to promote drainage of stool and odor control  Plan  See back 2 weeks.   Will set up with [facility]    Visit time: 75 minutes.   Darice Cooley FNP-BC

## 2024-09-29 ENCOUNTER — Encounter: Payer: Self-pay | Admitting: Nurse Practitioner

## 2024-09-29 ENCOUNTER — Encounter: Payer: Self-pay | Admitting: Hematology

## 2024-09-29 MED ORDER — POTASSIUM CHLORIDE CRYS ER 20 MEQ PO TBCR: 20.0000 meq | EXTENDED_RELEASE_TABLET | Freq: Every day | ORAL | 1 refills | Status: DC

## 2024-09-30 ENCOUNTER — Other Ambulatory Visit (HOSPITAL_COMMUNITY): Payer: Self-pay | Admitting: Nurse Practitioner

## 2024-09-30 DIAGNOSIS — L24B3 Irritant contact dermatitis related to fecal or urinary stoma or fistula: Secondary | ICD-10-CM

## 2024-09-30 DIAGNOSIS — K9400 Colostomy complication, unspecified: Secondary | ICD-10-CM | POA: Insufficient documentation

## 2024-10-05 ENCOUNTER — Ambulatory Visit (HOSPITAL_COMMUNITY)
Admission: RE | Admit: 2024-10-05 | Discharge: 2024-10-05 | Disposition: A | Discharge status: Home / Self Care | Source: Ambulatory Visit | Attending: Hematology | Admitting: Hematology

## 2024-10-05 ENCOUNTER — Other Ambulatory Visit: Payer: Self-pay

## 2024-10-05 DIAGNOSIS — Z433 Encounter for attention to colostomy: Secondary | ICD-10-CM

## 2024-10-05 DIAGNOSIS — K5900 Constipation, unspecified: Secondary | ICD-10-CM

## 2024-10-05 DIAGNOSIS — Z933 Colostomy status: Secondary | ICD-10-CM | POA: Diagnosis not present

## 2024-10-05 NOTE — Progress Notes (Signed)
 [facility]   Reason for visit:  LLQ colostomy, recheck HPI:  Rectal cancer with end colostomy  Past Medical History:  Diagnosis Date   Adenocarcinoma (HCC)    Anxiety    Arthritis    knees ? 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   Chronic kidney disease    Depression    Dysthymia    FHx: cardiovascular disease    History of blood transfusion as child   History of kidney stones    Hyperlipidemia    Hypertension    LBBB (left bundle branch block) 2014   on ekg   Neuropathy    Non-functioning kidney    left kidney non functioning   Obesity    Pneumonia    hx of as a child   Rectal cancer (HCC)    Vitamin D  deficiency    Family History  Problem Relation Age of Onset   Breast cancer Mother    Heart disease Father    Colon cancer Neg Hx    Stomach cancer Neg Hx    Rectal cancer Neg Hx    Allergies  Allergen Reactions   Statins Other (See Comments)    Muscle aches can take pravastatin    Sulfa Antibiotics Other (See Comments)    Blood in urine   Current Outpatient Medications  Medication Sig Dispense Refill Last Dose/Taking   albuterol  (PROVENTIL  HFA;VENTOLIN  HFA) 108 (90 BASE) MCG/ACT inhaler Inhale 2 puffs into the lungs every 6 (six) hours as needed for wheezing or shortness of breath. 1 Inhaler 0    bacitracin-polymyxin b (POLYSPORIN) ointment Apply 1 application topically daily as needed (wound care).      bisoprolol -hydrochlorothiazide  (ZIAC ) 5-6.25 MG tablet TAKE 1 TABLET BY MOUTH DAILY. 90 tablet 0    diclofenac Sodium (VOLTAREN) 1 % GEL Apply 1 Application topically 2 (two) times daily as needed (pain.).      Emollient (UDDERLY SMOOTH EX) Apply 1 Application topically as needed (dry/irritated skin).      lidocaine -prilocaine  (EMLA ) cream Apply to affected area once 30 g 3    potassium chloride  SA (KLOR-CON  M) 20 MEQ tablet Take 1 tablet (20 mEq total) by mouth daily. 30 tablet 1     pravastatin  (PRAVACHOL ) 40 MG tablet TAKE 1 TABLET EVERY DAY (Patient taking differently: every evening.) 90 tablet 0    Propylene Glycol, PF, (SYSTANE COMPLETE PF) 0.6 % SOLN Place 1 drop into both eyes daily.      sertraline  (ZOLOFT ) 100 MG tablet TAKE 1 TABLET EVERY DAY (Patient taking differently: 150 mg every evening.) 90 tablet 0    traMADol  (ULTRAM ) 50 MG tablet Take 1-2 tablets (50-100 mg total) by mouth every 6 (six) hours as needed for moderate pain (pain score 4-6). 30 tablet 0    traZODone  (DESYREL ) 50 MG tablet Take 1 tablet (50 mg total) by mouth at bedtime as needed for sleep. 90 tablet 1    No current facility-administered medications for this encounter.   ROS  Review of Systems  Respiratory: Negative.    Cardiovascular:        Hx heart disease  Gastrointestinal:  Positive for constipation.       Colon cancer with resection.   Genitourinary:        Kidney disease.   Musculoskeletal: Negative.   Skin:  Positive for color change.  Psychiatric/Behavioral: Negative.    All other  systems reviewed and are negative.  Vital signs:  BP (!) 159/64   Pulse 63   Temp 98.1 F (36.7 C) (Oral)   Resp 18   SpO2 97%  Exam:  Physical Exam Vitals reviewed.  Constitutional:      Appearance: Normal appearance.  HENT:     Mouth/Throat:     Mouth: Mucous membranes are moist.  Cardiovascular:     Rate and Rhythm: Normal rate.  Pulmonary:     Effort: Pulmonary effort is normal.  Abdominal:     Palpations: Abdomen is soft.  Skin:    General: Skin is warm and dry.     Findings: Erythema present.  Neurological:     General: No focal deficit present.     Mental Status: She is alert and oriented to person, place, and time.  Psychiatric:        Behavior: Behavior normal.     Comments: Anxiety     Stoma type/location:  LLQ colostomy Stomal assessment/size:  25 mm, slightly budded skin pink but intact, improved Peristomal assessment:  erythema noted Treatment options for  stomal/peristomal skin: stoma powder  skin prep  barrier ring 1 piece soft convex pouch with belt.  Output: soft brown stool.  Some episodes of thick pasty stool Ostomy pouching: 1pc. Convex  Education provided:  Is out of supplies  Needs set up.  Will try again.  Has [employer].  Will send to [facility] and wait to see if [employer] case management requests records.  She has gotten a call from someone, unsure who.  Will research to see where communication was dropped to establish supplies.     Impression/dx  Colostomy  Constipation  Discussion  Admits she doesn't drink enough water .  WIll increase s See back 2 weeks.  Set up for supplies again.  Plan  Call clinic as needed. IF she doesn't here from supply company by next week, call back to clinic.     Visit time: 55 minutes.   Darice Cooley FNP-BC

## 2024-10-05 NOTE — Discharge Instructions (Signed)
 Will set up for supplies again.

## 2024-10-18 ENCOUNTER — Encounter (HOSPITAL_COMMUNITY): Payer: Self-pay | Admitting: Nurse Practitioner

## 2024-10-28 ENCOUNTER — Inpatient Hospital Stay: Attending: Radiation Oncology

## 2024-10-28 DIAGNOSIS — Z85048 Personal history of other malignant neoplasm of rectum, rectosigmoid junction, and anus: Secondary | ICD-10-CM | POA: Insufficient documentation

## 2024-10-28 DIAGNOSIS — C20 Malignant neoplasm of rectum: Secondary | ICD-10-CM

## 2024-10-28 LAB — CBC WITH DIFFERENTIAL (CANCER CENTER ONLY)
Abs Immature Granulocytes: 0.01 K/uL (ref 0.00–0.07)
Basophils Absolute: 0 K/uL (ref 0.0–0.1)
Basophils Relative: 0 %
Eosinophils Absolute: 0.1 K/uL (ref 0.0–0.5)
Eosinophils Relative: 3 %
HCT: 35.8 % — ABNORMAL LOW (ref 36.0–46.0)
Hemoglobin: 12.3 g/dL (ref 12.0–15.0)
Immature Granulocytes: 0 %
Lymphocytes Relative: 15 %
Lymphs Abs: 0.7 K/uL (ref 0.7–4.0)
MCH: 32.8 pg (ref 26.0–34.0)
MCHC: 34.4 g/dL (ref 30.0–36.0)
MCV: 95.5 fL (ref 80.0–100.0)
Monocytes Absolute: 0.4 K/uL (ref 0.1–1.0)
Monocytes Relative: 9 %
Neutro Abs: 3.4 K/uL (ref 1.7–7.7)
Neutrophils Relative %: 73 %
Platelet Count: 154 K/uL (ref 150–400)
RBC: 3.75 MIL/uL — ABNORMAL LOW (ref 3.87–5.11)
RDW: 14.5 % (ref 11.5–15.5)
WBC Count: 4.7 K/uL (ref 4.0–10.5)
nRBC: 0 % (ref 0.0–0.2)

## 2024-10-28 LAB — CMP (CANCER CENTER ONLY)
ALT: 15 U/L (ref 0–44)
AST: 32 U/L (ref 15–41)
Albumin: 4.2 g/dL (ref 3.5–5.0)
Alkaline Phosphatase: 123 U/L (ref 38–126)
Anion gap: 12 (ref 5–15)
BUN: 15 mg/dL (ref 8–23)
CO2: 26 mmol/L (ref 22–32)
Calcium: 10.3 mg/dL (ref 8.9–10.3)
Chloride: 105 mmol/L (ref 98–111)
Creatinine: 1.05 mg/dL — ABNORMAL HIGH (ref 0.44–1.00)
GFR, Estimated: 52 mL/min — ABNORMAL LOW (ref >60)
Glucose, Bld: 108 mg/dL — ABNORMAL HIGH (ref 70–99)
Potassium: 3.6 mmol/L (ref 3.5–5.1)
Sodium: 143 mmol/L (ref 135–145)
Total Bilirubin: 0.6 mg/dL (ref 0.0–1.2)
Total Protein: 7.3 g/dL (ref 6.5–8.1)

## 2024-10-29 ENCOUNTER — Inpatient Hospital Stay (HOSPITAL_COMMUNITY): Admission: RE | Admit: 2024-10-29 | Discharge: 2024-10-29 | Attending: *Deleted | Admitting: *Deleted

## 2024-10-29 DIAGNOSIS — L24B3 Irritant contact dermatitis related to fecal or urinary stoma or fistula: Secondary | ICD-10-CM

## 2024-10-29 DIAGNOSIS — Z433 Encounter for attention to colostomy: Secondary | ICD-10-CM | POA: Diagnosis not present

## 2024-10-29 NOTE — Progress Notes (Signed)
 [facility]   Reason for visit:  LLQ colostomy Still struggling with obtaining supplies.  HPI:  Rectal cancer with resection and end colostomy Past Medical History:  Diagnosis Date   Adenocarcinoma (HCC)    Anxiety    Arthritis    knees ? 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   Chronic kidney disease    Depression    Dysthymia    FHx: cardiovascular disease    History of blood transfusion as child   History of kidney stones    Hyperlipidemia    Hypertension    LBBB (left bundle branch block) 2014   on ekg   Neuropathy    Non-functioning kidney    left kidney non functioning   Obesity    Pneumonia    hx of as a child   Rectal cancer (HCC)    Vitamin D  deficiency    Family History  Problem Relation Age of Onset   Breast cancer Mother    Heart disease Father    Colon cancer Neg Hx    Stomach cancer Neg Hx    Rectal cancer Neg Hx    Allergies[1] Current Outpatient Medications  Medication Sig Dispense Refill Last Dose/Taking   albuterol  (PROVENTIL  HFA;VENTOLIN  HFA) 108 (90 BASE) MCG/ACT inhaler Inhale 2 puffs into the lungs every 6 (six) hours as needed for wheezing or shortness of breath. 1 Inhaler 0    bacitracin-polymyxin b (POLYSPORIN) ointment Apply 1 application topically daily as needed (wound care).      bisoprolol -hydrochlorothiazide  (ZIAC ) 5-6.25 MG tablet TAKE 1 TABLET BY MOUTH DAILY. 90 tablet 0    diclofenac Sodium (VOLTAREN) 1 % GEL Apply 1 Application topically 2 (two) times daily as needed (pain.).      Emollient (UDDERLY SMOOTH EX) Apply 1 Application topically as needed (dry/irritated skin).      lidocaine -prilocaine  (EMLA ) cream Apply to affected area once 30 g 3    potassium chloride  SA (KLOR-CON  M) 20 MEQ tablet Take 1 tablet (20 mEq total) by mouth daily. 30 tablet 1    pravastatin  (PRAVACHOL ) 40 MG tablet TAKE 1 TABLET EVERY DAY (Patient taking differently: every evening.)  90 tablet 0    Propylene Glycol, PF, (SYSTANE COMPLETE PF) 0.6 % SOLN Place 1 drop into both eyes daily.      sertraline  (ZOLOFT ) 100 MG tablet TAKE 1 TABLET EVERY DAY (Patient taking differently: 150 mg every evening.) 90 tablet 0    traMADol  (ULTRAM ) 50 MG tablet Take 1-2 tablets (50-100 mg total) by mouth every 6 (six) hours as needed for moderate pain (pain score 4-6). 30 tablet 0    traZODone  (DESYREL ) 50 MG tablet Take 1 tablet (50 mg total) by mouth at bedtime as needed for sleep. 90 tablet 1    No current facility-administered medications for this encounter.   ROS  Review of Systems  Constitutional:  Positive for fatigue.  Respiratory: Negative.    Cardiovascular: Negative.   Gastrointestinal:        Colostomy   Skin:  Positive for color change.  All other systems reviewed and are negative.  Vital signs:  BP 130/74   Pulse 72   Temp 98.2 F (36.8 C) (Oral)   Resp 18   SpO2 96%  Exam:  Physical Exam Vitals reviewed.  Constitutional:      Appearance: Normal appearance.  HENT:     Mouth/Throat:  Mouth: Mucous membranes are moist.  Cardiovascular:     Rate and Rhythm: Normal rate.  Pulmonary:     Effort: Pulmonary effort is normal.  Abdominal:     Palpations: Abdomen is soft.  Skin:    General: Skin is warm and dry.  Neurological:     Mental Status: She is alert and oriented to person, place, and time. Mental status is at baseline.  Psychiatric:        Mood and Affect: Mood normal.        Behavior: Behavior normal.     Stoma type/location:  LLQ colostomy  Stomal assessment/size:  pink moist 25 mm Peristomal assessment:  peristomal skin has improved since adding convexity, powder and skin prep Treatment options for stomal/peristomal skin: convex pouch, barrier ring, powder and skin prep  Output: soft brown stool   Ostomy pouching: 1pc. Convex (ITEM # [id number])  Education provided:  We call [employer] and they have the supply order, they have reached out to [employer] and are waiting to hear from them for approval.  We call [employer] who indicates that [employer] needs to call them.  Patient to call [facility] and ask them to call [employer].     Impression/dx  Colostomy Irritant dermatitis  Discussion  See back as needed.  Call clinic if not resolved.  Plan  I personally spent a total of 45 minutes in the care of the patient today including preparing to see the patient, getting/reviewing separately obtained history, performing a medically appropriate exam/evaluation, counseling and educating, placing orders, referring and communicating with other health care professionals, documenting clinical information in the EHR, communicating results, coordinating care, and pouch change/ostomy care.     Visit time: 45 minutes.   Darice Cooley FNP-BC        [1]  Allergies Allergen Reactions   Statins Other (See Comments)    Muscle aches can take pravastatin    Sulfa Antibiotics Other (See Comments)    Blood in urine

## 2024-12-15 ENCOUNTER — Other Ambulatory Visit: Payer: Self-pay

## 2024-12-15 DIAGNOSIS — C20 Malignant neoplasm of rectum: Secondary | ICD-10-CM

## 2024-12-16 ENCOUNTER — Inpatient Hospital Stay: Attending: Radiation Oncology | Admitting: Hematology

## 2024-12-16 ENCOUNTER — Inpatient Hospital Stay

## 2024-12-16 VITALS — BP 133/70 | HR 55 | Temp 98.2°F | Resp 18 | Ht 64.5 in | Wt 160.0 lb

## 2024-12-16 DIAGNOSIS — C20 Malignant neoplasm of rectum: Secondary | ICD-10-CM

## 2024-12-16 DIAGNOSIS — G620 Drug-induced polyneuropathy: Secondary | ICD-10-CM | POA: Diagnosis not present

## 2024-12-16 DIAGNOSIS — Z933 Colostomy status: Secondary | ICD-10-CM | POA: Diagnosis not present

## 2024-12-16 DIAGNOSIS — Z85048 Personal history of other malignant neoplasm of rectum, rectosigmoid junction, and anus: Secondary | ICD-10-CM | POA: Insufficient documentation

## 2024-12-16 DIAGNOSIS — T451X5A Adverse effect of antineoplastic and immunosuppressive drugs, initial encounter: Secondary | ICD-10-CM | POA: Diagnosis not present

## 2024-12-16 DIAGNOSIS — Z79899 Other long term (current) drug therapy: Secondary | ICD-10-CM | POA: Diagnosis not present

## 2024-12-16 LAB — CMP (CANCER CENTER ONLY)
ALT: 14 U/L (ref 0–44)
AST: 31 U/L (ref 15–41)
Albumin: 4.2 g/dL (ref 3.5–5.0)
Alkaline Phosphatase: 139 U/L — ABNORMAL HIGH (ref 38–126)
Anion gap: 12 (ref 5–15)
BUN: 13 mg/dL (ref 8–23)
CO2: 25 mmol/L (ref 22–32)
Calcium: 10.1 mg/dL (ref 8.9–10.3)
Chloride: 104 mmol/L (ref 98–111)
Creatinine: 1.04 mg/dL — ABNORMAL HIGH (ref 0.44–1.00)
GFR, Estimated: 53 mL/min — ABNORMAL LOW
Glucose, Bld: 111 mg/dL — ABNORMAL HIGH (ref 70–99)
Potassium: 3.7 mmol/L (ref 3.5–5.1)
Sodium: 141 mmol/L (ref 135–145)
Total Bilirubin: 0.5 mg/dL (ref 0.0–1.2)
Total Protein: 7.3 g/dL (ref 6.5–8.1)

## 2024-12-16 LAB — CBC WITH DIFFERENTIAL (CANCER CENTER ONLY)
Abs Immature Granulocytes: 0.01 10*3/uL (ref 0.00–0.07)
Basophils Absolute: 0 10*3/uL (ref 0.0–0.1)
Basophils Relative: 1 %
Eosinophils Absolute: 0.1 10*3/uL (ref 0.0–0.5)
Eosinophils Relative: 3 %
HCT: 36.4 % (ref 36.0–46.0)
Hemoglobin: 12.7 g/dL (ref 12.0–15.0)
Immature Granulocytes: 0 %
Lymphocytes Relative: 15 %
Lymphs Abs: 0.7 10*3/uL (ref 0.7–4.0)
MCH: 33 pg (ref 26.0–34.0)
MCHC: 34.9 g/dL (ref 30.0–36.0)
MCV: 94.5 fL (ref 80.0–100.0)
Monocytes Absolute: 0.4 10*3/uL (ref 0.1–1.0)
Monocytes Relative: 10 %
Neutro Abs: 3.2 10*3/uL (ref 1.7–7.7)
Neutrophils Relative %: 71 %
Platelet Count: 173 10*3/uL (ref 150–400)
RBC: 3.85 MIL/uL — ABNORMAL LOW (ref 3.87–5.11)
RDW: 14.6 % (ref 11.5–15.5)
WBC Count: 4.4 10*3/uL (ref 4.0–10.5)
nRBC: 0 % (ref 0.0–0.2)

## 2024-12-16 LAB — CEA (ACCESS): CEA (CHCC): 34.17 ng/mL — ABNORMAL HIGH (ref 0.00–5.00)

## 2024-12-16 NOTE — Assessment & Plan Note (Addendum)
-  [id number], stage IIIB, G2, ypT3N1c -presented with rectal bleeding.  I reviewed her recent colonoscopy findings, biopsy results, and her staging CT images.  She has low rectal adenocarcinoma, CT is suspicious for nodal metastasis but no distant mets   -pelvic MRI 10/24/2023 showed T3N1 disease -pt was seen by surgeon Dr. Debby and rad/onc Dr. Dewey -she agrees with TNT, and started concurrent chemoRT on 11/03/2023 and completed on 12/17/2022 -she started chemo FOLFOX on 01/14/2024, plan for for 8 cycles , she completed on 04/20/2024  - Unfortunately flexible sigmoidoscopy on July 06, 2024 showed residual tumor in the rectum, and a biopsy confirmed. -she underwent surgery on 08/11/2024 which showed residual T3N1c disease with clear margins  -continue cancer surveillance

## 2024-12-16 NOTE — Progress Notes (Signed)
 " [facility]   [phone] [phone]   Clinic Follow up Note   Patient Care Team: Leonel Cole, MD as PCP - General (Family Medicine) Lanny Callander, MD as Consulting Physician (Hematology and Oncology)  Date of Service:  12/16/2024  CHIEF COMPLAINT: f/u of rectal cancer  CURRENT THERAPY:  Cancer surveillance  Oncology History   Adenocarcinoma of rectum (HCC) -cT3bN1M0, stage IIIB, G2, ypT3N1c -presented with rectal bleeding.  I reviewed her recent colonoscopy findings, biopsy results, and her staging CT images.  She has low rectal adenocarcinoma, CT is suspicious for nodal metastasis but no distant mets   -pelvic MRI 10/24/2023 showed T3N1 disease -pt was seen by surgeon Dr. Debby and rad/onc Dr. Dewey -she agrees with TNT, and started concurrent chemoRT on 11/03/2023 and completed on 12/17/2022 -she started chemo FOLFOX on 01/14/2024, plan for for 8 cycles , she completed on 04/20/2024  - Unfortunately flexible sigmoidoscopy on July 06, 2024 showed residual tumor in the rectum, and a biopsy confirmed. -she underwent surgery on 08/11/2024 which showed residual T3N1c disease with clear margins  -continue cancer surveillance   Assessment & Plan Adenocarcinoma of rectum  She is physically well with no current evidence of disease. Surveillance and port maintenance continue as part of her care. - Ordered CT scan within the next month for surveillance of recurrence. - Instructed that if scan results are reassuring, nurse will notify her; otherwise, she will be seen in clinic. - Scheduled oncology follow-up every three months for ongoing surveillance. - Scheduled port flush every six to eight weeks. - Discussed potential for port removal after two years of remission, possibly later this year or next year.  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 She experiences ongoing challenges with ostomy care, including  difficulty sizing the ostomy bag opening due to abdominal folds, occasional leakage, and questions regarding stoma care products. She utilizes the ostomy clinic and finds the ostomy nurse helpful, though clinic hours are limited. No acute complications or peristomal skin breakdown were present. - Advised to contact ostomy clinic for further education and troubleshooting regarding ostomy care and supplies. - Reinforced that the ostomy bag opening should be slightly larger than the stoma. - Provided guidance that stoma powder is for skin irritation. - Encouraged continued use of support garments and belt for ostomy management. - Instructed to monitor for skin irritation and leakage and to seek ostomy clinic support as needed.   Plan - She is clinically doing well, concern for recurrence. - Encouraged her to follow-up with ostomy clinic if she has any problems with her ostomy. - Lab and follow-up in 3 months, continue port flush every 6 weeks. - She is due for surveillance CT scan, will order and get it done in the next few weeks.  We will call her with results.  SUMMARY OF ONCOLOGIC HISTORY: Oncology History  Adenocarcinoma of rectum (HCC)  09/24/2023 Imaging   CT chest abdomen and pelvis with contrast  IMPRESSION: 1. Rectal primary with nodal metastasis in the mesorectum and sigmoid mesocolon. 2. No distant metastasis. 3. Chronic left-sided hydronephrosis with overlying cortical thinning and decreased/absent left renal function. Favor chronic ureteropelvic junction obstruction, given absence of hydroureter. 4. 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 5. Incidental findings, including: Coronary artery atherosclerosis. Aortic Atherosclerosis (ICD10-I70.0). Cholelithiasis. Bilateral nephrolithiasis.   10/02/2023 Initial Diagnosis   Adenocarcinoma of rectum (HCC)   10/24/2023 Cancer Staging  Staging form: Colon and Rectum, AJCC 8th  Edition - Clinical stage from 10/24/2023: Stage IIIB (cT3, cN1, cM0) - Signed by Lanny Callander, MD on 10/31/2023 Histologic grade (G): G2 Histologic grading system: 4 grade system   10/29/2023 Imaging   Pelvic MRI FINDINGS: TUMOR LOCATION  Tumor distance from Anal Verge/Skin surface: 11.1 cm.  Tumor distance to Internal Anal sphincter: 7.4 cm.  TUMOR DESCRIPTION  Circumferential extent: 9 to 6 o'clock position, 75% of the total circumference, along the superior aspect.   Tumor Size and volume: 4.6 cm craniocaudal. Up to 1.9 cm in thickness.   T - CATEGORY   Extension through Muscularis Propria: Yes 1-41mm=T3b (approximately 1.6-1.8 mm.)   Shortest Distance of any tumor/node from Mesorectal fascia: 1.4 cm.   Extramural Vascular Invasion/Tumor Thrombus: No.   Invasion of Anterior Peritoneal Reflection: No.  Involvement of Adjacent Organs or Pelvic Sidewall: No.   Levator Ani Involvement: No.   N - CATEGORY   Mesorectal Lymph Nodes >=39mm: 1-3=N1   Extra-mesorectal Lymphadenopathy: No   Other: There are multiple sigmoid diverticula without imaging signs diverticulitis. Urinary bladder is decompressed. Surgically absent uterus. No large adnexal mass seen.   IMPRESSION: Rectal adenocarcinoma T stage: T3b   Rectal adenocarcinoma N stage: N1   Distance from tumor to the internal anal sphincter is 7.4 cm.       01/14/2024 - 04/20/2024 Chemotherapy   Patient is on Treatment Plan : COLORECTAL FOLFOX q14d x 4 months     05/17/2024 Imaging   Pelvic MRI WO CM rectal cancer staging IMPRESSION: 1. Please note diffusion-weighted images are nondiagnostic due to susceptibility is artifact from the left hip hardware. 2. There is focal irregular thickening along the right wall of the upper rectum as described above which may represent residual viable tumor. Correlation with sigmoidoscopy and tissue sampling is recommended. 3. In the upper rectum from 12 to 3 o'clock position there is mild focal thickening  (up to 6 mm) which is predominantly T2 dark and favored to represent treated tumor. 4. There is a stable well-circumscribed round approximately 8 x 8 mm sized perirectal lymph node. No new pathologically enlarged lymph nodes.   08/11/2024 Cancer Staging   Staging form: Colon and Rectum, AJCC 8th Edition - Pathologic stage from 08/11/2024: Stage IIIB (ypT3, ypN1c, cM0) - Signed by Lanny Callander, MD on 12/16/2024 Stage prefix: Post-therapy Total positive nodes: 0 Histologic grading system: 4 grade system Histologic grade (G): G2 Residual tumor (R): R0      Discussed the use of AI scribe software for clinical note transcription with the patient, who gave verbal consent to proceed.  History of Present Illness Samantha Jenkins is an 84 year old female with stage IIIB rectal adenocarcinoma in remission who presents for routine oncology surveillance and management of ostomy-related issues.  She is on routine hematology/oncology surveillance with a CT scan planned in the next month and port flushes every 6-8 weeks. She feels physically well.  She has persistent difficulty managing her permanent colostomy due to abdominal folds affecting stoma opening sizing. She uses a tight garment and belt, which helps, but she still has intermittent leakage, most recently two days ago. She knows the ostomy clinic and finds the nurse helpful but is limited by clinic hours. She requests specific guidance on stoma powder and wax rings for skin irritation and ostomy care and reports limited hands-on instruction during hospitalization and rehabilitation, so she is largely self-taught.  Earlier this week she had an episode of emotional distress with  about one hour of crying while processing her cancer and ostomy experience. She was able to regain composure and remains engaged in her care, actively seeking support for ostomy management and survivorship issues.  She stays involved in her cancer and ostomy care and requests ongoing  guidance on management and survivorship.  Jul 21, 2024: Follow-up for stage IIIB rectal adenocarcinoma after completion of chemoradiotherapy (Nov 03, 2023 - Dec 18, 2023) and eight cycles of FOLFOX chemotherapy (completed April 20, 2024). Persistent residual tumor noted on recent flexible sigmoidoscopy and biopsy (Jul 06, 2024); surgery recommended, molecular testing planned to assess eligibility for immunotherapy or targeted therapy. Patient experiencing chemotherapy-induced peripheral neuropathy, considering physical therapy and vitamin B12 supplementation.     All other systems were reviewed with the patient and are negative.  MEDICAL HISTORY:  Past Medical History:  Diagnosis Date   Adenocarcinoma (HCC)    Anxiety    Arthritis    knees ? 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   Chronic kidney disease    Depression    Dysthymia    FHx: cardiovascular disease    History of blood transfusion as child   History of kidney stones    Hyperlipidemia    Hypertension    LBBB (left bundle branch block) 2014   on ekg   Neuropathy    Non-functioning kidney    left kidney non functioning   Obesity    Pneumonia    hx of as a child   Rectal cancer (HCC)    Vitamin D  deficiency     SURGICAL HISTORY: Past Surgical History:  Procedure Laterality Date   ABDOMINAL HYSTERECTOMY  1986   partial   APPENDECTOMY  1986   with hysterectomy   CATARACT EXTRACTION Bilateral 2016   COLONOSCOPY     COLOSTOMY Left 08/11/2024   Procedure: CREATION, END COLOSTOMY;  Surgeon: Debby Hila, MD;  Location: [facility];  Service: General;  Laterality: Left;   CYSTOSCOPY/URETEROSCOPY/HOLMIUM LASER/STENT PLACEMENT Left 02/09/2021   Procedure: LEFT URETEROSCOPY/ LASER LITHOTRIPSY  AND  STENT PLACEMENT,ENDOPYLOTOMY;  Surgeon: Cam Morene ORN, MD;  Location: [facility];  Service: Urology;  Laterality: Left;    CYSTOSCOPY/URETEROSCOPY/HOLMIUM LASER/STENT PLACEMENT Left 03/15/2021   Procedure: CYSTOSCOPY, LEFT URETEROSCOPY, HOLMIUM LASER ENDOPYELOTOMY, URETERAL STENT PLACEMENT;  Surgeon: Cam Morene ORN, MD;  Location: [facility];  Service: Urology;  Laterality: Left;   HOLMIUM LASER APPLICATION Left 02/09/2021   Procedure: HOLMIUM LASER APPLICATION;  Surgeon: Cam Morene ORN, MD;  Location: [facility];  Service: Urology;  Laterality: Left;   KNEE ARTHROSCOPY  1995   left   NEPHROLITHOTOMY Left 03/23/2020   Procedure: LEFT NEPHROLITHOTOMY PERCUTANEOUS WITH SURGEON ACCESS;  Surgeon: Cam Morene ORN, MD;  Location: [facility];  Service: Urology;  Laterality: Left;   PORTACATH PLACEMENT N/A 01/07/2024   Procedure: PORT PLACEMENT WITH ULTRASOUND GUIDANCE;  Surgeon: Debby Hila, MD;  Location: [facility];  Service: General;  Laterality: N/A;   SIGMOIDOSCOPY     TONSILLECTOMY  1945   adenoids removed   TOTAL HIP ARTHROPLASTY Left 06/03/2023   Procedure: TOTAL HIP ARTHROPLASTY ANTERIOR APPROACH;  Surgeon: Ernie Cough, MD;  Location: [facility];  Service: Orthopedics;  Laterality: Left;   TOTAL KNEE ARTHROPLASTY Right 06/06/2020   Procedure: TOTAL KNEE ARTHROPLASTY;  Surgeon: Ernie Cough, MD;  Location: [facility];  Service: Orthopedics;  Laterality: Right;  70 mins  TOTAL KNEE ARTHROPLASTY Left 07/17/2021   Procedure: TOTAL KNEE ARTHROPLASTY;  Surgeon: Ernie Cough, MD;  Location: [facility];  Service: Orthopedics;  Laterality: Left;   TUBAL LIGATION  1979   XI ROBOTIC ASSISTED LOWER ANTERIOR RESECTION N/A 08/11/2024   Procedure: RESECTION, RECTUM, LOW ANTERIOR, ROBOT-ASSISTED;  Surgeon: Debby Hila, MD;  Location: [facility];  Service: General;  Laterality: N/A;    I have reviewed the social history and family history with the patient and they are unchanged from previous note.  ALLERGIES:  is allergic to statins and sulfa antibiotics.  MEDICATIONS:  Current Outpatient Medications   Medication Sig Dispense Refill   albuterol  (PROVENTIL  HFA;VENTOLIN  HFA) 108 (90 BASE) MCG/ACT inhaler Inhale 2 puffs into the lungs every 6 (six) hours as needed for wheezing or shortness of breath. 1 Inhaler 0   bacitracin-polymyxin b (POLYSPORIN) ointment Apply 1 application topically daily as needed (wound care).     bisoprolol -hydrochlorothiazide  (ZIAC ) 5-6.25 MG tablet TAKE 1 TABLET BY MOUTH DAILY. 90 tablet 0   diclofenac Sodium (VOLTAREN) 1 % GEL Apply 1 Application topically 2 (two) times daily as needed (pain.).     Emollient (UDDERLY SMOOTH EX) Apply 1 Application topically as needed (dry/irritated skin).     lidocaine -prilocaine  (EMLA ) cream Apply to affected area once 30 g 3   pravastatin  (PRAVACHOL ) 40 MG tablet TAKE 1 TABLET EVERY DAY (Patient taking differently: every evening.) 90 tablet 0   Propylene Glycol, PF, (SYSTANE COMPLETE PF) 0.6 % SOLN Place 1 drop into both eyes daily.     sertraline  (ZOLOFT ) 100 MG tablet TAKE 1 TABLET EVERY DAY (Patient taking differently: 150 mg every evening.) 90 tablet 0   traMADol  (ULTRAM ) 50 MG tablet Take 1-2 tablets (50-100 mg total) by mouth every 6 (six) hours as needed for moderate pain (pain score 4-6). 30 tablet 0   traZODone  (DESYREL ) 50 MG tablet Take 1 tablet (50 mg total) by mouth at bedtime as needed for sleep. 90 tablet 1   No current facility-administered medications for this visit.    PHYSICAL EXAMINATION: ECOG PERFORMANCE STATUS: 1 - Symptomatic but completely ambulatory  Vitals:   12/16/24 1000  BP: 133/70  Pulse: (!) 55  Resp: 18  Temp: 98.2 F (36.8 C)  SpO2: 100%   Wt Readings from Last 3 Encounters:  12/16/24 160 lb (72.6 kg)  09/16/24 159 lb 4.8 oz (72.3 kg)  08/16/24 169 lb 7.5 oz (76.9 kg)     GENERAL:alert, no distress and comfortable SKIN: skin color, texture, turgor are normal, no rashes or significant lesions EYES: normal, Conjunctiva are pink and non-injected, sclera clear NECK: supple, thyroid  normal size, non-tender, without nodularity LYMPH:  no palpable lymphadenopathy in the cervical, axillary  LUNGS: clear to auscultation and percussion with normal breathing effort HEART: regular rate & rhythm and no murmurs and no lower extremity edema ABDOMEN:abdomen soft, non-tender and normal bowel sounds, (+) ostomy in place  Musculoskeletal:no cyanosis of digits and no clubbing  NEURO: alert & oriented x 3 with fluent speech, no focal motor/sensory deficits  Physical Exam   LABORATORY DATA:  I have reviewed the data as listed    Latest Ref Rng & Units 12/16/2024    9:44 AM 10/28/2024   11:21 AM 09/16/2024   10:20 AM  CBC  WBC 4.0 - 10.5 K/uL 4.4  4.7  4.7   Hemoglobin 12.0 - 15.0 g/dL 87.2  87.6  88.8   Hematocrit 36.0 - 46.0 % 36.4  35.8  32.9  Platelets 150 - 400 K/uL 173  154  149         Latest Ref Rng & Units 12/16/2024    9:44 AM 10/28/2024   11:21 AM 09/16/2024   10:20 AM  CMP  Glucose 70 - 99 mg/dL 888  891  890   BUN 8 - 23 mg/dL 13  15  10    Creatinine 0.44 - 1.00 mg/dL 8.95  8.94  9.11   Sodium 135 - 145 mmol/L 141  143  143   Potassium 3.5 - 5.1 mmol/L 3.7  3.6  3.2   Chloride 98 - 111 mmol/L 104  105  108   CO2 22 - 32 mmol/L 25  26  27    Calcium  8.9 - 10.3 mg/dL 89.8  89.6  9.0   Total Protein 6.5 - 8.1 g/dL 7.3  7.3  6.4   Total Bilirubin 0.0 - 1.2 mg/dL 0.5  0.6  0.5   Alkaline Phos 38 - 126 U/L 139  123  106   AST 15 - 41 U/L 31  32  19   ALT 0 - 44 U/L 14  15  8        RADIOGRAPHIC STUDIES: I have personally reviewed the radiological images as listed and agreed with the findings in the report. No results found.    Orders Placed This Encounter  Procedures   CT CHEST ABDOMEN PELVIS W CONTRAST    Standing Status:   Future    Expected Date:   12/30/2024    Expiration Date:   12/16/2025    If indicated for the ordered procedure, I authorize the administration of contrast media per Radiology protocol:   Yes    Does the patient have a contrast  media/X-ray dye allergy?:   No    Preferred imaging location?:   [facility]    Release to patient:   Immediate    If indicated for the ordered procedure, I authorize the administration of oral contrast media per Radiology protocol:   Yes   All questions were answered. The patient knows to call the clinic with any problems, questions or concerns. No barriers to learning was detected. The total time spent in the appointment was 25 minutes, including review of chart and various tests results, discussions about plan of care and coordination of care plan     Onita Mattock, MD 12/16/2024     "

## 2025-01-04 ENCOUNTER — Ambulatory Visit (HOSPITAL_COMMUNITY)

## 2025-01-26 ENCOUNTER — Inpatient Hospital Stay: Attending: Radiation Oncology

## 2025-03-16 ENCOUNTER — Inpatient Hospital Stay: Admitting: Hematology

## 2025-03-16 ENCOUNTER — Inpatient Hospital Stay: Attending: Radiation Oncology
